# Patient Record
Sex: Male | Born: 1940 | Race: White | Hispanic: No | Marital: Married | Smoking: Never smoker
Health system: Southern US, Community
[De-identification: ages and names within clinical notes are randomized; demographics above are authoritative.]

## PROBLEM LIST (undated history)

## (undated) DIAGNOSIS — I219 Acute myocardial infarction, unspecified: Secondary | ICD-10-CM

## (undated) DIAGNOSIS — I4891 Unspecified atrial fibrillation: Secondary | ICD-10-CM

## (undated) DIAGNOSIS — B1920 Unspecified viral hepatitis C without hepatic coma: Secondary | ICD-10-CM

## (undated) DIAGNOSIS — I509 Heart failure, unspecified: Secondary | ICD-10-CM

## (undated) DIAGNOSIS — E119 Type 2 diabetes mellitus without complications: Secondary | ICD-10-CM

## (undated) DIAGNOSIS — I2510 Atherosclerotic heart disease of native coronary artery without angina pectoris: Secondary | ICD-10-CM

## (undated) DIAGNOSIS — F4310 Post-traumatic stress disorder, unspecified: Secondary | ICD-10-CM

## (undated) DIAGNOSIS — N19 Unspecified kidney failure: Secondary | ICD-10-CM

## (undated) DIAGNOSIS — Z8719 Personal history of other diseases of the digestive system: Secondary | ICD-10-CM

## (undated) DIAGNOSIS — Z95810 Presence of automatic (implantable) cardiac defibrillator: Secondary | ICD-10-CM

## (undated) DIAGNOSIS — F419 Anxiety disorder, unspecified: Secondary | ICD-10-CM

## (undated) DIAGNOSIS — B20 Human immunodeficiency virus [HIV] disease: Secondary | ICD-10-CM

## (undated) DIAGNOSIS — I10 Essential (primary) hypertension: Secondary | ICD-10-CM

## (undated) HISTORY — PX: APPENDECTOMY: SHX54

## (undated) HISTORY — DX: Unspecified atrial fibrillation: I48.91

## (undated) HISTORY — DX: Atherosclerotic heart disease of native coronary artery without angina pectoris: I25.10

## (undated) HISTORY — PX: OTHER SURGICAL HISTORY: SHX169

## (undated) HISTORY — PX: CHOLECYSTECTOMY: SHX55

## (undated) HISTORY — PX: EP IMPLANTABLE DEVICE: SHX172B

---

## 1962-10-20 HISTORY — PX: FRACTURE SURGERY: SHX138

## 1968-10-20 DIAGNOSIS — Z8719 Personal history of other diseases of the digestive system: Secondary | ICD-10-CM

## 1968-10-20 HISTORY — DX: Personal history of other diseases of the digestive system: Z87.19

## 2001-10-20 DIAGNOSIS — I219 Acute myocardial infarction, unspecified: Secondary | ICD-10-CM

## 2001-10-20 HISTORY — DX: Acute myocardial infarction, unspecified: I21.9

## 2002-04-22 ENCOUNTER — Encounter: Payer: Self-pay | Admitting: *Deleted

## 2002-04-22 ENCOUNTER — Inpatient Hospital Stay (HOSPITAL_COMMUNITY): Admission: EM | Admit: 2002-04-22 | Discharge: 2002-04-25 | Payer: Self-pay | Admitting: *Deleted

## 2002-11-27 ENCOUNTER — Emergency Department (HOSPITAL_COMMUNITY): Admission: EM | Admit: 2002-11-27 | Discharge: 2002-11-27 | Payer: Self-pay | Admitting: Emergency Medicine

## 2002-11-29 ENCOUNTER — Inpatient Hospital Stay (HOSPITAL_COMMUNITY): Admission: AD | Admit: 2002-11-29 | Discharge: 2002-12-02 | Payer: Self-pay | Admitting: Internal Medicine

## 2002-11-29 ENCOUNTER — Encounter: Payer: Self-pay | Admitting: Internal Medicine

## 2004-03-25 ENCOUNTER — Other Ambulatory Visit: Payer: Self-pay

## 2006-04-29 ENCOUNTER — Other Ambulatory Visit: Payer: Self-pay

## 2006-04-30 ENCOUNTER — Inpatient Hospital Stay: Payer: Self-pay | Admitting: Internal Medicine

## 2006-09-09 ENCOUNTER — Encounter: Payer: Self-pay | Admitting: Specialist

## 2006-09-19 ENCOUNTER — Encounter: Payer: Self-pay | Admitting: Specialist

## 2006-10-20 ENCOUNTER — Encounter: Payer: Self-pay | Admitting: Specialist

## 2006-10-22 ENCOUNTER — Ambulatory Visit: Payer: Self-pay | Admitting: Specialist

## 2006-11-20 ENCOUNTER — Encounter: Payer: Self-pay | Admitting: Specialist

## 2006-12-19 ENCOUNTER — Encounter: Payer: Self-pay | Admitting: Specialist

## 2007-01-19 ENCOUNTER — Encounter: Payer: Self-pay | Admitting: Specialist

## 2007-02-18 ENCOUNTER — Encounter: Payer: Self-pay | Admitting: Specialist

## 2007-08-25 ENCOUNTER — Encounter: Payer: Self-pay | Admitting: Internal Medicine

## 2007-09-20 ENCOUNTER — Encounter: Payer: Self-pay | Admitting: Internal Medicine

## 2007-10-21 ENCOUNTER — Encounter: Payer: Self-pay | Admitting: Internal Medicine

## 2007-11-21 ENCOUNTER — Encounter: Payer: Self-pay | Admitting: Internal Medicine

## 2007-12-19 ENCOUNTER — Encounter: Payer: Self-pay | Admitting: Internal Medicine

## 2008-01-19 ENCOUNTER — Encounter: Payer: Self-pay | Admitting: Internal Medicine

## 2008-02-18 ENCOUNTER — Encounter: Payer: Self-pay | Admitting: Internal Medicine

## 2008-03-20 ENCOUNTER — Encounter: Payer: Self-pay | Admitting: Internal Medicine

## 2008-03-28 ENCOUNTER — Ambulatory Visit: Payer: Self-pay | Admitting: Internal Medicine

## 2008-04-19 ENCOUNTER — Encounter: Payer: Self-pay | Admitting: Internal Medicine

## 2008-05-20 ENCOUNTER — Encounter: Payer: Self-pay | Admitting: Internal Medicine

## 2008-06-20 ENCOUNTER — Encounter: Payer: Self-pay | Admitting: Internal Medicine

## 2008-07-20 ENCOUNTER — Encounter: Payer: Self-pay | Admitting: Internal Medicine

## 2008-08-20 ENCOUNTER — Encounter: Payer: Self-pay | Admitting: Internal Medicine

## 2008-09-19 ENCOUNTER — Encounter: Payer: Self-pay | Admitting: Internal Medicine

## 2008-10-20 ENCOUNTER — Encounter: Payer: Self-pay | Admitting: Internal Medicine

## 2009-12-25 ENCOUNTER — Emergency Department: Payer: Self-pay | Admitting: Emergency Medicine

## 2010-01-03 ENCOUNTER — Emergency Department: Payer: Self-pay | Admitting: Emergency Medicine

## 2010-01-18 ENCOUNTER — Ambulatory Visit: Payer: Self-pay | Admitting: Cardiology

## 2010-10-24 ENCOUNTER — Ambulatory Visit: Payer: Self-pay

## 2010-12-19 ENCOUNTER — Encounter: Payer: Self-pay | Admitting: Cardiothoracic Surgery

## 2011-01-19 ENCOUNTER — Encounter: Payer: Self-pay | Admitting: Cardiothoracic Surgery

## 2011-02-18 ENCOUNTER — Encounter: Payer: Self-pay | Admitting: Cardiothoracic Surgery

## 2011-03-21 ENCOUNTER — Encounter: Payer: Self-pay | Admitting: Cardiothoracic Surgery

## 2011-04-20 ENCOUNTER — Encounter: Payer: Self-pay | Admitting: Cardiothoracic Surgery

## 2011-05-21 ENCOUNTER — Encounter: Payer: Self-pay | Admitting: Cardiothoracic Surgery

## 2011-06-20 ENCOUNTER — Encounter: Payer: Self-pay | Admitting: Nurse Practitioner

## 2011-06-21 ENCOUNTER — Encounter: Payer: Self-pay | Admitting: Cardiothoracic Surgery

## 2011-06-21 ENCOUNTER — Encounter: Payer: Self-pay | Admitting: Nurse Practitioner

## 2011-07-02 ENCOUNTER — Ambulatory Visit: Payer: Self-pay | Admitting: Nurse Practitioner

## 2011-07-07 ENCOUNTER — Inpatient Hospital Stay: Payer: Self-pay | Admitting: *Deleted

## 2011-07-21 ENCOUNTER — Encounter: Payer: Self-pay | Admitting: Cardiothoracic Surgery

## 2011-07-21 ENCOUNTER — Encounter: Payer: Self-pay | Admitting: Nurse Practitioner

## 2011-08-21 ENCOUNTER — Encounter: Payer: Self-pay | Admitting: Cardiothoracic Surgery

## 2011-09-20 ENCOUNTER — Encounter: Payer: Self-pay | Admitting: Cardiothoracic Surgery

## 2011-10-21 ENCOUNTER — Encounter: Payer: Self-pay | Admitting: Cardiothoracic Surgery

## 2011-10-21 DIAGNOSIS — I803 Phlebitis and thrombophlebitis of lower extremities, unspecified: Secondary | ICD-10-CM | POA: Diagnosis not present

## 2011-10-21 DIAGNOSIS — A4902 Methicillin resistant Staphylococcus aureus infection, unspecified site: Secondary | ICD-10-CM | POA: Diagnosis not present

## 2011-10-21 DIAGNOSIS — L97309 Non-pressure chronic ulcer of unspecified ankle with unspecified severity: Secondary | ICD-10-CM | POA: Diagnosis not present

## 2011-10-21 DIAGNOSIS — E119 Type 2 diabetes mellitus without complications: Secondary | ICD-10-CM | POA: Diagnosis not present

## 2011-10-21 DIAGNOSIS — I87319 Chronic venous hypertension (idiopathic) with ulcer of unspecified lower extremity: Secondary | ICD-10-CM | POA: Diagnosis not present

## 2011-10-23 DIAGNOSIS — I519 Heart disease, unspecified: Secondary | ICD-10-CM | POA: Diagnosis not present

## 2011-10-23 DIAGNOSIS — R002 Palpitations: Secondary | ICD-10-CM | POA: Diagnosis not present

## 2011-10-23 DIAGNOSIS — I2510 Atherosclerotic heart disease of native coronary artery without angina pectoris: Secondary | ICD-10-CM | POA: Diagnosis not present

## 2011-10-31 DIAGNOSIS — E11359 Type 2 diabetes mellitus with proliferative diabetic retinopathy without macular edema: Secondary | ICD-10-CM | POA: Diagnosis not present

## 2011-10-31 DIAGNOSIS — E1139 Type 2 diabetes mellitus with other diabetic ophthalmic complication: Secondary | ICD-10-CM | POA: Diagnosis not present

## 2011-10-31 DIAGNOSIS — E119 Type 2 diabetes mellitus without complications: Secondary | ICD-10-CM | POA: Diagnosis not present

## 2011-10-31 DIAGNOSIS — Z79899 Other long term (current) drug therapy: Secondary | ICD-10-CM | POA: Diagnosis not present

## 2011-10-31 DIAGNOSIS — I519 Heart disease, unspecified: Secondary | ICD-10-CM | POA: Diagnosis not present

## 2011-10-31 DIAGNOSIS — I4949 Other premature depolarization: Secondary | ICD-10-CM | POA: Diagnosis not present

## 2011-10-31 DIAGNOSIS — I2510 Atherosclerotic heart disease of native coronary artery without angina pectoris: Secondary | ICD-10-CM | POA: Diagnosis not present

## 2011-11-11 LAB — WOUND CULTURE

## 2011-11-21 ENCOUNTER — Encounter: Payer: Self-pay | Admitting: Cardiothoracic Surgery

## 2011-11-21 DIAGNOSIS — I803 Phlebitis and thrombophlebitis of lower extremities, unspecified: Secondary | ICD-10-CM | POA: Diagnosis not present

## 2011-11-21 DIAGNOSIS — E119 Type 2 diabetes mellitus without complications: Secondary | ICD-10-CM | POA: Diagnosis not present

## 2011-11-21 DIAGNOSIS — A4902 Methicillin resistant Staphylococcus aureus infection, unspecified site: Secondary | ICD-10-CM | POA: Diagnosis not present

## 2011-11-21 DIAGNOSIS — I87319 Chronic venous hypertension (idiopathic) with ulcer of unspecified lower extremity: Secondary | ICD-10-CM | POA: Diagnosis not present

## 2011-11-21 DIAGNOSIS — L97309 Non-pressure chronic ulcer of unspecified ankle with unspecified severity: Secondary | ICD-10-CM | POA: Diagnosis not present

## 2011-11-28 DIAGNOSIS — L97309 Non-pressure chronic ulcer of unspecified ankle with unspecified severity: Secondary | ICD-10-CM | POA: Diagnosis not present

## 2011-11-28 DIAGNOSIS — H4310 Vitreous hemorrhage, unspecified eye: Secondary | ICD-10-CM | POA: Diagnosis not present

## 2011-11-28 DIAGNOSIS — L97909 Non-pressure chronic ulcer of unspecified part of unspecified lower leg with unspecified severity: Secondary | ICD-10-CM | POA: Diagnosis not present

## 2011-11-28 DIAGNOSIS — A4902 Methicillin resistant Staphylococcus aureus infection, unspecified site: Secondary | ICD-10-CM | POA: Diagnosis not present

## 2011-11-28 DIAGNOSIS — E119 Type 2 diabetes mellitus without complications: Secondary | ICD-10-CM | POA: Diagnosis not present

## 2011-11-28 DIAGNOSIS — I87319 Chronic venous hypertension (idiopathic) with ulcer of unspecified lower extremity: Secondary | ICD-10-CM | POA: Diagnosis not present

## 2011-12-09 DIAGNOSIS — T8133XA Disruption of traumatic injury wound repair, initial encounter: Secondary | ICD-10-CM | POA: Diagnosis not present

## 2011-12-19 ENCOUNTER — Encounter: Payer: Self-pay | Admitting: Cardiothoracic Surgery

## 2011-12-19 DIAGNOSIS — E119 Type 2 diabetes mellitus without complications: Secondary | ICD-10-CM | POA: Diagnosis not present

## 2011-12-19 DIAGNOSIS — N189 Chronic kidney disease, unspecified: Secondary | ICD-10-CM | POA: Diagnosis not present

## 2011-12-19 DIAGNOSIS — A4902 Methicillin resistant Staphylococcus aureus infection, unspecified site: Secondary | ICD-10-CM | POA: Diagnosis not present

## 2011-12-19 DIAGNOSIS — L97909 Non-pressure chronic ulcer of unspecified part of unspecified lower leg with unspecified severity: Secondary | ICD-10-CM | POA: Diagnosis not present

## 2011-12-19 DIAGNOSIS — I87319 Chronic venous hypertension (idiopathic) with ulcer of unspecified lower extremity: Secondary | ICD-10-CM | POA: Diagnosis not present

## 2011-12-26 DIAGNOSIS — L97909 Non-pressure chronic ulcer of unspecified part of unspecified lower leg with unspecified severity: Secondary | ICD-10-CM | POA: Diagnosis not present

## 2011-12-26 DIAGNOSIS — I87319 Chronic venous hypertension (idiopathic) with ulcer of unspecified lower extremity: Secondary | ICD-10-CM | POA: Diagnosis not present

## 2011-12-29 DIAGNOSIS — E119 Type 2 diabetes mellitus without complications: Secondary | ICD-10-CM | POA: Diagnosis not present

## 2011-12-29 DIAGNOSIS — I517 Cardiomegaly: Secondary | ICD-10-CM | POA: Diagnosis not present

## 2011-12-29 DIAGNOSIS — I10 Essential (primary) hypertension: Secondary | ICD-10-CM | POA: Diagnosis not present

## 2012-01-02 DIAGNOSIS — E119 Type 2 diabetes mellitus without complications: Secondary | ICD-10-CM | POA: Diagnosis not present

## 2012-01-02 DIAGNOSIS — I87319 Chronic venous hypertension (idiopathic) with ulcer of unspecified lower extremity: Secondary | ICD-10-CM | POA: Diagnosis not present

## 2012-01-02 DIAGNOSIS — L97909 Non-pressure chronic ulcer of unspecified part of unspecified lower leg with unspecified severity: Secondary | ICD-10-CM | POA: Diagnosis not present

## 2012-01-02 DIAGNOSIS — N189 Chronic kidney disease, unspecified: Secondary | ICD-10-CM | POA: Diagnosis not present

## 2012-01-02 DIAGNOSIS — A4902 Methicillin resistant Staphylococcus aureus infection, unspecified site: Secondary | ICD-10-CM | POA: Diagnosis not present

## 2012-01-09 DIAGNOSIS — E11359 Type 2 diabetes mellitus with proliferative diabetic retinopathy without macular edema: Secondary | ICD-10-CM | POA: Diagnosis not present

## 2012-01-09 DIAGNOSIS — A4902 Methicillin resistant Staphylococcus aureus infection, unspecified site: Secondary | ICD-10-CM | POA: Diagnosis not present

## 2012-01-09 DIAGNOSIS — L97909 Non-pressure chronic ulcer of unspecified part of unspecified lower leg with unspecified severity: Secondary | ICD-10-CM | POA: Diagnosis not present

## 2012-01-09 DIAGNOSIS — I87319 Chronic venous hypertension (idiopathic) with ulcer of unspecified lower extremity: Secondary | ICD-10-CM | POA: Diagnosis not present

## 2012-01-09 DIAGNOSIS — E119 Type 2 diabetes mellitus without complications: Secondary | ICD-10-CM | POA: Diagnosis not present

## 2012-01-09 DIAGNOSIS — N189 Chronic kidney disease, unspecified: Secondary | ICD-10-CM | POA: Diagnosis not present

## 2012-01-09 DIAGNOSIS — E1139 Type 2 diabetes mellitus with other diabetic ophthalmic complication: Secondary | ICD-10-CM | POA: Diagnosis not present

## 2012-01-19 ENCOUNTER — Encounter: Payer: Self-pay | Admitting: Cardiothoracic Surgery

## 2012-01-19 DIAGNOSIS — E119 Type 2 diabetes mellitus without complications: Secondary | ICD-10-CM | POA: Diagnosis not present

## 2012-01-19 DIAGNOSIS — N189 Chronic kidney disease, unspecified: Secondary | ICD-10-CM | POA: Diagnosis not present

## 2012-01-19 DIAGNOSIS — I87319 Chronic venous hypertension (idiopathic) with ulcer of unspecified lower extremity: Secondary | ICD-10-CM | POA: Diagnosis not present

## 2012-01-19 DIAGNOSIS — I2510 Atherosclerotic heart disease of native coronary artery without angina pectoris: Secondary | ICD-10-CM | POA: Diagnosis not present

## 2012-01-19 DIAGNOSIS — I6529 Occlusion and stenosis of unspecified carotid artery: Secondary | ICD-10-CM | POA: Diagnosis not present

## 2012-01-19 DIAGNOSIS — I4949 Other premature depolarization: Secondary | ICD-10-CM | POA: Diagnosis not present

## 2012-01-19 DIAGNOSIS — A4902 Methicillin resistant Staphylococcus aureus infection, unspecified site: Secondary | ICD-10-CM | POA: Diagnosis not present

## 2012-01-23 DIAGNOSIS — E119 Type 2 diabetes mellitus without complications: Secondary | ICD-10-CM | POA: Diagnosis not present

## 2012-01-23 DIAGNOSIS — I87319 Chronic venous hypertension (idiopathic) with ulcer of unspecified lower extremity: Secondary | ICD-10-CM | POA: Diagnosis not present

## 2012-01-23 DIAGNOSIS — N189 Chronic kidney disease, unspecified: Secondary | ICD-10-CM | POA: Diagnosis not present

## 2012-01-23 DIAGNOSIS — A4902 Methicillin resistant Staphylococcus aureus infection, unspecified site: Secondary | ICD-10-CM | POA: Diagnosis not present

## 2012-01-23 DIAGNOSIS — L97909 Non-pressure chronic ulcer of unspecified part of unspecified lower leg with unspecified severity: Secondary | ICD-10-CM | POA: Diagnosis not present

## 2012-01-27 DIAGNOSIS — I2510 Atherosclerotic heart disease of native coronary artery without angina pectoris: Secondary | ICD-10-CM | POA: Diagnosis not present

## 2012-01-27 DIAGNOSIS — R002 Palpitations: Secondary | ICD-10-CM | POA: Diagnosis not present

## 2012-01-27 DIAGNOSIS — I519 Heart disease, unspecified: Secondary | ICD-10-CM | POA: Diagnosis not present

## 2012-01-30 DIAGNOSIS — B20 Human immunodeficiency virus [HIV] disease: Secondary | ICD-10-CM | POA: Diagnosis not present

## 2012-01-30 DIAGNOSIS — E118 Type 2 diabetes mellitus with unspecified complications: Secondary | ICD-10-CM | POA: Diagnosis not present

## 2012-01-30 DIAGNOSIS — Z79899 Other long term (current) drug therapy: Secondary | ICD-10-CM | POA: Diagnosis not present

## 2012-02-01 ENCOUNTER — Emergency Department: Payer: Self-pay | Admitting: Emergency Medicine

## 2012-02-01 DIAGNOSIS — Z7982 Long term (current) use of aspirin: Secondary | ICD-10-CM | POA: Diagnosis not present

## 2012-02-01 DIAGNOSIS — E876 Hypokalemia: Secondary | ICD-10-CM | POA: Diagnosis not present

## 2012-02-01 DIAGNOSIS — R55 Syncope and collapse: Secondary | ICD-10-CM | POA: Diagnosis not present

## 2012-02-01 DIAGNOSIS — E119 Type 2 diabetes mellitus without complications: Secondary | ICD-10-CM | POA: Diagnosis not present

## 2012-02-01 DIAGNOSIS — I252 Old myocardial infarction: Secondary | ICD-10-CM | POA: Diagnosis not present

## 2012-02-01 DIAGNOSIS — Z8673 Personal history of transient ischemic attack (TIA), and cerebral infarction without residual deficits: Secondary | ICD-10-CM | POA: Diagnosis not present

## 2012-02-01 DIAGNOSIS — E875 Hyperkalemia: Secondary | ICD-10-CM | POA: Diagnosis not present

## 2012-02-01 DIAGNOSIS — I2510 Atherosclerotic heart disease of native coronary artery without angina pectoris: Secondary | ICD-10-CM | POA: Diagnosis not present

## 2012-02-01 DIAGNOSIS — E860 Dehydration: Secondary | ICD-10-CM | POA: Diagnosis not present

## 2012-02-01 DIAGNOSIS — Z79899 Other long term (current) drug therapy: Secondary | ICD-10-CM | POA: Diagnosis not present

## 2012-02-01 DIAGNOSIS — R112 Nausea with vomiting, unspecified: Secondary | ICD-10-CM | POA: Diagnosis not present

## 2012-02-01 LAB — COMPREHENSIVE METABOLIC PANEL
Albumin: 3.5 g/dL (ref 3.4–5.0)
Alkaline Phosphatase: 130 U/L (ref 50–136)
Anion Gap: 9 (ref 7–16)
BUN: 23 mg/dL — ABNORMAL HIGH (ref 7–18)
Bilirubin,Total: 3.9 mg/dL — ABNORMAL HIGH (ref 0.2–1.0)
Chloride: 103 mmol/L (ref 98–107)
Creatinine: 1.33 mg/dL — ABNORMAL HIGH (ref 0.60–1.30)
EGFR (African American): >60
Potassium: 5.6 mmol/L — ABNORMAL HIGH (ref 3.5–5.1)
SGOT(AST): 38 U/L — ABNORMAL HIGH (ref 15–37)
Sodium: 136 mmol/L (ref 136–145)
Total Protein: 7 g/dL (ref 6.4–8.2)

## 2012-02-01 LAB — URINALYSIS, COMPLETE
Bacteria: NONE SEEN
Glucose,UR: >=500 mg/dL (ref 0–75)
Nitrite: NEGATIVE
RBC,UR: 2 /HPF (ref 0–5)
Specific Gravity: 1.014 (ref 1.003–1.030)
Squamous Epithelial: NONE SEEN

## 2012-02-01 LAB — CBC
HCT: 37.5 % — ABNORMAL LOW (ref 40.0–52.0)
HGB: 12.8 g/dL — ABNORMAL LOW (ref 13.0–18.0)
MCHC: 34.2 g/dL (ref 32.0–36.0)
MCV: 115 fL — ABNORMAL HIGH (ref 80–100)
Platelet: 97 10*3/uL — ABNORMAL LOW (ref 150–440)
RDW: 13.6 % (ref 11.5–14.5)
WBC: 7.3 10*3/uL (ref 3.8–10.6)

## 2012-02-01 LAB — TROPONIN I: Troponin-I: <0.02 ng/mL

## 2012-02-01 LAB — CK TOTAL AND CKMB (NOT AT ARMC): CK, Total: 75 U/L (ref 35–232)

## 2012-02-02 DIAGNOSIS — I2510 Atherosclerotic heart disease of native coronary artery without angina pectoris: Secondary | ICD-10-CM | POA: Diagnosis not present

## 2012-02-02 DIAGNOSIS — R55 Syncope and collapse: Secondary | ICD-10-CM | POA: Diagnosis not present

## 2012-02-02 DIAGNOSIS — I4949 Other premature depolarization: Secondary | ICD-10-CM | POA: Diagnosis not present

## 2012-02-02 DIAGNOSIS — I519 Heart disease, unspecified: Secondary | ICD-10-CM | POA: Diagnosis not present

## 2012-02-06 ENCOUNTER — Ambulatory Visit: Payer: Self-pay | Admitting: Cardiology

## 2012-02-06 DIAGNOSIS — I87319 Chronic venous hypertension (idiopathic) with ulcer of unspecified lower extremity: Secondary | ICD-10-CM | POA: Diagnosis not present

## 2012-02-06 DIAGNOSIS — R0602 Shortness of breath: Secondary | ICD-10-CM | POA: Diagnosis not present

## 2012-02-06 DIAGNOSIS — N189 Chronic kidney disease, unspecified: Secondary | ICD-10-CM | POA: Diagnosis not present

## 2012-02-06 DIAGNOSIS — L97909 Non-pressure chronic ulcer of unspecified part of unspecified lower leg with unspecified severity: Secondary | ICD-10-CM | POA: Diagnosis not present

## 2012-02-06 DIAGNOSIS — I658 Occlusion and stenosis of other precerebral arteries: Secondary | ICD-10-CM | POA: Diagnosis not present

## 2012-02-06 DIAGNOSIS — A4902 Methicillin resistant Staphylococcus aureus infection, unspecified site: Secondary | ICD-10-CM | POA: Diagnosis not present

## 2012-02-06 DIAGNOSIS — E119 Type 2 diabetes mellitus without complications: Secondary | ICD-10-CM | POA: Diagnosis not present

## 2012-02-06 DIAGNOSIS — I2510 Atherosclerotic heart disease of native coronary artery without angina pectoris: Secondary | ICD-10-CM | POA: Diagnosis not present

## 2012-02-06 DIAGNOSIS — I6529 Occlusion and stenosis of unspecified carotid artery: Secondary | ICD-10-CM | POA: Diagnosis not present

## 2012-02-10 DIAGNOSIS — E785 Hyperlipidemia, unspecified: Secondary | ICD-10-CM | POA: Diagnosis not present

## 2012-02-10 DIAGNOSIS — B20 Human immunodeficiency virus [HIV] disease: Secondary | ICD-10-CM | POA: Diagnosis not present

## 2012-02-10 DIAGNOSIS — E118 Type 2 diabetes mellitus with unspecified complications: Secondary | ICD-10-CM | POA: Diagnosis not present

## 2012-02-18 ENCOUNTER — Encounter: Payer: Self-pay | Admitting: Cardiothoracic Surgery

## 2012-02-18 DIAGNOSIS — A4902 Methicillin resistant Staphylococcus aureus infection, unspecified site: Secondary | ICD-10-CM | POA: Diagnosis not present

## 2012-02-18 DIAGNOSIS — E119 Type 2 diabetes mellitus without complications: Secondary | ICD-10-CM | POA: Diagnosis not present

## 2012-02-18 DIAGNOSIS — N189 Chronic kidney disease, unspecified: Secondary | ICD-10-CM | POA: Diagnosis not present

## 2012-02-18 DIAGNOSIS — I87319 Chronic venous hypertension (idiopathic) with ulcer of unspecified lower extremity: Secondary | ICD-10-CM | POA: Diagnosis not present

## 2012-02-20 DIAGNOSIS — A4902 Methicillin resistant Staphylococcus aureus infection, unspecified site: Secondary | ICD-10-CM | POA: Diagnosis not present

## 2012-02-20 DIAGNOSIS — L97909 Non-pressure chronic ulcer of unspecified part of unspecified lower leg with unspecified severity: Secondary | ICD-10-CM | POA: Diagnosis not present

## 2012-02-20 DIAGNOSIS — E119 Type 2 diabetes mellitus without complications: Secondary | ICD-10-CM | POA: Diagnosis not present

## 2012-02-20 DIAGNOSIS — N189 Chronic kidney disease, unspecified: Secondary | ICD-10-CM | POA: Diagnosis not present

## 2012-02-20 DIAGNOSIS — I87319 Chronic venous hypertension (idiopathic) with ulcer of unspecified lower extremity: Secondary | ICD-10-CM | POA: Diagnosis not present

## 2012-02-27 DIAGNOSIS — I87319 Chronic venous hypertension (idiopathic) with ulcer of unspecified lower extremity: Secondary | ICD-10-CM | POA: Diagnosis not present

## 2012-02-27 DIAGNOSIS — E119 Type 2 diabetes mellitus without complications: Secondary | ICD-10-CM | POA: Diagnosis not present

## 2012-02-27 DIAGNOSIS — A4902 Methicillin resistant Staphylococcus aureus infection, unspecified site: Secondary | ICD-10-CM | POA: Diagnosis not present

## 2012-02-27 DIAGNOSIS — N189 Chronic kidney disease, unspecified: Secondary | ICD-10-CM | POA: Diagnosis not present

## 2012-02-27 DIAGNOSIS — L97909 Non-pressure chronic ulcer of unspecified part of unspecified lower leg with unspecified severity: Secondary | ICD-10-CM | POA: Diagnosis not present

## 2012-03-05 DIAGNOSIS — I87319 Chronic venous hypertension (idiopathic) with ulcer of unspecified lower extremity: Secondary | ICD-10-CM | POA: Diagnosis not present

## 2012-03-05 DIAGNOSIS — E119 Type 2 diabetes mellitus without complications: Secondary | ICD-10-CM | POA: Diagnosis not present

## 2012-03-05 DIAGNOSIS — N189 Chronic kidney disease, unspecified: Secondary | ICD-10-CM | POA: Diagnosis not present

## 2012-03-05 DIAGNOSIS — L97909 Non-pressure chronic ulcer of unspecified part of unspecified lower leg with unspecified severity: Secondary | ICD-10-CM | POA: Diagnosis not present

## 2012-03-05 DIAGNOSIS — E1139 Type 2 diabetes mellitus with other diabetic ophthalmic complication: Secondary | ICD-10-CM | POA: Diagnosis not present

## 2012-03-05 DIAGNOSIS — E11359 Type 2 diabetes mellitus with proliferative diabetic retinopathy without macular edema: Secondary | ICD-10-CM | POA: Diagnosis not present

## 2012-03-05 DIAGNOSIS — A4902 Methicillin resistant Staphylococcus aureus infection, unspecified site: Secondary | ICD-10-CM | POA: Diagnosis not present

## 2012-03-10 DIAGNOSIS — H2510 Age-related nuclear cataract, unspecified eye: Secondary | ICD-10-CM | POA: Diagnosis not present

## 2012-03-12 DIAGNOSIS — I87319 Chronic venous hypertension (idiopathic) with ulcer of unspecified lower extremity: Secondary | ICD-10-CM | POA: Diagnosis not present

## 2012-03-12 DIAGNOSIS — A4902 Methicillin resistant Staphylococcus aureus infection, unspecified site: Secondary | ICD-10-CM | POA: Diagnosis not present

## 2012-03-12 DIAGNOSIS — E119 Type 2 diabetes mellitus without complications: Secondary | ICD-10-CM | POA: Diagnosis not present

## 2012-03-12 DIAGNOSIS — N189 Chronic kidney disease, unspecified: Secondary | ICD-10-CM | POA: Diagnosis not present

## 2012-03-17 LAB — WOUND CULTURE

## 2012-03-20 ENCOUNTER — Encounter: Payer: Self-pay | Admitting: Cardiothoracic Surgery

## 2012-03-20 DIAGNOSIS — A4902 Methicillin resistant Staphylococcus aureus infection, unspecified site: Secondary | ICD-10-CM | POA: Diagnosis not present

## 2012-03-20 DIAGNOSIS — E119 Type 2 diabetes mellitus without complications: Secondary | ICD-10-CM | POA: Diagnosis not present

## 2012-03-20 DIAGNOSIS — N189 Chronic kidney disease, unspecified: Secondary | ICD-10-CM | POA: Diagnosis not present

## 2012-03-20 DIAGNOSIS — I87319 Chronic venous hypertension (idiopathic) with ulcer of unspecified lower extremity: Secondary | ICD-10-CM | POA: Diagnosis not present

## 2012-04-02 DIAGNOSIS — E119 Type 2 diabetes mellitus without complications: Secondary | ICD-10-CM | POA: Diagnosis not present

## 2012-04-02 DIAGNOSIS — A4902 Methicillin resistant Staphylococcus aureus infection, unspecified site: Secondary | ICD-10-CM | POA: Diagnosis not present

## 2012-04-02 DIAGNOSIS — I87319 Chronic venous hypertension (idiopathic) with ulcer of unspecified lower extremity: Secondary | ICD-10-CM | POA: Diagnosis not present

## 2012-04-02 DIAGNOSIS — L97909 Non-pressure chronic ulcer of unspecified part of unspecified lower leg with unspecified severity: Secondary | ICD-10-CM | POA: Diagnosis not present

## 2012-04-02 DIAGNOSIS — N189 Chronic kidney disease, unspecified: Secondary | ICD-10-CM | POA: Diagnosis not present

## 2012-04-09 DIAGNOSIS — E119 Type 2 diabetes mellitus without complications: Secondary | ICD-10-CM | POA: Diagnosis not present

## 2012-04-09 DIAGNOSIS — I87319 Chronic venous hypertension (idiopathic) with ulcer of unspecified lower extremity: Secondary | ICD-10-CM | POA: Diagnosis not present

## 2012-04-09 DIAGNOSIS — N189 Chronic kidney disease, unspecified: Secondary | ICD-10-CM | POA: Diagnosis not present

## 2012-04-09 DIAGNOSIS — L97909 Non-pressure chronic ulcer of unspecified part of unspecified lower leg with unspecified severity: Secondary | ICD-10-CM | POA: Diagnosis not present

## 2012-04-09 DIAGNOSIS — A4902 Methicillin resistant Staphylococcus aureus infection, unspecified site: Secondary | ICD-10-CM | POA: Diagnosis not present

## 2012-04-19 ENCOUNTER — Encounter: Payer: Self-pay | Admitting: Cardiothoracic Surgery

## 2012-04-19 DIAGNOSIS — A4902 Methicillin resistant Staphylococcus aureus infection, unspecified site: Secondary | ICD-10-CM | POA: Diagnosis not present

## 2012-04-19 DIAGNOSIS — E119 Type 2 diabetes mellitus without complications: Secondary | ICD-10-CM | POA: Diagnosis not present

## 2012-04-19 DIAGNOSIS — I87319 Chronic venous hypertension (idiopathic) with ulcer of unspecified lower extremity: Secondary | ICD-10-CM | POA: Diagnosis not present

## 2012-04-19 DIAGNOSIS — N189 Chronic kidney disease, unspecified: Secondary | ICD-10-CM | POA: Diagnosis not present

## 2012-04-26 DIAGNOSIS — I10 Essential (primary) hypertension: Secondary | ICD-10-CM | POA: Diagnosis not present

## 2012-04-26 DIAGNOSIS — E119 Type 2 diabetes mellitus without complications: Secondary | ICD-10-CM | POA: Diagnosis not present

## 2012-04-30 ENCOUNTER — Encounter: Payer: Self-pay | Admitting: Nurse Practitioner

## 2012-04-30 DIAGNOSIS — I87319 Chronic venous hypertension (idiopathic) with ulcer of unspecified lower extremity: Secondary | ICD-10-CM | POA: Diagnosis not present

## 2012-04-30 DIAGNOSIS — L97909 Non-pressure chronic ulcer of unspecified part of unspecified lower leg with unspecified severity: Secondary | ICD-10-CM | POA: Diagnosis not present

## 2012-04-30 DIAGNOSIS — E119 Type 2 diabetes mellitus without complications: Secondary | ICD-10-CM | POA: Diagnosis not present

## 2012-04-30 DIAGNOSIS — A4902 Methicillin resistant Staphylococcus aureus infection, unspecified site: Secondary | ICD-10-CM | POA: Diagnosis not present

## 2012-04-30 DIAGNOSIS — N189 Chronic kidney disease, unspecified: Secondary | ICD-10-CM | POA: Diagnosis not present

## 2012-05-04 LAB — WOUND CULTURE

## 2012-05-07 DIAGNOSIS — I2510 Atherosclerotic heart disease of native coronary artery without angina pectoris: Secondary | ICD-10-CM | POA: Diagnosis not present

## 2012-05-07 DIAGNOSIS — I4949 Other premature depolarization: Secondary | ICD-10-CM | POA: Diagnosis not present

## 2012-05-07 DIAGNOSIS — I519 Heart disease, unspecified: Secondary | ICD-10-CM | POA: Diagnosis not present

## 2012-05-12 DIAGNOSIS — S91009A Unspecified open wound, unspecified ankle, initial encounter: Secondary | ICD-10-CM | POA: Diagnosis not present

## 2012-05-12 DIAGNOSIS — S81009A Unspecified open wound, unspecified knee, initial encounter: Secondary | ICD-10-CM | POA: Diagnosis not present

## 2012-05-14 DIAGNOSIS — E119 Type 2 diabetes mellitus without complications: Secondary | ICD-10-CM | POA: Diagnosis not present

## 2012-05-14 DIAGNOSIS — N189 Chronic kidney disease, unspecified: Secondary | ICD-10-CM | POA: Diagnosis not present

## 2012-05-14 DIAGNOSIS — A4902 Methicillin resistant Staphylococcus aureus infection, unspecified site: Secondary | ICD-10-CM | POA: Diagnosis not present

## 2012-05-14 DIAGNOSIS — I87319 Chronic venous hypertension (idiopathic) with ulcer of unspecified lower extremity: Secondary | ICD-10-CM | POA: Diagnosis not present

## 2012-05-14 DIAGNOSIS — L97909 Non-pressure chronic ulcer of unspecified part of unspecified lower leg with unspecified severity: Secondary | ICD-10-CM | POA: Diagnosis not present

## 2012-05-20 ENCOUNTER — Encounter: Payer: Self-pay | Admitting: Cardiothoracic Surgery

## 2012-05-20 ENCOUNTER — Encounter: Payer: Self-pay | Admitting: Nurse Practitioner

## 2012-05-20 DIAGNOSIS — A4902 Methicillin resistant Staphylococcus aureus infection, unspecified site: Secondary | ICD-10-CM | POA: Diagnosis not present

## 2012-05-20 DIAGNOSIS — N189 Chronic kidney disease, unspecified: Secondary | ICD-10-CM | POA: Diagnosis not present

## 2012-05-20 DIAGNOSIS — E119 Type 2 diabetes mellitus without complications: Secondary | ICD-10-CM | POA: Diagnosis not present

## 2012-05-20 DIAGNOSIS — I87319 Chronic venous hypertension (idiopathic) with ulcer of unspecified lower extremity: Secondary | ICD-10-CM | POA: Diagnosis not present

## 2012-05-26 DIAGNOSIS — S81809A Unspecified open wound, unspecified lower leg, initial encounter: Secondary | ICD-10-CM | POA: Diagnosis not present

## 2012-05-26 DIAGNOSIS — L97309 Non-pressure chronic ulcer of unspecified ankle with unspecified severity: Secondary | ICD-10-CM | POA: Diagnosis not present

## 2012-05-26 DIAGNOSIS — S81009A Unspecified open wound, unspecified knee, initial encounter: Secondary | ICD-10-CM | POA: Diagnosis not present

## 2012-05-26 DIAGNOSIS — S91009A Unspecified open wound, unspecified ankle, initial encounter: Secondary | ICD-10-CM | POA: Diagnosis not present

## 2012-05-26 DIAGNOSIS — R229 Localized swelling, mass and lump, unspecified: Secondary | ICD-10-CM | POA: Diagnosis not present

## 2012-05-27 DIAGNOSIS — R0989 Other specified symptoms and signs involving the circulatory and respiratory systems: Secondary | ICD-10-CM | POA: Diagnosis not present

## 2012-05-27 DIAGNOSIS — R002 Palpitations: Secondary | ICD-10-CM | POA: Diagnosis not present

## 2012-05-28 DIAGNOSIS — E119 Type 2 diabetes mellitus without complications: Secondary | ICD-10-CM | POA: Diagnosis not present

## 2012-05-28 DIAGNOSIS — I87319 Chronic venous hypertension (idiopathic) with ulcer of unspecified lower extremity: Secondary | ICD-10-CM | POA: Diagnosis not present

## 2012-05-28 DIAGNOSIS — N189 Chronic kidney disease, unspecified: Secondary | ICD-10-CM | POA: Diagnosis not present

## 2012-05-28 DIAGNOSIS — I2510 Atherosclerotic heart disease of native coronary artery without angina pectoris: Secondary | ICD-10-CM | POA: Diagnosis not present

## 2012-05-28 DIAGNOSIS — I519 Heart disease, unspecified: Secondary | ICD-10-CM | POA: Diagnosis not present

## 2012-05-28 DIAGNOSIS — L97909 Non-pressure chronic ulcer of unspecified part of unspecified lower leg with unspecified severity: Secondary | ICD-10-CM | POA: Diagnosis not present

## 2012-05-28 DIAGNOSIS — I4949 Other premature depolarization: Secondary | ICD-10-CM | POA: Diagnosis not present

## 2012-05-28 DIAGNOSIS — A4902 Methicillin resistant Staphylococcus aureus infection, unspecified site: Secondary | ICD-10-CM | POA: Diagnosis not present

## 2012-06-15 DIAGNOSIS — E1139 Type 2 diabetes mellitus with other diabetic ophthalmic complication: Secondary | ICD-10-CM | POA: Diagnosis not present

## 2012-06-15 DIAGNOSIS — E11359 Type 2 diabetes mellitus with proliferative diabetic retinopathy without macular edema: Secondary | ICD-10-CM | POA: Diagnosis not present

## 2012-06-18 DIAGNOSIS — N189 Chronic kidney disease, unspecified: Secondary | ICD-10-CM | POA: Diagnosis not present

## 2012-06-18 DIAGNOSIS — L97909 Non-pressure chronic ulcer of unspecified part of unspecified lower leg with unspecified severity: Secondary | ICD-10-CM | POA: Diagnosis not present

## 2012-06-18 DIAGNOSIS — E119 Type 2 diabetes mellitus without complications: Secondary | ICD-10-CM | POA: Diagnosis not present

## 2012-06-18 DIAGNOSIS — I87319 Chronic venous hypertension (idiopathic) with ulcer of unspecified lower extremity: Secondary | ICD-10-CM | POA: Diagnosis not present

## 2012-06-18 DIAGNOSIS — A4902 Methicillin resistant Staphylococcus aureus infection, unspecified site: Secondary | ICD-10-CM | POA: Diagnosis not present

## 2012-06-20 ENCOUNTER — Encounter: Payer: Self-pay | Admitting: Cardiothoracic Surgery

## 2012-06-20 DIAGNOSIS — A4902 Methicillin resistant Staphylococcus aureus infection, unspecified site: Secondary | ICD-10-CM | POA: Diagnosis not present

## 2012-06-20 DIAGNOSIS — I87319 Chronic venous hypertension (idiopathic) with ulcer of unspecified lower extremity: Secondary | ICD-10-CM | POA: Diagnosis not present

## 2012-06-20 DIAGNOSIS — E119 Type 2 diabetes mellitus without complications: Secondary | ICD-10-CM | POA: Diagnosis not present

## 2012-06-20 DIAGNOSIS — N189 Chronic kidney disease, unspecified: Secondary | ICD-10-CM | POA: Diagnosis not present

## 2012-06-23 DIAGNOSIS — R9431 Abnormal electrocardiogram [ECG] [EKG]: Secondary | ICD-10-CM | POA: Diagnosis not present

## 2012-06-23 DIAGNOSIS — R7989 Other specified abnormal findings of blood chemistry: Secondary | ICD-10-CM | POA: Diagnosis not present

## 2012-06-23 DIAGNOSIS — Z01818 Encounter for other preprocedural examination: Secondary | ICD-10-CM | POA: Diagnosis not present

## 2012-06-23 DIAGNOSIS — I498 Other specified cardiac arrhythmias: Secondary | ICD-10-CM | POA: Diagnosis not present

## 2012-06-23 DIAGNOSIS — Z01812 Encounter for preprocedural laboratory examination: Secondary | ICD-10-CM | POA: Diagnosis not present

## 2012-06-23 DIAGNOSIS — M629 Disorder of muscle, unspecified: Secondary | ICD-10-CM | POA: Diagnosis not present

## 2012-07-20 ENCOUNTER — Encounter: Payer: Self-pay | Admitting: Cardiothoracic Surgery

## 2012-07-20 ENCOUNTER — Encounter: Payer: Self-pay | Admitting: Nurse Practitioner

## 2012-07-20 DIAGNOSIS — N189 Chronic kidney disease, unspecified: Secondary | ICD-10-CM | POA: Diagnosis not present

## 2012-07-20 DIAGNOSIS — A4902 Methicillin resistant Staphylococcus aureus infection, unspecified site: Secondary | ICD-10-CM | POA: Diagnosis not present

## 2012-07-20 DIAGNOSIS — E119 Type 2 diabetes mellitus without complications: Secondary | ICD-10-CM | POA: Diagnosis not present

## 2012-07-20 DIAGNOSIS — I87319 Chronic venous hypertension (idiopathic) with ulcer of unspecified lower extremity: Secondary | ICD-10-CM | POA: Diagnosis not present

## 2012-07-27 DIAGNOSIS — I517 Cardiomegaly: Secondary | ICD-10-CM | POA: Diagnosis not present

## 2012-07-27 DIAGNOSIS — I10 Essential (primary) hypertension: Secondary | ICD-10-CM | POA: Diagnosis not present

## 2012-07-27 DIAGNOSIS — E119 Type 2 diabetes mellitus without complications: Secondary | ICD-10-CM | POA: Diagnosis not present

## 2012-07-27 DIAGNOSIS — J00 Acute nasopharyngitis [common cold]: Secondary | ICD-10-CM | POA: Diagnosis not present

## 2012-07-30 ENCOUNTER — Observation Stay: Payer: Self-pay | Admitting: Internal Medicine

## 2012-07-30 DIAGNOSIS — R0789 Other chest pain: Secondary | ICD-10-CM | POA: Diagnosis not present

## 2012-07-30 DIAGNOSIS — N183 Chronic kidney disease, stage 3 unspecified: Secondary | ICD-10-CM | POA: Diagnosis not present

## 2012-07-30 DIAGNOSIS — Z9089 Acquired absence of other organs: Secondary | ICD-10-CM | POA: Diagnosis not present

## 2012-07-30 DIAGNOSIS — I2510 Atherosclerotic heart disease of native coronary artery without angina pectoris: Secondary | ICD-10-CM | POA: Diagnosis not present

## 2012-07-30 DIAGNOSIS — R52 Pain, unspecified: Secondary | ICD-10-CM | POA: Diagnosis not present

## 2012-07-30 DIAGNOSIS — L97509 Non-pressure chronic ulcer of other part of unspecified foot with unspecified severity: Secondary | ICD-10-CM | POA: Diagnosis not present

## 2012-07-30 DIAGNOSIS — Z7982 Long term (current) use of aspirin: Secondary | ICD-10-CM | POA: Diagnosis not present

## 2012-07-30 DIAGNOSIS — I509 Heart failure, unspecified: Secondary | ICD-10-CM | POA: Diagnosis not present

## 2012-07-30 DIAGNOSIS — R079 Chest pain, unspecified: Secondary | ICD-10-CM | POA: Diagnosis not present

## 2012-07-30 DIAGNOSIS — E875 Hyperkalemia: Secondary | ICD-10-CM | POA: Diagnosis not present

## 2012-07-30 DIAGNOSIS — T889XXS Complication of surgical and medical care, unspecified, sequela: Secondary | ICD-10-CM | POA: Diagnosis not present

## 2012-07-30 DIAGNOSIS — I129 Hypertensive chronic kidney disease with stage 1 through stage 4 chronic kidney disease, or unspecified chronic kidney disease: Secondary | ICD-10-CM | POA: Diagnosis not present

## 2012-07-30 DIAGNOSIS — B20 Human immunodeficiency virus [HIV] disease: Secondary | ICD-10-CM | POA: Diagnosis not present

## 2012-07-30 DIAGNOSIS — Z7902 Long term (current) use of antithrombotics/antiplatelets: Secondary | ICD-10-CM | POA: Diagnosis not present

## 2012-07-30 DIAGNOSIS — J209 Acute bronchitis, unspecified: Secondary | ICD-10-CM | POA: Diagnosis not present

## 2012-07-30 DIAGNOSIS — Z87891 Personal history of nicotine dependence: Secondary | ICD-10-CM | POA: Diagnosis not present

## 2012-07-30 DIAGNOSIS — E119 Type 2 diabetes mellitus without complications: Secondary | ICD-10-CM | POA: Diagnosis not present

## 2012-07-30 DIAGNOSIS — R112 Nausea with vomiting, unspecified: Secondary | ICD-10-CM | POA: Diagnosis not present

## 2012-07-30 DIAGNOSIS — I5022 Chronic systolic (congestive) heart failure: Secondary | ICD-10-CM | POA: Diagnosis not present

## 2012-07-30 LAB — COMPREHENSIVE METABOLIC PANEL
Albumin: 3.1 g/dL — ABNORMAL LOW (ref 3.4–5.0)
Anion Gap: 7 (ref 7–16)
BUN: 17 mg/dL (ref 7–18)
Bilirubin,Total: 3.2 mg/dL — ABNORMAL HIGH (ref 0.2–1.0)
Chloride: 105 mmol/L (ref 98–107)
Co2: 25 mmol/L (ref 21–32)
EGFR (African American): 60 — ABNORMAL LOW
EGFR (Non-African Amer.): 52 — ABNORMAL LOW
Glucose: 216 mg/dL — ABNORMAL HIGH (ref 65–99)
Potassium: 5.3 mmol/L — ABNORMAL HIGH (ref 3.5–5.1)
SGOT(AST): 20 U/L (ref 15–37)
SGPT (ALT): 31 U/L (ref 12–78)
Sodium: 137 mmol/L (ref 136–145)
Total Protein: 6.8 g/dL (ref 6.4–8.2)

## 2012-07-30 LAB — CBC
MCH: 40.2 pg — ABNORMAL HIGH (ref 26.0–34.0)
MCHC: 35.8 g/dL (ref 32.0–36.0)
Platelet: 85 10*3/uL — ABNORMAL LOW (ref 150–440)
RBC: 3.11 10*6/uL — ABNORMAL LOW (ref 4.40–5.90)

## 2012-07-30 LAB — TROPONIN I
Troponin-I: <0.02 ng/mL
Troponin-I: <0.02 ng/mL
Troponin-I: <0.02 ng/mL

## 2012-07-30 LAB — CK TOTAL AND CKMB (NOT AT ARMC): CK-MB: 1.1 ng/mL (ref 0.5–3.6)

## 2012-07-30 LAB — POTASSIUM: Potassium: 5.3 mmol/L — ABNORMAL HIGH (ref 3.5–5.1)

## 2012-07-31 DIAGNOSIS — J209 Acute bronchitis, unspecified: Secondary | ICD-10-CM | POA: Diagnosis not present

## 2012-07-31 DIAGNOSIS — R079 Chest pain, unspecified: Secondary | ICD-10-CM | POA: Diagnosis not present

## 2012-07-31 DIAGNOSIS — E875 Hyperkalemia: Secondary | ICD-10-CM | POA: Diagnosis not present

## 2012-07-31 DIAGNOSIS — I5022 Chronic systolic (congestive) heart failure: Secondary | ICD-10-CM | POA: Diagnosis not present

## 2012-08-01 DIAGNOSIS — I509 Heart failure, unspecified: Secondary | ICD-10-CM | POA: Diagnosis not present

## 2012-08-02 DIAGNOSIS — A4902 Methicillin resistant Staphylococcus aureus infection, unspecified site: Secondary | ICD-10-CM | POA: Diagnosis not present

## 2012-08-02 DIAGNOSIS — I87319 Chronic venous hypertension (idiopathic) with ulcer of unspecified lower extremity: Secondary | ICD-10-CM | POA: Diagnosis not present

## 2012-08-02 DIAGNOSIS — E119 Type 2 diabetes mellitus without complications: Secondary | ICD-10-CM | POA: Diagnosis not present

## 2012-08-02 DIAGNOSIS — N189 Chronic kidney disease, unspecified: Secondary | ICD-10-CM | POA: Diagnosis not present

## 2012-08-04 DIAGNOSIS — E118 Type 2 diabetes mellitus with unspecified complications: Secondary | ICD-10-CM | POA: Diagnosis not present

## 2012-08-04 DIAGNOSIS — Z79899 Other long term (current) drug therapy: Secondary | ICD-10-CM | POA: Diagnosis not present

## 2012-08-04 DIAGNOSIS — B182 Chronic viral hepatitis C: Secondary | ICD-10-CM | POA: Diagnosis not present

## 2012-08-04 DIAGNOSIS — B20 Human immunodeficiency virus [HIV] disease: Secondary | ICD-10-CM | POA: Diagnosis not present

## 2012-08-06 ENCOUNTER — Ambulatory Visit: Payer: Self-pay

## 2012-08-06 DIAGNOSIS — E119 Type 2 diabetes mellitus without complications: Secondary | ICD-10-CM | POA: Diagnosis not present

## 2012-08-06 DIAGNOSIS — B20 Human immunodeficiency virus [HIV] disease: Secondary | ICD-10-CM | POA: Diagnosis not present

## 2012-08-06 LAB — CBC WITH DIFFERENTIAL/PLATELET
Comment - H1-Com3: NORMAL
HCT: 34.8 % — ABNORMAL LOW (ref 40.0–52.0)
HGB: 12.5 g/dL — ABNORMAL LOW (ref 13.0–18.0)
MCH: 40.2 pg — ABNORMAL HIGH (ref 26.0–34.0)
MCHC: 36 g/dL (ref 32.0–36.0)
MCV: 112 fL — ABNORMAL HIGH (ref 80–100)
Monocytes: 5 %
Platelet: 149 10*3/uL — ABNORMAL LOW (ref 150–440)
RBC: 3.12 10*6/uL — ABNORMAL LOW (ref 4.40–5.90)
RDW: 13.1 % (ref 11.5–14.5)
Variant Lymphocyte - H1-Rlymph: 1 %

## 2012-08-06 LAB — COMPREHENSIVE METABOLIC PANEL
Anion Gap: 8 (ref 7–16)
BUN: 41 mg/dL — ABNORMAL HIGH (ref 7–18)
Bilirubin,Total: 1.5 mg/dL — ABNORMAL HIGH (ref 0.2–1.0)
Calcium, Total: 8.3 mg/dL — ABNORMAL LOW (ref 8.5–10.1)
Chloride: 103 mmol/L (ref 98–107)
Co2: 26 mmol/L (ref 21–32)
Creatinine: 1.58 mg/dL — ABNORMAL HIGH (ref 0.60–1.30)
EGFR (Non-African Amer.): 44 — ABNORMAL LOW
Osmolality: 295 (ref 275–301)
Potassium: 5.1 mmol/L (ref 3.5–5.1)
SGPT (ALT): 24 U/L (ref 12–78)
Sodium: 137 mmol/L (ref 136–145)

## 2012-08-10 DIAGNOSIS — E118 Type 2 diabetes mellitus with unspecified complications: Secondary | ICD-10-CM | POA: Diagnosis not present

## 2012-08-10 DIAGNOSIS — E785 Hyperlipidemia, unspecified: Secondary | ICD-10-CM | POA: Diagnosis not present

## 2012-08-10 DIAGNOSIS — B20 Human immunodeficiency virus [HIV] disease: Secondary | ICD-10-CM | POA: Diagnosis not present

## 2012-08-14 ENCOUNTER — Emergency Department: Payer: Self-pay | Admitting: Emergency Medicine

## 2012-08-14 DIAGNOSIS — Z79899 Other long term (current) drug therapy: Secondary | ICD-10-CM | POA: Diagnosis not present

## 2012-08-14 DIAGNOSIS — I2510 Atherosclerotic heart disease of native coronary artery without angina pectoris: Secondary | ICD-10-CM | POA: Diagnosis not present

## 2012-08-14 DIAGNOSIS — R002 Palpitations: Secondary | ICD-10-CM | POA: Diagnosis not present

## 2012-08-14 DIAGNOSIS — I4891 Unspecified atrial fibrillation: Secondary | ICD-10-CM | POA: Diagnosis not present

## 2012-08-14 DIAGNOSIS — I509 Heart failure, unspecified: Secondary | ICD-10-CM | POA: Diagnosis not present

## 2012-08-14 DIAGNOSIS — Z87891 Personal history of nicotine dependence: Secondary | ICD-10-CM | POA: Diagnosis not present

## 2012-08-14 DIAGNOSIS — E119 Type 2 diabetes mellitus without complications: Secondary | ICD-10-CM | POA: Diagnosis not present

## 2012-08-14 DIAGNOSIS — Z21 Asymptomatic human immunodeficiency virus [HIV] infection status: Secondary | ICD-10-CM | POA: Diagnosis not present

## 2012-08-14 LAB — COMPREHENSIVE METABOLIC PANEL
Albumin: 3.1 g/dL — ABNORMAL LOW (ref 3.4–5.0)
Alkaline Phosphatase: 115 U/L (ref 50–136)
Bilirubin,Total: 2.1 mg/dL — ABNORMAL HIGH (ref 0.2–1.0)
Calcium, Total: 8.4 mg/dL — ABNORMAL LOW (ref 8.5–10.1)
Chloride: 104 mmol/L (ref 98–107)
Co2: 24 mmol/L (ref 21–32)
EGFR (African American): 47 — ABNORMAL LOW
Glucose: 347 mg/dL — ABNORMAL HIGH (ref 65–99)
Osmolality: 293 (ref 275–301)
SGOT(AST): 24 U/L (ref 15–37)
SGPT (ALT): 29 U/L (ref 12–78)
Sodium: 136 mmol/L (ref 136–145)

## 2012-08-14 LAB — CK TOTAL AND CKMB (NOT AT ARMC): CK, Total: 55 U/L (ref 35–232)

## 2012-08-14 LAB — CBC
HCT: 36.7 % — ABNORMAL LOW (ref 40.0–52.0)
HGB: 12.8 g/dL — ABNORMAL LOW (ref 13.0–18.0)
MCH: 39.4 pg — ABNORMAL HIGH (ref 26.0–34.0)
Platelet: 91 10*3/uL — ABNORMAL LOW (ref 150–440)
RBC: 3.26 10*6/uL — ABNORMAL LOW (ref 4.40–5.90)
WBC: 7.4 10*3/uL (ref 3.8–10.6)

## 2012-08-14 LAB — MAGNESIUM: Magnesium: 2.2 mg/dL

## 2012-08-14 LAB — TROPONIN I: Troponin-I: 0.02 ng/mL

## 2012-08-16 DIAGNOSIS — I519 Heart disease, unspecified: Secondary | ICD-10-CM | POA: Diagnosis not present

## 2012-08-16 DIAGNOSIS — I4949 Other premature depolarization: Secondary | ICD-10-CM | POA: Diagnosis not present

## 2012-08-16 DIAGNOSIS — I2510 Atherosclerotic heart disease of native coronary artery without angina pectoris: Secondary | ICD-10-CM | POA: Diagnosis not present

## 2012-08-16 DIAGNOSIS — I4891 Unspecified atrial fibrillation: Secondary | ICD-10-CM | POA: Diagnosis not present

## 2012-08-18 DIAGNOSIS — I4891 Unspecified atrial fibrillation: Secondary | ICD-10-CM | POA: Diagnosis not present

## 2012-08-20 ENCOUNTER — Encounter: Payer: Self-pay | Admitting: Cardiothoracic Surgery

## 2012-08-20 ENCOUNTER — Encounter: Payer: Self-pay | Admitting: Nurse Practitioner

## 2012-08-20 DIAGNOSIS — A4902 Methicillin resistant Staphylococcus aureus infection, unspecified site: Secondary | ICD-10-CM | POA: Diagnosis not present

## 2012-08-20 DIAGNOSIS — N189 Chronic kidney disease, unspecified: Secondary | ICD-10-CM | POA: Diagnosis not present

## 2012-08-20 DIAGNOSIS — I83009 Varicose veins of unspecified lower extremity with ulcer of unspecified site: Secondary | ICD-10-CM | POA: Diagnosis not present

## 2012-08-20 DIAGNOSIS — L97209 Non-pressure chronic ulcer of unspecified calf with unspecified severity: Secondary | ICD-10-CM | POA: Diagnosis not present

## 2012-08-20 DIAGNOSIS — E119 Type 2 diabetes mellitus without complications: Secondary | ICD-10-CM | POA: Diagnosis not present

## 2012-08-20 DIAGNOSIS — I87319 Chronic venous hypertension (idiopathic) with ulcer of unspecified lower extremity: Secondary | ICD-10-CM | POA: Diagnosis not present

## 2012-09-02 DIAGNOSIS — I4891 Unspecified atrial fibrillation: Secondary | ICD-10-CM | POA: Diagnosis not present

## 2012-09-02 DIAGNOSIS — I2510 Atherosclerotic heart disease of native coronary artery without angina pectoris: Secondary | ICD-10-CM | POA: Diagnosis not present

## 2012-09-02 DIAGNOSIS — I519 Heart disease, unspecified: Secondary | ICD-10-CM | POA: Diagnosis not present

## 2012-09-02 DIAGNOSIS — I4949 Other premature depolarization: Secondary | ICD-10-CM | POA: Diagnosis not present

## 2012-09-03 DIAGNOSIS — L97909 Non-pressure chronic ulcer of unspecified part of unspecified lower leg with unspecified severity: Secondary | ICD-10-CM | POA: Diagnosis not present

## 2012-09-03 DIAGNOSIS — N189 Chronic kidney disease, unspecified: Secondary | ICD-10-CM | POA: Diagnosis not present

## 2012-09-03 DIAGNOSIS — E119 Type 2 diabetes mellitus without complications: Secondary | ICD-10-CM | POA: Diagnosis not present

## 2012-09-03 DIAGNOSIS — A4902 Methicillin resistant Staphylococcus aureus infection, unspecified site: Secondary | ICD-10-CM | POA: Diagnosis not present

## 2012-09-03 DIAGNOSIS — I87319 Chronic venous hypertension (idiopathic) with ulcer of unspecified lower extremity: Secondary | ICD-10-CM | POA: Diagnosis not present

## 2012-09-13 DIAGNOSIS — I83009 Varicose veins of unspecified lower extremity with ulcer of unspecified site: Secondary | ICD-10-CM | POA: Diagnosis not present

## 2012-09-13 DIAGNOSIS — N189 Chronic kidney disease, unspecified: Secondary | ICD-10-CM | POA: Diagnosis not present

## 2012-09-13 DIAGNOSIS — A4902 Methicillin resistant Staphylococcus aureus infection, unspecified site: Secondary | ICD-10-CM | POA: Diagnosis not present

## 2012-09-13 DIAGNOSIS — L97209 Non-pressure chronic ulcer of unspecified calf with unspecified severity: Secondary | ICD-10-CM | POA: Diagnosis not present

## 2012-09-13 DIAGNOSIS — E119 Type 2 diabetes mellitus without complications: Secondary | ICD-10-CM | POA: Diagnosis not present

## 2012-09-13 DIAGNOSIS — L97909 Non-pressure chronic ulcer of unspecified part of unspecified lower leg with unspecified severity: Secondary | ICD-10-CM | POA: Diagnosis not present

## 2012-09-13 DIAGNOSIS — I87319 Chronic venous hypertension (idiopathic) with ulcer of unspecified lower extremity: Secondary | ICD-10-CM | POA: Diagnosis not present

## 2012-09-19 ENCOUNTER — Encounter: Payer: Self-pay | Admitting: Cardiothoracic Surgery

## 2012-09-19 ENCOUNTER — Encounter: Payer: Self-pay | Admitting: Nurse Practitioner

## 2012-09-19 DIAGNOSIS — A4902 Methicillin resistant Staphylococcus aureus infection, unspecified site: Secondary | ICD-10-CM | POA: Diagnosis not present

## 2012-09-19 DIAGNOSIS — I87319 Chronic venous hypertension (idiopathic) with ulcer of unspecified lower extremity: Secondary | ICD-10-CM | POA: Diagnosis not present

## 2012-09-19 DIAGNOSIS — E119 Type 2 diabetes mellitus without complications: Secondary | ICD-10-CM | POA: Diagnosis not present

## 2012-09-19 DIAGNOSIS — N189 Chronic kidney disease, unspecified: Secondary | ICD-10-CM | POA: Diagnosis not present

## 2012-09-21 DIAGNOSIS — E119 Type 2 diabetes mellitus without complications: Secondary | ICD-10-CM | POA: Diagnosis not present

## 2012-09-21 DIAGNOSIS — N189 Chronic kidney disease, unspecified: Secondary | ICD-10-CM | POA: Diagnosis not present

## 2012-09-21 DIAGNOSIS — L02828 Furuncle of other sites: Secondary | ICD-10-CM | POA: Diagnosis not present

## 2012-09-21 DIAGNOSIS — I87319 Chronic venous hypertension (idiopathic) with ulcer of unspecified lower extremity: Secondary | ICD-10-CM | POA: Diagnosis not present

## 2012-09-21 DIAGNOSIS — A4902 Methicillin resistant Staphylococcus aureus infection, unspecified site: Secondary | ICD-10-CM | POA: Diagnosis not present

## 2012-09-29 DIAGNOSIS — S91009A Unspecified open wound, unspecified ankle, initial encounter: Secondary | ICD-10-CM | POA: Diagnosis not present

## 2012-10-01 DIAGNOSIS — I4891 Unspecified atrial fibrillation: Secondary | ICD-10-CM | POA: Diagnosis not present

## 2012-10-01 DIAGNOSIS — I4949 Other premature depolarization: Secondary | ICD-10-CM | POA: Diagnosis not present

## 2012-10-01 DIAGNOSIS — I252 Old myocardial infarction: Secondary | ICD-10-CM | POA: Diagnosis not present

## 2012-10-01 DIAGNOSIS — I5022 Chronic systolic (congestive) heart failure: Secondary | ICD-10-CM | POA: Diagnosis not present

## 2012-10-15 DIAGNOSIS — D691 Qualitative platelet defects: Secondary | ICD-10-CM | POA: Diagnosis not present

## 2012-10-20 ENCOUNTER — Encounter: Payer: Self-pay | Admitting: Nurse Practitioner

## 2012-10-20 ENCOUNTER — Encounter: Payer: Self-pay | Admitting: Cardiothoracic Surgery

## 2012-10-20 DIAGNOSIS — E119 Type 2 diabetes mellitus without complications: Secondary | ICD-10-CM | POA: Diagnosis not present

## 2012-10-20 DIAGNOSIS — L02828 Furuncle of other sites: Secondary | ICD-10-CM | POA: Diagnosis not present

## 2012-10-20 DIAGNOSIS — T8189XA Other complications of procedures, not elsewhere classified, initial encounter: Secondary | ICD-10-CM | POA: Diagnosis not present

## 2012-10-20 DIAGNOSIS — N189 Chronic kidney disease, unspecified: Secondary | ICD-10-CM | POA: Diagnosis not present

## 2012-10-20 DIAGNOSIS — I87319 Chronic venous hypertension (idiopathic) with ulcer of unspecified lower extremity: Secondary | ICD-10-CM | POA: Diagnosis not present

## 2012-10-20 DIAGNOSIS — L97209 Non-pressure chronic ulcer of unspecified calf with unspecified severity: Secondary | ICD-10-CM | POA: Diagnosis not present

## 2012-10-20 DIAGNOSIS — A4902 Methicillin resistant Staphylococcus aureus infection, unspecified site: Secondary | ICD-10-CM | POA: Diagnosis not present

## 2012-10-26 DIAGNOSIS — Z8673 Personal history of transient ischemic attack (TIA), and cerebral infarction without residual deficits: Secondary | ICD-10-CM | POA: Diagnosis not present

## 2012-10-26 DIAGNOSIS — Z7902 Long term (current) use of antithrombotics/antiplatelets: Secondary | ICD-10-CM | POA: Diagnosis not present

## 2012-10-26 DIAGNOSIS — Z886 Allergy status to analgesic agent status: Secondary | ICD-10-CM | POA: Diagnosis not present

## 2012-10-26 DIAGNOSIS — M948X9 Other specified disorders of cartilage, unspecified sites: Secondary | ICD-10-CM | POA: Diagnosis not present

## 2012-10-26 DIAGNOSIS — I2510 Atherosclerotic heart disease of native coronary artery without angina pectoris: Secondary | ICD-10-CM | POA: Diagnosis not present

## 2012-10-26 DIAGNOSIS — I10 Essential (primary) hypertension: Secondary | ICD-10-CM | POA: Diagnosis not present

## 2012-10-26 DIAGNOSIS — Z7982 Long term (current) use of aspirin: Secondary | ICD-10-CM | POA: Diagnosis not present

## 2012-10-26 DIAGNOSIS — I252 Old myocardial infarction: Secondary | ICD-10-CM | POA: Diagnosis not present

## 2012-10-26 DIAGNOSIS — G8918 Other acute postprocedural pain: Secondary | ICD-10-CM | POA: Diagnosis not present

## 2012-10-26 DIAGNOSIS — Z9861 Coronary angioplasty status: Secondary | ICD-10-CM | POA: Diagnosis not present

## 2012-10-26 DIAGNOSIS — I2589 Other forms of chronic ischemic heart disease: Secondary | ICD-10-CM | POA: Diagnosis not present

## 2012-10-26 DIAGNOSIS — E119 Type 2 diabetes mellitus without complications: Secondary | ICD-10-CM | POA: Diagnosis not present

## 2012-10-27 DIAGNOSIS — T8189XA Other complications of procedures, not elsewhere classified, initial encounter: Secondary | ICD-10-CM | POA: Diagnosis not present

## 2012-10-27 DIAGNOSIS — N189 Chronic kidney disease, unspecified: Secondary | ICD-10-CM | POA: Diagnosis not present

## 2012-10-27 DIAGNOSIS — I87319 Chronic venous hypertension (idiopathic) with ulcer of unspecified lower extremity: Secondary | ICD-10-CM | POA: Diagnosis not present

## 2012-10-27 DIAGNOSIS — L97909 Non-pressure chronic ulcer of unspecified part of unspecified lower leg with unspecified severity: Secondary | ICD-10-CM | POA: Diagnosis not present

## 2012-10-27 DIAGNOSIS — E119 Type 2 diabetes mellitus without complications: Secondary | ICD-10-CM | POA: Diagnosis not present

## 2012-10-27 DIAGNOSIS — L97209 Non-pressure chronic ulcer of unspecified calf with unspecified severity: Secondary | ICD-10-CM | POA: Diagnosis not present

## 2012-10-27 DIAGNOSIS — L02838 Carbuncle of other sites: Secondary | ICD-10-CM | POA: Diagnosis not present

## 2012-10-27 DIAGNOSIS — L02828 Furuncle of other sites: Secondary | ICD-10-CM | POA: Diagnosis not present

## 2012-10-27 DIAGNOSIS — A4902 Methicillin resistant Staphylococcus aureus infection, unspecified site: Secondary | ICD-10-CM | POA: Diagnosis not present

## 2012-10-29 DIAGNOSIS — L02838 Carbuncle of other sites: Secondary | ICD-10-CM | POA: Diagnosis not present

## 2012-10-29 DIAGNOSIS — E119 Type 2 diabetes mellitus without complications: Secondary | ICD-10-CM | POA: Diagnosis not present

## 2012-10-29 DIAGNOSIS — L97909 Non-pressure chronic ulcer of unspecified part of unspecified lower leg with unspecified severity: Secondary | ICD-10-CM | POA: Diagnosis not present

## 2012-10-29 DIAGNOSIS — L97209 Non-pressure chronic ulcer of unspecified calf with unspecified severity: Secondary | ICD-10-CM | POA: Diagnosis not present

## 2012-10-29 DIAGNOSIS — A4902 Methicillin resistant Staphylococcus aureus infection, unspecified site: Secondary | ICD-10-CM | POA: Diagnosis not present

## 2012-10-29 DIAGNOSIS — N189 Chronic kidney disease, unspecified: Secondary | ICD-10-CM | POA: Diagnosis not present

## 2012-10-29 DIAGNOSIS — T8189XA Other complications of procedures, not elsewhere classified, initial encounter: Secondary | ICD-10-CM | POA: Diagnosis not present

## 2012-11-02 DIAGNOSIS — L97909 Non-pressure chronic ulcer of unspecified part of unspecified lower leg with unspecified severity: Secondary | ICD-10-CM | POA: Diagnosis not present

## 2012-11-02 DIAGNOSIS — A4902 Methicillin resistant Staphylococcus aureus infection, unspecified site: Secondary | ICD-10-CM | POA: Diagnosis not present

## 2012-11-02 DIAGNOSIS — N189 Chronic kidney disease, unspecified: Secondary | ICD-10-CM | POA: Diagnosis not present

## 2012-11-02 DIAGNOSIS — E119 Type 2 diabetes mellitus without complications: Secondary | ICD-10-CM | POA: Diagnosis not present

## 2012-11-02 DIAGNOSIS — I87319 Chronic venous hypertension (idiopathic) with ulcer of unspecified lower extremity: Secondary | ICD-10-CM | POA: Diagnosis not present

## 2012-11-05 DIAGNOSIS — T8189XA Other complications of procedures, not elsewhere classified, initial encounter: Secondary | ICD-10-CM | POA: Diagnosis not present

## 2012-11-05 DIAGNOSIS — E119 Type 2 diabetes mellitus without complications: Secondary | ICD-10-CM | POA: Diagnosis not present

## 2012-11-05 DIAGNOSIS — L97909 Non-pressure chronic ulcer of unspecified part of unspecified lower leg with unspecified severity: Secondary | ICD-10-CM | POA: Diagnosis not present

## 2012-11-05 DIAGNOSIS — A4902 Methicillin resistant Staphylococcus aureus infection, unspecified site: Secondary | ICD-10-CM | POA: Diagnosis not present

## 2012-11-05 DIAGNOSIS — L97209 Non-pressure chronic ulcer of unspecified calf with unspecified severity: Secondary | ICD-10-CM | POA: Diagnosis not present

## 2012-11-05 DIAGNOSIS — L02838 Carbuncle of other sites: Secondary | ICD-10-CM | POA: Diagnosis not present

## 2012-11-05 DIAGNOSIS — N189 Chronic kidney disease, unspecified: Secondary | ICD-10-CM | POA: Diagnosis not present

## 2012-11-08 DIAGNOSIS — A4902 Methicillin resistant Staphylococcus aureus infection, unspecified site: Secondary | ICD-10-CM | POA: Diagnosis not present

## 2012-11-08 DIAGNOSIS — T8189XA Other complications of procedures, not elsewhere classified, initial encounter: Secondary | ICD-10-CM | POA: Diagnosis not present

## 2012-11-08 DIAGNOSIS — N189 Chronic kidney disease, unspecified: Secondary | ICD-10-CM | POA: Diagnosis not present

## 2012-11-08 DIAGNOSIS — L02838 Carbuncle of other sites: Secondary | ICD-10-CM | POA: Diagnosis not present

## 2012-11-08 DIAGNOSIS — L97209 Non-pressure chronic ulcer of unspecified calf with unspecified severity: Secondary | ICD-10-CM | POA: Diagnosis not present

## 2012-11-08 DIAGNOSIS — E119 Type 2 diabetes mellitus without complications: Secondary | ICD-10-CM | POA: Diagnosis not present

## 2012-11-08 DIAGNOSIS — I87319 Chronic venous hypertension (idiopathic) with ulcer of unspecified lower extremity: Secondary | ICD-10-CM | POA: Diagnosis not present

## 2012-11-08 DIAGNOSIS — L02828 Furuncle of other sites: Secondary | ICD-10-CM | POA: Diagnosis not present

## 2012-11-10 DIAGNOSIS — E119 Type 2 diabetes mellitus without complications: Secondary | ICD-10-CM | POA: Diagnosis not present

## 2012-11-10 DIAGNOSIS — L02838 Carbuncle of other sites: Secondary | ICD-10-CM | POA: Diagnosis not present

## 2012-11-10 DIAGNOSIS — T8189XA Other complications of procedures, not elsewhere classified, initial encounter: Secondary | ICD-10-CM | POA: Diagnosis not present

## 2012-11-10 DIAGNOSIS — L02828 Furuncle of other sites: Secondary | ICD-10-CM | POA: Diagnosis not present

## 2012-11-10 DIAGNOSIS — I87319 Chronic venous hypertension (idiopathic) with ulcer of unspecified lower extremity: Secondary | ICD-10-CM | POA: Diagnosis not present

## 2012-11-10 DIAGNOSIS — L97909 Non-pressure chronic ulcer of unspecified part of unspecified lower leg with unspecified severity: Secondary | ICD-10-CM | POA: Diagnosis not present

## 2012-11-10 DIAGNOSIS — L97209 Non-pressure chronic ulcer of unspecified calf with unspecified severity: Secondary | ICD-10-CM | POA: Diagnosis not present

## 2012-11-10 DIAGNOSIS — N189 Chronic kidney disease, unspecified: Secondary | ICD-10-CM | POA: Diagnosis not present

## 2012-11-10 DIAGNOSIS — A4902 Methicillin resistant Staphylococcus aureus infection, unspecified site: Secondary | ICD-10-CM | POA: Diagnosis not present

## 2012-11-12 DIAGNOSIS — A4902 Methicillin resistant Staphylococcus aureus infection, unspecified site: Secondary | ICD-10-CM | POA: Diagnosis not present

## 2012-11-12 DIAGNOSIS — N189 Chronic kidney disease, unspecified: Secondary | ICD-10-CM | POA: Diagnosis not present

## 2012-11-12 DIAGNOSIS — L97209 Non-pressure chronic ulcer of unspecified calf with unspecified severity: Secondary | ICD-10-CM | POA: Diagnosis not present

## 2012-11-12 DIAGNOSIS — L02828 Furuncle of other sites: Secondary | ICD-10-CM | POA: Diagnosis not present

## 2012-11-12 DIAGNOSIS — I87319 Chronic venous hypertension (idiopathic) with ulcer of unspecified lower extremity: Secondary | ICD-10-CM | POA: Diagnosis not present

## 2012-11-12 DIAGNOSIS — T8189XA Other complications of procedures, not elsewhere classified, initial encounter: Secondary | ICD-10-CM | POA: Diagnosis not present

## 2012-11-12 DIAGNOSIS — E119 Type 2 diabetes mellitus without complications: Secondary | ICD-10-CM | POA: Diagnosis not present

## 2012-11-15 DIAGNOSIS — L97909 Non-pressure chronic ulcer of unspecified part of unspecified lower leg with unspecified severity: Secondary | ICD-10-CM | POA: Diagnosis not present

## 2012-11-15 DIAGNOSIS — T8189XA Other complications of procedures, not elsewhere classified, initial encounter: Secondary | ICD-10-CM | POA: Diagnosis not present

## 2012-11-15 DIAGNOSIS — L97209 Non-pressure chronic ulcer of unspecified calf with unspecified severity: Secondary | ICD-10-CM | POA: Diagnosis not present

## 2012-11-15 DIAGNOSIS — N189 Chronic kidney disease, unspecified: Secondary | ICD-10-CM | POA: Diagnosis not present

## 2012-11-15 DIAGNOSIS — A4902 Methicillin resistant Staphylococcus aureus infection, unspecified site: Secondary | ICD-10-CM | POA: Diagnosis not present

## 2012-11-15 DIAGNOSIS — L02828 Furuncle of other sites: Secondary | ICD-10-CM | POA: Diagnosis not present

## 2012-11-15 DIAGNOSIS — I87319 Chronic venous hypertension (idiopathic) with ulcer of unspecified lower extremity: Secondary | ICD-10-CM | POA: Diagnosis not present

## 2012-11-15 DIAGNOSIS — L0211 Cutaneous abscess of neck: Secondary | ICD-10-CM | POA: Diagnosis not present

## 2012-11-15 DIAGNOSIS — E119 Type 2 diabetes mellitus without complications: Secondary | ICD-10-CM | POA: Diagnosis not present

## 2012-11-16 ENCOUNTER — Ambulatory Visit: Payer: Self-pay | Admitting: Surgery

## 2012-11-16 DIAGNOSIS — L03221 Cellulitis of neck: Secondary | ICD-10-CM | POA: Diagnosis not present

## 2012-11-16 DIAGNOSIS — Z79899 Other long term (current) drug therapy: Secondary | ICD-10-CM | POA: Diagnosis not present

## 2012-11-16 DIAGNOSIS — Z7982 Long term (current) use of aspirin: Secondary | ICD-10-CM | POA: Diagnosis not present

## 2012-11-16 DIAGNOSIS — E11319 Type 2 diabetes mellitus with unspecified diabetic retinopathy without macular edema: Secondary | ICD-10-CM | POA: Diagnosis not present

## 2012-11-16 DIAGNOSIS — L0211 Cutaneous abscess of neck: Secondary | ICD-10-CM | POA: Diagnosis not present

## 2012-11-16 DIAGNOSIS — A4902 Methicillin resistant Staphylococcus aureus infection, unspecified site: Secondary | ICD-10-CM | POA: Diagnosis not present

## 2012-11-16 DIAGNOSIS — H409 Unspecified glaucoma: Secondary | ICD-10-CM | POA: Diagnosis not present

## 2012-11-16 DIAGNOSIS — E1139 Type 2 diabetes mellitus with other diabetic ophthalmic complication: Secondary | ICD-10-CM | POA: Diagnosis not present

## 2012-11-16 DIAGNOSIS — Z885 Allergy status to narcotic agent status: Secondary | ICD-10-CM | POA: Diagnosis not present

## 2012-11-16 DIAGNOSIS — I428 Other cardiomyopathies: Secondary | ICD-10-CM | POA: Diagnosis not present

## 2012-11-16 LAB — WOUND CULTURE

## 2012-11-19 DIAGNOSIS — L97209 Non-pressure chronic ulcer of unspecified calf with unspecified severity: Secondary | ICD-10-CM | POA: Diagnosis not present

## 2012-11-19 DIAGNOSIS — L02828 Furuncle of other sites: Secondary | ICD-10-CM | POA: Diagnosis not present

## 2012-11-19 DIAGNOSIS — N189 Chronic kidney disease, unspecified: Secondary | ICD-10-CM | POA: Diagnosis not present

## 2012-11-19 DIAGNOSIS — T8189XA Other complications of procedures, not elsewhere classified, initial encounter: Secondary | ICD-10-CM | POA: Diagnosis not present

## 2012-11-19 DIAGNOSIS — E119 Type 2 diabetes mellitus without complications: Secondary | ICD-10-CM | POA: Diagnosis not present

## 2012-11-19 DIAGNOSIS — A4902 Methicillin resistant Staphylococcus aureus infection, unspecified site: Secondary | ICD-10-CM | POA: Diagnosis not present

## 2012-11-19 DIAGNOSIS — L97909 Non-pressure chronic ulcer of unspecified part of unspecified lower leg with unspecified severity: Secondary | ICD-10-CM | POA: Diagnosis not present

## 2012-11-20 ENCOUNTER — Encounter: Payer: Self-pay | Admitting: Nurse Practitioner

## 2012-11-20 ENCOUNTER — Encounter: Payer: Self-pay | Admitting: Cardiothoracic Surgery

## 2012-11-20 DIAGNOSIS — E119 Type 2 diabetes mellitus without complications: Secondary | ICD-10-CM | POA: Diagnosis not present

## 2012-11-20 DIAGNOSIS — A4902 Methicillin resistant Staphylococcus aureus infection, unspecified site: Secondary | ICD-10-CM | POA: Diagnosis not present

## 2012-11-20 DIAGNOSIS — L97509 Non-pressure chronic ulcer of other part of unspecified foot with unspecified severity: Secondary | ICD-10-CM | POA: Diagnosis not present

## 2012-11-20 DIAGNOSIS — N189 Chronic kidney disease, unspecified: Secondary | ICD-10-CM | POA: Diagnosis not present

## 2012-11-20 DIAGNOSIS — I87319 Chronic venous hypertension (idiopathic) with ulcer of unspecified lower extremity: Secondary | ICD-10-CM | POA: Diagnosis not present

## 2012-11-20 DIAGNOSIS — L0211 Cutaneous abscess of neck: Secondary | ICD-10-CM | POA: Diagnosis not present

## 2012-11-22 DIAGNOSIS — L03221 Cellulitis of neck: Secondary | ICD-10-CM | POA: Diagnosis not present

## 2012-11-22 DIAGNOSIS — L97509 Non-pressure chronic ulcer of other part of unspecified foot with unspecified severity: Secondary | ICD-10-CM | POA: Diagnosis not present

## 2012-11-22 DIAGNOSIS — N189 Chronic kidney disease, unspecified: Secondary | ICD-10-CM | POA: Diagnosis not present

## 2012-11-22 DIAGNOSIS — L97909 Non-pressure chronic ulcer of unspecified part of unspecified lower leg with unspecified severity: Secondary | ICD-10-CM | POA: Diagnosis not present

## 2012-11-22 DIAGNOSIS — E119 Type 2 diabetes mellitus without complications: Secondary | ICD-10-CM | POA: Diagnosis not present

## 2012-11-22 DIAGNOSIS — I87319 Chronic venous hypertension (idiopathic) with ulcer of unspecified lower extremity: Secondary | ICD-10-CM | POA: Diagnosis not present

## 2012-11-22 DIAGNOSIS — A4902 Methicillin resistant Staphylococcus aureus infection, unspecified site: Secondary | ICD-10-CM | POA: Diagnosis not present

## 2012-11-22 DIAGNOSIS — L0211 Cutaneous abscess of neck: Secondary | ICD-10-CM | POA: Diagnosis not present

## 2012-11-24 DIAGNOSIS — A4902 Methicillin resistant Staphylococcus aureus infection, unspecified site: Secondary | ICD-10-CM | POA: Diagnosis not present

## 2012-11-24 DIAGNOSIS — E119 Type 2 diabetes mellitus without complications: Secondary | ICD-10-CM | POA: Diagnosis not present

## 2012-11-24 DIAGNOSIS — L0211 Cutaneous abscess of neck: Secondary | ICD-10-CM | POA: Diagnosis not present

## 2012-11-24 DIAGNOSIS — L97909 Non-pressure chronic ulcer of unspecified part of unspecified lower leg with unspecified severity: Secondary | ICD-10-CM | POA: Diagnosis not present

## 2012-11-24 DIAGNOSIS — N189 Chronic kidney disease, unspecified: Secondary | ICD-10-CM | POA: Diagnosis not present

## 2012-11-24 DIAGNOSIS — L97509 Non-pressure chronic ulcer of other part of unspecified foot with unspecified severity: Secondary | ICD-10-CM | POA: Diagnosis not present

## 2012-11-29 DIAGNOSIS — I87319 Chronic venous hypertension (idiopathic) with ulcer of unspecified lower extremity: Secondary | ICD-10-CM | POA: Diagnosis not present

## 2012-11-29 DIAGNOSIS — E119 Type 2 diabetes mellitus without complications: Secondary | ICD-10-CM | POA: Diagnosis not present

## 2012-11-29 DIAGNOSIS — N189 Chronic kidney disease, unspecified: Secondary | ICD-10-CM | POA: Diagnosis not present

## 2012-11-29 DIAGNOSIS — A4902 Methicillin resistant Staphylococcus aureus infection, unspecified site: Secondary | ICD-10-CM | POA: Diagnosis not present

## 2012-11-29 DIAGNOSIS — L97509 Non-pressure chronic ulcer of other part of unspecified foot with unspecified severity: Secondary | ICD-10-CM | POA: Diagnosis not present

## 2012-11-29 DIAGNOSIS — L0211 Cutaneous abscess of neck: Secondary | ICD-10-CM | POA: Diagnosis not present

## 2012-12-05 ENCOUNTER — Emergency Department: Payer: Self-pay | Admitting: Surgery

## 2012-12-06 DIAGNOSIS — E119 Type 2 diabetes mellitus without complications: Secondary | ICD-10-CM | POA: Diagnosis not present

## 2012-12-06 DIAGNOSIS — A4902 Methicillin resistant Staphylococcus aureus infection, unspecified site: Secondary | ICD-10-CM | POA: Diagnosis not present

## 2012-12-06 DIAGNOSIS — L97509 Non-pressure chronic ulcer of other part of unspecified foot with unspecified severity: Secondary | ICD-10-CM | POA: Diagnosis not present

## 2012-12-06 DIAGNOSIS — I87319 Chronic venous hypertension (idiopathic) with ulcer of unspecified lower extremity: Secondary | ICD-10-CM | POA: Diagnosis not present

## 2012-12-06 DIAGNOSIS — L03221 Cellulitis of neck: Secondary | ICD-10-CM | POA: Diagnosis not present

## 2012-12-06 DIAGNOSIS — N189 Chronic kidney disease, unspecified: Secondary | ICD-10-CM | POA: Diagnosis not present

## 2012-12-07 DIAGNOSIS — L0211 Cutaneous abscess of neck: Secondary | ICD-10-CM | POA: Diagnosis not present

## 2012-12-08 DIAGNOSIS — A4902 Methicillin resistant Staphylococcus aureus infection, unspecified site: Secondary | ICD-10-CM | POA: Diagnosis not present

## 2012-12-08 DIAGNOSIS — N189 Chronic kidney disease, unspecified: Secondary | ICD-10-CM | POA: Diagnosis not present

## 2012-12-08 DIAGNOSIS — I87319 Chronic venous hypertension (idiopathic) with ulcer of unspecified lower extremity: Secondary | ICD-10-CM | POA: Diagnosis not present

## 2012-12-08 DIAGNOSIS — L97509 Non-pressure chronic ulcer of other part of unspecified foot with unspecified severity: Secondary | ICD-10-CM | POA: Diagnosis not present

## 2012-12-08 DIAGNOSIS — E119 Type 2 diabetes mellitus without complications: Secondary | ICD-10-CM | POA: Diagnosis not present

## 2012-12-08 DIAGNOSIS — L0211 Cutaneous abscess of neck: Secondary | ICD-10-CM | POA: Diagnosis not present

## 2012-12-15 DIAGNOSIS — L0201 Cutaneous abscess of face: Secondary | ICD-10-CM | POA: Diagnosis not present

## 2012-12-15 DIAGNOSIS — L03211 Cellulitis of face: Secondary | ICD-10-CM | POA: Diagnosis not present

## 2012-12-18 ENCOUNTER — Encounter: Payer: Self-pay | Admitting: Nurse Practitioner

## 2012-12-18 ENCOUNTER — Encounter: Payer: Self-pay | Admitting: Cardiothoracic Surgery

## 2012-12-18 DIAGNOSIS — N189 Chronic kidney disease, unspecified: Secondary | ICD-10-CM | POA: Diagnosis not present

## 2012-12-18 DIAGNOSIS — I87319 Chronic venous hypertension (idiopathic) with ulcer of unspecified lower extremity: Secondary | ICD-10-CM | POA: Diagnosis not present

## 2012-12-18 DIAGNOSIS — E119 Type 2 diabetes mellitus without complications: Secondary | ICD-10-CM | POA: Diagnosis not present

## 2012-12-18 DIAGNOSIS — A4902 Methicillin resistant Staphylococcus aureus infection, unspecified site: Secondary | ICD-10-CM | POA: Diagnosis not present

## 2012-12-20 DIAGNOSIS — A4902 Methicillin resistant Staphylococcus aureus infection, unspecified site: Secondary | ICD-10-CM | POA: Diagnosis not present

## 2012-12-20 DIAGNOSIS — L97909 Non-pressure chronic ulcer of unspecified part of unspecified lower leg with unspecified severity: Secondary | ICD-10-CM | POA: Diagnosis not present

## 2012-12-20 DIAGNOSIS — N189 Chronic kidney disease, unspecified: Secondary | ICD-10-CM | POA: Diagnosis not present

## 2012-12-20 DIAGNOSIS — E119 Type 2 diabetes mellitus without complications: Secondary | ICD-10-CM | POA: Diagnosis not present

## 2012-12-22 DIAGNOSIS — N189 Chronic kidney disease, unspecified: Secondary | ICD-10-CM | POA: Diagnosis not present

## 2012-12-22 DIAGNOSIS — A4902 Methicillin resistant Staphylococcus aureus infection, unspecified site: Secondary | ICD-10-CM | POA: Diagnosis not present

## 2012-12-22 DIAGNOSIS — E119 Type 2 diabetes mellitus without complications: Secondary | ICD-10-CM | POA: Diagnosis not present

## 2012-12-22 DIAGNOSIS — I87319 Chronic venous hypertension (idiopathic) with ulcer of unspecified lower extremity: Secondary | ICD-10-CM | POA: Diagnosis not present

## 2012-12-25 DIAGNOSIS — N189 Chronic kidney disease, unspecified: Secondary | ICD-10-CM | POA: Diagnosis not present

## 2012-12-25 DIAGNOSIS — A4902 Methicillin resistant Staphylococcus aureus infection, unspecified site: Secondary | ICD-10-CM | POA: Diagnosis not present

## 2012-12-25 DIAGNOSIS — I87319 Chronic venous hypertension (idiopathic) with ulcer of unspecified lower extremity: Secondary | ICD-10-CM | POA: Diagnosis not present

## 2012-12-25 DIAGNOSIS — E119 Type 2 diabetes mellitus without complications: Secondary | ICD-10-CM | POA: Diagnosis not present

## 2012-12-27 DIAGNOSIS — N189 Chronic kidney disease, unspecified: Secondary | ICD-10-CM | POA: Diagnosis not present

## 2012-12-27 DIAGNOSIS — I87319 Chronic venous hypertension (idiopathic) with ulcer of unspecified lower extremity: Secondary | ICD-10-CM | POA: Diagnosis not present

## 2012-12-27 DIAGNOSIS — E119 Type 2 diabetes mellitus without complications: Secondary | ICD-10-CM | POA: Diagnosis not present

## 2012-12-27 DIAGNOSIS — A4902 Methicillin resistant Staphylococcus aureus infection, unspecified site: Secondary | ICD-10-CM | POA: Diagnosis not present

## 2012-12-29 DIAGNOSIS — N189 Chronic kidney disease, unspecified: Secondary | ICD-10-CM | POA: Diagnosis not present

## 2012-12-29 DIAGNOSIS — A4902 Methicillin resistant Staphylococcus aureus infection, unspecified site: Secondary | ICD-10-CM | POA: Diagnosis not present

## 2012-12-29 DIAGNOSIS — E119 Type 2 diabetes mellitus without complications: Secondary | ICD-10-CM | POA: Diagnosis not present

## 2012-12-29 DIAGNOSIS — I87319 Chronic venous hypertension (idiopathic) with ulcer of unspecified lower extremity: Secondary | ICD-10-CM | POA: Diagnosis not present

## 2012-12-31 DIAGNOSIS — A4902 Methicillin resistant Staphylococcus aureus infection, unspecified site: Secondary | ICD-10-CM | POA: Diagnosis not present

## 2012-12-31 DIAGNOSIS — N189 Chronic kidney disease, unspecified: Secondary | ICD-10-CM | POA: Diagnosis not present

## 2012-12-31 DIAGNOSIS — E119 Type 2 diabetes mellitus without complications: Secondary | ICD-10-CM | POA: Diagnosis not present

## 2012-12-31 DIAGNOSIS — L97909 Non-pressure chronic ulcer of unspecified part of unspecified lower leg with unspecified severity: Secondary | ICD-10-CM | POA: Diagnosis not present

## 2013-01-03 DIAGNOSIS — N189 Chronic kidney disease, unspecified: Secondary | ICD-10-CM | POA: Diagnosis not present

## 2013-01-03 DIAGNOSIS — E119 Type 2 diabetes mellitus without complications: Secondary | ICD-10-CM | POA: Diagnosis not present

## 2013-01-03 DIAGNOSIS — L97909 Non-pressure chronic ulcer of unspecified part of unspecified lower leg with unspecified severity: Secondary | ICD-10-CM | POA: Diagnosis not present

## 2013-01-03 DIAGNOSIS — A4902 Methicillin resistant Staphylococcus aureus infection, unspecified site: Secondary | ICD-10-CM | POA: Diagnosis not present

## 2013-01-06 DIAGNOSIS — E119 Type 2 diabetes mellitus without complications: Secondary | ICD-10-CM | POA: Diagnosis not present

## 2013-01-06 DIAGNOSIS — N189 Chronic kidney disease, unspecified: Secondary | ICD-10-CM | POA: Diagnosis not present

## 2013-01-06 DIAGNOSIS — A4902 Methicillin resistant Staphylococcus aureus infection, unspecified site: Secondary | ICD-10-CM | POA: Diagnosis not present

## 2013-01-06 DIAGNOSIS — I87319 Chronic venous hypertension (idiopathic) with ulcer of unspecified lower extremity: Secondary | ICD-10-CM | POA: Diagnosis not present

## 2013-01-13 DIAGNOSIS — I87319 Chronic venous hypertension (idiopathic) with ulcer of unspecified lower extremity: Secondary | ICD-10-CM | POA: Diagnosis not present

## 2013-01-13 DIAGNOSIS — N189 Chronic kidney disease, unspecified: Secondary | ICD-10-CM | POA: Diagnosis not present

## 2013-01-13 DIAGNOSIS — A4902 Methicillin resistant Staphylococcus aureus infection, unspecified site: Secondary | ICD-10-CM | POA: Diagnosis not present

## 2013-01-13 DIAGNOSIS — E119 Type 2 diabetes mellitus without complications: Secondary | ICD-10-CM | POA: Diagnosis not present

## 2013-01-13 DIAGNOSIS — L97909 Non-pressure chronic ulcer of unspecified part of unspecified lower leg with unspecified severity: Secondary | ICD-10-CM | POA: Diagnosis not present

## 2013-01-17 DIAGNOSIS — A4902 Methicillin resistant Staphylococcus aureus infection, unspecified site: Secondary | ICD-10-CM | POA: Diagnosis not present

## 2013-01-17 DIAGNOSIS — L97909 Non-pressure chronic ulcer of unspecified part of unspecified lower leg with unspecified severity: Secondary | ICD-10-CM | POA: Diagnosis not present

## 2013-01-17 DIAGNOSIS — N189 Chronic kidney disease, unspecified: Secondary | ICD-10-CM | POA: Diagnosis not present

## 2013-01-17 DIAGNOSIS — E119 Type 2 diabetes mellitus without complications: Secondary | ICD-10-CM | POA: Diagnosis not present

## 2013-01-18 ENCOUNTER — Encounter: Payer: Self-pay | Admitting: Nurse Practitioner

## 2013-01-18 ENCOUNTER — Encounter: Payer: Self-pay | Admitting: Cardiothoracic Surgery

## 2013-01-18 DIAGNOSIS — N189 Chronic kidney disease, unspecified: Secondary | ICD-10-CM | POA: Diagnosis not present

## 2013-01-18 DIAGNOSIS — L97909 Non-pressure chronic ulcer of unspecified part of unspecified lower leg with unspecified severity: Secondary | ICD-10-CM | POA: Diagnosis not present

## 2013-01-18 DIAGNOSIS — R609 Edema, unspecified: Secondary | ICD-10-CM | POA: Diagnosis not present

## 2013-01-18 DIAGNOSIS — I87319 Chronic venous hypertension (idiopathic) with ulcer of unspecified lower extremity: Secondary | ICD-10-CM | POA: Diagnosis not present

## 2013-01-18 DIAGNOSIS — E119 Type 2 diabetes mellitus without complications: Secondary | ICD-10-CM | POA: Diagnosis not present

## 2013-01-18 DIAGNOSIS — L97209 Non-pressure chronic ulcer of unspecified calf with unspecified severity: Secondary | ICD-10-CM | POA: Diagnosis not present

## 2013-01-18 DIAGNOSIS — I83219 Varicose veins of right lower extremity with both ulcer of unspecified site and inflammation: Secondary | ICD-10-CM | POA: Diagnosis not present

## 2013-01-20 DIAGNOSIS — L97209 Non-pressure chronic ulcer of unspecified calf with unspecified severity: Secondary | ICD-10-CM | POA: Diagnosis not present

## 2013-01-20 DIAGNOSIS — E119 Type 2 diabetes mellitus without complications: Secondary | ICD-10-CM | POA: Diagnosis not present

## 2013-01-20 DIAGNOSIS — I87319 Chronic venous hypertension (idiopathic) with ulcer of unspecified lower extremity: Secondary | ICD-10-CM | POA: Diagnosis not present

## 2013-01-20 DIAGNOSIS — L97929 Non-pressure chronic ulcer of unspecified part of left lower leg with unspecified severity: Secondary | ICD-10-CM | POA: Diagnosis not present

## 2013-01-20 DIAGNOSIS — N189 Chronic kidney disease, unspecified: Secondary | ICD-10-CM | POA: Diagnosis not present

## 2013-01-20 DIAGNOSIS — R609 Edema, unspecified: Secondary | ICD-10-CM | POA: Diagnosis not present

## 2013-01-24 DIAGNOSIS — I87319 Chronic venous hypertension (idiopathic) with ulcer of unspecified lower extremity: Secondary | ICD-10-CM | POA: Diagnosis not present

## 2013-01-24 DIAGNOSIS — L97209 Non-pressure chronic ulcer of unspecified calf with unspecified severity: Secondary | ICD-10-CM | POA: Diagnosis not present

## 2013-01-24 DIAGNOSIS — H4310 Vitreous hemorrhage, unspecified eye: Secondary | ICD-10-CM | POA: Diagnosis not present

## 2013-01-24 DIAGNOSIS — L97919 Non-pressure chronic ulcer of unspecified part of right lower leg with unspecified severity: Secondary | ICD-10-CM | POA: Diagnosis not present

## 2013-01-24 DIAGNOSIS — R609 Edema, unspecified: Secondary | ICD-10-CM | POA: Diagnosis not present

## 2013-01-24 DIAGNOSIS — E119 Type 2 diabetes mellitus without complications: Secondary | ICD-10-CM | POA: Diagnosis not present

## 2013-01-24 DIAGNOSIS — N189 Chronic kidney disease, unspecified: Secondary | ICD-10-CM | POA: Diagnosis not present

## 2013-01-28 DIAGNOSIS — I83229 Varicose veins of left lower extremity with both ulcer of unspecified site and inflammation: Secondary | ICD-10-CM | POA: Diagnosis not present

## 2013-01-28 DIAGNOSIS — I87319 Chronic venous hypertension (idiopathic) with ulcer of unspecified lower extremity: Secondary | ICD-10-CM | POA: Diagnosis not present

## 2013-01-28 DIAGNOSIS — B20 Human immunodeficiency virus [HIV] disease: Secondary | ICD-10-CM | POA: Diagnosis not present

## 2013-01-28 DIAGNOSIS — E118 Type 2 diabetes mellitus with unspecified complications: Secondary | ICD-10-CM | POA: Diagnosis not present

## 2013-01-28 DIAGNOSIS — Z79899 Other long term (current) drug therapy: Secondary | ICD-10-CM | POA: Diagnosis not present

## 2013-01-28 DIAGNOSIS — I83219 Varicose veins of right lower extremity with both ulcer of unspecified site and inflammation: Secondary | ICD-10-CM | POA: Diagnosis not present

## 2013-01-28 DIAGNOSIS — L97209 Non-pressure chronic ulcer of unspecified calf with unspecified severity: Secondary | ICD-10-CM | POA: Diagnosis not present

## 2013-01-28 DIAGNOSIS — R609 Edema, unspecified: Secondary | ICD-10-CM | POA: Diagnosis not present

## 2013-01-28 DIAGNOSIS — B182 Chronic viral hepatitis C: Secondary | ICD-10-CM | POA: Diagnosis not present

## 2013-02-04 DIAGNOSIS — L97919 Non-pressure chronic ulcer of unspecified part of right lower leg with unspecified severity: Secondary | ICD-10-CM | POA: Diagnosis not present

## 2013-02-04 DIAGNOSIS — R609 Edema, unspecified: Secondary | ICD-10-CM | POA: Diagnosis not present

## 2013-02-04 DIAGNOSIS — I83219 Varicose veins of right lower extremity with both ulcer of unspecified site and inflammation: Secondary | ICD-10-CM | POA: Diagnosis not present

## 2013-02-04 DIAGNOSIS — L97209 Non-pressure chronic ulcer of unspecified calf with unspecified severity: Secondary | ICD-10-CM | POA: Diagnosis not present

## 2013-02-04 DIAGNOSIS — I87319 Chronic venous hypertension (idiopathic) with ulcer of unspecified lower extremity: Secondary | ICD-10-CM | POA: Diagnosis not present

## 2013-02-04 DIAGNOSIS — L97929 Non-pressure chronic ulcer of unspecified part of left lower leg with unspecified severity: Secondary | ICD-10-CM | POA: Diagnosis not present

## 2013-02-04 DIAGNOSIS — L97909 Non-pressure chronic ulcer of unspecified part of unspecified lower leg with unspecified severity: Secondary | ICD-10-CM | POA: Diagnosis not present

## 2013-02-08 DIAGNOSIS — E118 Type 2 diabetes mellitus with unspecified complications: Secondary | ICD-10-CM | POA: Diagnosis not present

## 2013-02-08 DIAGNOSIS — B20 Human immunodeficiency virus [HIV] disease: Secondary | ICD-10-CM | POA: Diagnosis not present

## 2013-02-08 DIAGNOSIS — E785 Hyperlipidemia, unspecified: Secondary | ICD-10-CM | POA: Diagnosis not present

## 2013-02-10 DIAGNOSIS — I87319 Chronic venous hypertension (idiopathic) with ulcer of unspecified lower extremity: Secondary | ICD-10-CM | POA: Diagnosis not present

## 2013-02-10 DIAGNOSIS — N189 Chronic kidney disease, unspecified: Secondary | ICD-10-CM | POA: Diagnosis not present

## 2013-02-10 DIAGNOSIS — E119 Type 2 diabetes mellitus without complications: Secondary | ICD-10-CM | POA: Diagnosis not present

## 2013-02-10 DIAGNOSIS — L97209 Non-pressure chronic ulcer of unspecified calf with unspecified severity: Secondary | ICD-10-CM | POA: Diagnosis not present

## 2013-02-10 DIAGNOSIS — L97929 Non-pressure chronic ulcer of unspecified part of left lower leg with unspecified severity: Secondary | ICD-10-CM | POA: Diagnosis not present

## 2013-02-10 DIAGNOSIS — R609 Edema, unspecified: Secondary | ICD-10-CM | POA: Diagnosis not present

## 2013-02-15 DIAGNOSIS — R609 Edema, unspecified: Secondary | ICD-10-CM | POA: Diagnosis not present

## 2013-02-15 DIAGNOSIS — L97929 Non-pressure chronic ulcer of unspecified part of left lower leg with unspecified severity: Secondary | ICD-10-CM | POA: Diagnosis not present

## 2013-02-15 DIAGNOSIS — I87319 Chronic venous hypertension (idiopathic) with ulcer of unspecified lower extremity: Secondary | ICD-10-CM | POA: Diagnosis not present

## 2013-02-15 DIAGNOSIS — I83219 Varicose veins of right lower extremity with both ulcer of unspecified site and inflammation: Secondary | ICD-10-CM | POA: Diagnosis not present

## 2013-02-15 DIAGNOSIS — L97209 Non-pressure chronic ulcer of unspecified calf with unspecified severity: Secondary | ICD-10-CM | POA: Diagnosis not present

## 2013-02-17 ENCOUNTER — Encounter: Payer: Self-pay | Admitting: Cardiothoracic Surgery

## 2013-02-17 ENCOUNTER — Encounter: Payer: Self-pay | Admitting: Nurse Practitioner

## 2013-02-17 DIAGNOSIS — L97209 Non-pressure chronic ulcer of unspecified calf with unspecified severity: Secondary | ICD-10-CM | POA: Diagnosis not present

## 2013-02-17 DIAGNOSIS — R609 Edema, unspecified: Secondary | ICD-10-CM | POA: Diagnosis not present

## 2013-02-17 DIAGNOSIS — E119 Type 2 diabetes mellitus without complications: Secondary | ICD-10-CM | POA: Diagnosis not present

## 2013-02-17 DIAGNOSIS — I87319 Chronic venous hypertension (idiopathic) with ulcer of unspecified lower extremity: Secondary | ICD-10-CM | POA: Diagnosis not present

## 2013-02-17 DIAGNOSIS — I83219 Varicose veins of right lower extremity with both ulcer of unspecified site and inflammation: Secondary | ICD-10-CM | POA: Diagnosis not present

## 2013-02-17 DIAGNOSIS — N189 Chronic kidney disease, unspecified: Secondary | ICD-10-CM | POA: Diagnosis not present

## 2013-02-25 DIAGNOSIS — I83229 Varicose veins of left lower extremity with both ulcer of unspecified site and inflammation: Secondary | ICD-10-CM | POA: Diagnosis not present

## 2013-02-25 DIAGNOSIS — I83219 Varicose veins of right lower extremity with both ulcer of unspecified site and inflammation: Secondary | ICD-10-CM | POA: Diagnosis not present

## 2013-02-25 DIAGNOSIS — L97209 Non-pressure chronic ulcer of unspecified calf with unspecified severity: Secondary | ICD-10-CM | POA: Diagnosis not present

## 2013-02-25 DIAGNOSIS — R609 Edema, unspecified: Secondary | ICD-10-CM | POA: Diagnosis not present

## 2013-02-25 DIAGNOSIS — L97909 Non-pressure chronic ulcer of unspecified part of unspecified lower leg with unspecified severity: Secondary | ICD-10-CM | POA: Diagnosis not present

## 2013-02-25 DIAGNOSIS — I87319 Chronic venous hypertension (idiopathic) with ulcer of unspecified lower extremity: Secondary | ICD-10-CM | POA: Diagnosis not present

## 2013-03-04 DIAGNOSIS — L97929 Non-pressure chronic ulcer of unspecified part of left lower leg with unspecified severity: Secondary | ICD-10-CM | POA: Diagnosis not present

## 2013-03-04 DIAGNOSIS — E119 Type 2 diabetes mellitus without complications: Secondary | ICD-10-CM | POA: Diagnosis not present

## 2013-03-04 DIAGNOSIS — L97209 Non-pressure chronic ulcer of unspecified calf with unspecified severity: Secondary | ICD-10-CM | POA: Diagnosis not present

## 2013-03-04 DIAGNOSIS — R609 Edema, unspecified: Secondary | ICD-10-CM | POA: Diagnosis not present

## 2013-03-04 DIAGNOSIS — L97909 Non-pressure chronic ulcer of unspecified part of unspecified lower leg with unspecified severity: Secondary | ICD-10-CM | POA: Diagnosis not present

## 2013-03-04 DIAGNOSIS — I83219 Varicose veins of right lower extremity with both ulcer of unspecified site and inflammation: Secondary | ICD-10-CM | POA: Diagnosis not present

## 2013-03-04 DIAGNOSIS — I87319 Chronic venous hypertension (idiopathic) with ulcer of unspecified lower extremity: Secondary | ICD-10-CM | POA: Diagnosis not present

## 2013-03-04 DIAGNOSIS — N189 Chronic kidney disease, unspecified: Secondary | ICD-10-CM | POA: Diagnosis not present

## 2013-03-08 DIAGNOSIS — L97209 Non-pressure chronic ulcer of unspecified calf with unspecified severity: Secondary | ICD-10-CM | POA: Diagnosis not present

## 2013-03-08 DIAGNOSIS — I87319 Chronic venous hypertension (idiopathic) with ulcer of unspecified lower extremity: Secondary | ICD-10-CM | POA: Diagnosis not present

## 2013-03-08 DIAGNOSIS — R609 Edema, unspecified: Secondary | ICD-10-CM | POA: Diagnosis not present

## 2013-03-08 DIAGNOSIS — I83229 Varicose veins of left lower extremity with both ulcer of unspecified site and inflammation: Secondary | ICD-10-CM | POA: Diagnosis not present

## 2013-03-08 DIAGNOSIS — L97919 Non-pressure chronic ulcer of unspecified part of right lower leg with unspecified severity: Secondary | ICD-10-CM | POA: Diagnosis not present

## 2013-03-08 DIAGNOSIS — I83219 Varicose veins of right lower extremity with both ulcer of unspecified site and inflammation: Secondary | ICD-10-CM | POA: Diagnosis not present

## 2013-03-20 ENCOUNTER — Encounter: Payer: Self-pay | Admitting: Cardiothoracic Surgery

## 2013-03-20 ENCOUNTER — Encounter: Payer: Self-pay | Admitting: Nurse Practitioner

## 2013-03-20 DIAGNOSIS — L97209 Non-pressure chronic ulcer of unspecified calf with unspecified severity: Secondary | ICD-10-CM | POA: Diagnosis not present

## 2013-03-20 DIAGNOSIS — I87319 Chronic venous hypertension (idiopathic) with ulcer of unspecified lower extremity: Secondary | ICD-10-CM | POA: Diagnosis not present

## 2013-03-20 DIAGNOSIS — E119 Type 2 diabetes mellitus without complications: Secondary | ICD-10-CM | POA: Diagnosis not present

## 2013-03-20 DIAGNOSIS — I83219 Varicose veins of right lower extremity with both ulcer of unspecified site and inflammation: Secondary | ICD-10-CM | POA: Diagnosis not present

## 2013-03-20 DIAGNOSIS — N189 Chronic kidney disease, unspecified: Secondary | ICD-10-CM | POA: Diagnosis not present

## 2013-03-20 DIAGNOSIS — R609 Edema, unspecified: Secondary | ICD-10-CM | POA: Diagnosis not present

## 2013-03-25 DIAGNOSIS — L97209 Non-pressure chronic ulcer of unspecified calf with unspecified severity: Secondary | ICD-10-CM | POA: Diagnosis not present

## 2013-03-25 DIAGNOSIS — L97909 Non-pressure chronic ulcer of unspecified part of unspecified lower leg with unspecified severity: Secondary | ICD-10-CM | POA: Diagnosis not present

## 2013-03-25 DIAGNOSIS — N189 Chronic kidney disease, unspecified: Secondary | ICD-10-CM | POA: Diagnosis not present

## 2013-03-25 DIAGNOSIS — E119 Type 2 diabetes mellitus without complications: Secondary | ICD-10-CM | POA: Diagnosis not present

## 2013-03-25 DIAGNOSIS — I83219 Varicose veins of right lower extremity with both ulcer of unspecified site and inflammation: Secondary | ICD-10-CM | POA: Diagnosis not present

## 2013-03-25 DIAGNOSIS — I87319 Chronic venous hypertension (idiopathic) with ulcer of unspecified lower extremity: Secondary | ICD-10-CM | POA: Diagnosis not present

## 2013-03-25 DIAGNOSIS — R609 Edema, unspecified: Secondary | ICD-10-CM | POA: Diagnosis not present

## 2013-03-25 DIAGNOSIS — L97919 Non-pressure chronic ulcer of unspecified part of right lower leg with unspecified severity: Secondary | ICD-10-CM | POA: Diagnosis not present

## 2013-04-01 DIAGNOSIS — R609 Edema, unspecified: Secondary | ICD-10-CM | POA: Diagnosis not present

## 2013-04-01 DIAGNOSIS — L97929 Non-pressure chronic ulcer of unspecified part of left lower leg with unspecified severity: Secondary | ICD-10-CM | POA: Diagnosis not present

## 2013-04-01 DIAGNOSIS — I83219 Varicose veins of right lower extremity with both ulcer of unspecified site and inflammation: Secondary | ICD-10-CM | POA: Diagnosis not present

## 2013-04-01 DIAGNOSIS — I87319 Chronic venous hypertension (idiopathic) with ulcer of unspecified lower extremity: Secondary | ICD-10-CM | POA: Diagnosis not present

## 2013-04-01 DIAGNOSIS — L97909 Non-pressure chronic ulcer of unspecified part of unspecified lower leg with unspecified severity: Secondary | ICD-10-CM | POA: Diagnosis not present

## 2013-04-01 DIAGNOSIS — L97209 Non-pressure chronic ulcer of unspecified calf with unspecified severity: Secondary | ICD-10-CM | POA: Diagnosis not present

## 2013-04-15 DIAGNOSIS — I83219 Varicose veins of right lower extremity with both ulcer of unspecified site and inflammation: Secondary | ICD-10-CM | POA: Diagnosis not present

## 2013-04-15 DIAGNOSIS — I87319 Chronic venous hypertension (idiopathic) with ulcer of unspecified lower extremity: Secondary | ICD-10-CM | POA: Diagnosis not present

## 2013-04-15 DIAGNOSIS — L97209 Non-pressure chronic ulcer of unspecified calf with unspecified severity: Secondary | ICD-10-CM | POA: Diagnosis not present

## 2013-04-15 DIAGNOSIS — L97929 Non-pressure chronic ulcer of unspecified part of left lower leg with unspecified severity: Secondary | ICD-10-CM | POA: Diagnosis not present

## 2013-04-15 DIAGNOSIS — L97909 Non-pressure chronic ulcer of unspecified part of unspecified lower leg with unspecified severity: Secondary | ICD-10-CM | POA: Diagnosis not present

## 2013-04-15 DIAGNOSIS — R609 Edema, unspecified: Secondary | ICD-10-CM | POA: Diagnosis not present

## 2013-04-19 ENCOUNTER — Encounter: Payer: Self-pay | Admitting: Cardiothoracic Surgery

## 2013-04-19 ENCOUNTER — Encounter: Payer: Self-pay | Admitting: Nurse Practitioner

## 2013-04-19 DIAGNOSIS — N189 Chronic kidney disease, unspecified: Secondary | ICD-10-CM | POA: Diagnosis not present

## 2013-04-19 DIAGNOSIS — I87319 Chronic venous hypertension (idiopathic) with ulcer of unspecified lower extremity: Secondary | ICD-10-CM | POA: Diagnosis not present

## 2013-04-19 DIAGNOSIS — L97209 Non-pressure chronic ulcer of unspecified calf with unspecified severity: Secondary | ICD-10-CM | POA: Diagnosis not present

## 2013-04-19 DIAGNOSIS — L97909 Non-pressure chronic ulcer of unspecified part of unspecified lower leg with unspecified severity: Secondary | ICD-10-CM | POA: Diagnosis not present

## 2013-04-19 DIAGNOSIS — I83219 Varicose veins of right lower extremity with both ulcer of unspecified site and inflammation: Secondary | ICD-10-CM | POA: Diagnosis not present

## 2013-04-19 DIAGNOSIS — E119 Type 2 diabetes mellitus without complications: Secondary | ICD-10-CM | POA: Diagnosis not present

## 2013-04-19 DIAGNOSIS — L97929 Non-pressure chronic ulcer of unspecified part of left lower leg with unspecified severity: Secondary | ICD-10-CM | POA: Diagnosis not present

## 2013-04-19 DIAGNOSIS — R609 Edema, unspecified: Secondary | ICD-10-CM | POA: Diagnosis not present

## 2013-04-29 DIAGNOSIS — I519 Heart disease, unspecified: Secondary | ICD-10-CM | POA: Diagnosis not present

## 2013-04-29 DIAGNOSIS — I2510 Atherosclerotic heart disease of native coronary artery without angina pectoris: Secondary | ICD-10-CM | POA: Diagnosis not present

## 2013-04-29 DIAGNOSIS — I5022 Chronic systolic (congestive) heart failure: Secondary | ICD-10-CM | POA: Diagnosis not present

## 2013-04-29 DIAGNOSIS — I4949 Other premature depolarization: Secondary | ICD-10-CM | POA: Diagnosis not present

## 2013-05-05 DIAGNOSIS — N189 Chronic kidney disease, unspecified: Secondary | ICD-10-CM | POA: Diagnosis not present

## 2013-05-05 DIAGNOSIS — R609 Edema, unspecified: Secondary | ICD-10-CM | POA: Diagnosis not present

## 2013-05-05 DIAGNOSIS — I83229 Varicose veins of left lower extremity with both ulcer of unspecified site and inflammation: Secondary | ICD-10-CM | POA: Diagnosis not present

## 2013-05-05 DIAGNOSIS — L97209 Non-pressure chronic ulcer of unspecified calf with unspecified severity: Secondary | ICD-10-CM | POA: Diagnosis not present

## 2013-05-05 DIAGNOSIS — I87319 Chronic venous hypertension (idiopathic) with ulcer of unspecified lower extremity: Secondary | ICD-10-CM | POA: Diagnosis not present

## 2013-05-05 DIAGNOSIS — E119 Type 2 diabetes mellitus without complications: Secondary | ICD-10-CM | POA: Diagnosis not present

## 2013-05-12 DIAGNOSIS — R609 Edema, unspecified: Secondary | ICD-10-CM | POA: Diagnosis not present

## 2013-05-12 DIAGNOSIS — L97929 Non-pressure chronic ulcer of unspecified part of left lower leg with unspecified severity: Secondary | ICD-10-CM | POA: Diagnosis not present

## 2013-05-12 DIAGNOSIS — L97209 Non-pressure chronic ulcer of unspecified calf with unspecified severity: Secondary | ICD-10-CM | POA: Diagnosis not present

## 2013-05-12 DIAGNOSIS — I87319 Chronic venous hypertension (idiopathic) with ulcer of unspecified lower extremity: Secondary | ICD-10-CM | POA: Diagnosis not present

## 2013-05-12 DIAGNOSIS — N189 Chronic kidney disease, unspecified: Secondary | ICD-10-CM | POA: Diagnosis not present

## 2013-05-12 DIAGNOSIS — E119 Type 2 diabetes mellitus without complications: Secondary | ICD-10-CM | POA: Diagnosis not present

## 2013-05-17 DIAGNOSIS — I87319 Chronic venous hypertension (idiopathic) with ulcer of unspecified lower extremity: Secondary | ICD-10-CM | POA: Diagnosis not present

## 2013-05-17 DIAGNOSIS — R609 Edema, unspecified: Secondary | ICD-10-CM | POA: Diagnosis not present

## 2013-05-17 DIAGNOSIS — L97209 Non-pressure chronic ulcer of unspecified calf with unspecified severity: Secondary | ICD-10-CM | POA: Diagnosis not present

## 2013-05-17 DIAGNOSIS — L97919 Non-pressure chronic ulcer of unspecified part of right lower leg with unspecified severity: Secondary | ICD-10-CM | POA: Diagnosis not present

## 2013-05-17 DIAGNOSIS — I83219 Varicose veins of right lower extremity with both ulcer of unspecified site and inflammation: Secondary | ICD-10-CM | POA: Diagnosis not present

## 2013-05-17 DIAGNOSIS — L97929 Non-pressure chronic ulcer of unspecified part of left lower leg with unspecified severity: Secondary | ICD-10-CM | POA: Diagnosis not present

## 2013-05-20 ENCOUNTER — Encounter: Payer: Self-pay | Admitting: Nurse Practitioner

## 2013-05-20 ENCOUNTER — Encounter: Payer: Self-pay | Admitting: Cardiothoracic Surgery

## 2013-05-20 DIAGNOSIS — L97209 Non-pressure chronic ulcer of unspecified calf with unspecified severity: Secondary | ICD-10-CM | POA: Diagnosis not present

## 2013-05-20 DIAGNOSIS — I87319 Chronic venous hypertension (idiopathic) with ulcer of unspecified lower extremity: Secondary | ICD-10-CM | POA: Diagnosis not present

## 2013-05-20 DIAGNOSIS — R609 Edema, unspecified: Secondary | ICD-10-CM | POA: Diagnosis not present

## 2013-05-20 DIAGNOSIS — N189 Chronic kidney disease, unspecified: Secondary | ICD-10-CM | POA: Diagnosis not present

## 2013-05-20 DIAGNOSIS — E1129 Type 2 diabetes mellitus with other diabetic kidney complication: Secondary | ICD-10-CM | POA: Diagnosis not present

## 2013-05-20 DIAGNOSIS — I83219 Varicose veins of right lower extremity with both ulcer of unspecified site and inflammation: Secondary | ICD-10-CM | POA: Diagnosis not present

## 2013-05-24 DIAGNOSIS — I83229 Varicose veins of left lower extremity with both ulcer of unspecified site and inflammation: Secondary | ICD-10-CM | POA: Diagnosis not present

## 2013-05-24 DIAGNOSIS — I83219 Varicose veins of right lower extremity with both ulcer of unspecified site and inflammation: Secondary | ICD-10-CM | POA: Diagnosis not present

## 2013-05-24 DIAGNOSIS — R609 Edema, unspecified: Secondary | ICD-10-CM | POA: Diagnosis not present

## 2013-05-24 DIAGNOSIS — L97929 Non-pressure chronic ulcer of unspecified part of left lower leg with unspecified severity: Secondary | ICD-10-CM | POA: Diagnosis not present

## 2013-05-24 DIAGNOSIS — L97209 Non-pressure chronic ulcer of unspecified calf with unspecified severity: Secondary | ICD-10-CM | POA: Diagnosis not present

## 2013-05-24 DIAGNOSIS — I87319 Chronic venous hypertension (idiopathic) with ulcer of unspecified lower extremity: Secondary | ICD-10-CM | POA: Diagnosis not present

## 2013-05-24 DIAGNOSIS — L97909 Non-pressure chronic ulcer of unspecified part of unspecified lower leg with unspecified severity: Secondary | ICD-10-CM | POA: Diagnosis not present

## 2013-05-31 DIAGNOSIS — R609 Edema, unspecified: Secondary | ICD-10-CM | POA: Diagnosis not present

## 2013-05-31 DIAGNOSIS — L97209 Non-pressure chronic ulcer of unspecified calf with unspecified severity: Secondary | ICD-10-CM | POA: Diagnosis not present

## 2013-05-31 DIAGNOSIS — I83219 Varicose veins of right lower extremity with both ulcer of unspecified site and inflammation: Secondary | ICD-10-CM | POA: Diagnosis not present

## 2013-05-31 DIAGNOSIS — L97929 Non-pressure chronic ulcer of unspecified part of left lower leg with unspecified severity: Secondary | ICD-10-CM | POA: Diagnosis not present

## 2013-05-31 DIAGNOSIS — I87319 Chronic venous hypertension (idiopathic) with ulcer of unspecified lower extremity: Secondary | ICD-10-CM | POA: Diagnosis not present

## 2013-05-31 DIAGNOSIS — L97909 Non-pressure chronic ulcer of unspecified part of unspecified lower leg with unspecified severity: Secondary | ICD-10-CM | POA: Diagnosis not present

## 2013-06-07 DIAGNOSIS — I83219 Varicose veins of right lower extremity with both ulcer of unspecified site and inflammation: Secondary | ICD-10-CM | POA: Diagnosis not present

## 2013-06-07 DIAGNOSIS — R609 Edema, unspecified: Secondary | ICD-10-CM | POA: Diagnosis not present

## 2013-06-07 DIAGNOSIS — L97209 Non-pressure chronic ulcer of unspecified calf with unspecified severity: Secondary | ICD-10-CM | POA: Diagnosis not present

## 2013-06-07 DIAGNOSIS — I87319 Chronic venous hypertension (idiopathic) with ulcer of unspecified lower extremity: Secondary | ICD-10-CM | POA: Diagnosis not present

## 2013-06-10 LAB — WOUND AEROBIC CULTURE

## 2013-06-14 DIAGNOSIS — I87319 Chronic venous hypertension (idiopathic) with ulcer of unspecified lower extremity: Secondary | ICD-10-CM | POA: Diagnosis not present

## 2013-06-14 DIAGNOSIS — I83219 Varicose veins of right lower extremity with both ulcer of unspecified site and inflammation: Secondary | ICD-10-CM | POA: Diagnosis not present

## 2013-06-14 DIAGNOSIS — L97209 Non-pressure chronic ulcer of unspecified calf with unspecified severity: Secondary | ICD-10-CM | POA: Diagnosis not present

## 2013-06-14 DIAGNOSIS — R609 Edema, unspecified: Secondary | ICD-10-CM | POA: Diagnosis not present

## 2013-06-20 ENCOUNTER — Encounter: Payer: Self-pay | Admitting: Cardiothoracic Surgery

## 2013-06-20 DIAGNOSIS — R609 Edema, unspecified: Secondary | ICD-10-CM | POA: Diagnosis not present

## 2013-06-20 DIAGNOSIS — N189 Chronic kidney disease, unspecified: Secondary | ICD-10-CM | POA: Diagnosis not present

## 2013-06-20 DIAGNOSIS — E1129 Type 2 diabetes mellitus with other diabetic kidney complication: Secondary | ICD-10-CM | POA: Diagnosis not present

## 2013-06-20 DIAGNOSIS — L97209 Non-pressure chronic ulcer of unspecified calf with unspecified severity: Secondary | ICD-10-CM | POA: Diagnosis not present

## 2013-06-20 DIAGNOSIS — I87319 Chronic venous hypertension (idiopathic) with ulcer of unspecified lower extremity: Secondary | ICD-10-CM | POA: Diagnosis not present

## 2013-06-21 DIAGNOSIS — R609 Edema, unspecified: Secondary | ICD-10-CM | POA: Diagnosis not present

## 2013-06-21 DIAGNOSIS — L97209 Non-pressure chronic ulcer of unspecified calf with unspecified severity: Secondary | ICD-10-CM | POA: Diagnosis not present

## 2013-06-21 DIAGNOSIS — I83219 Varicose veins of right lower extremity with both ulcer of unspecified site and inflammation: Secondary | ICD-10-CM | POA: Diagnosis not present

## 2013-06-21 DIAGNOSIS — I87319 Chronic venous hypertension (idiopathic) with ulcer of unspecified lower extremity: Secondary | ICD-10-CM | POA: Diagnosis not present

## 2013-07-05 DIAGNOSIS — R609 Edema, unspecified: Secondary | ICD-10-CM | POA: Diagnosis not present

## 2013-07-05 DIAGNOSIS — I87319 Chronic venous hypertension (idiopathic) with ulcer of unspecified lower extremity: Secondary | ICD-10-CM | POA: Diagnosis not present

## 2013-07-05 DIAGNOSIS — I83219 Varicose veins of right lower extremity with both ulcer of unspecified site and inflammation: Secondary | ICD-10-CM | POA: Diagnosis not present

## 2013-07-05 DIAGNOSIS — L97209 Non-pressure chronic ulcer of unspecified calf with unspecified severity: Secondary | ICD-10-CM | POA: Diagnosis not present

## 2013-07-20 ENCOUNTER — Encounter: Payer: Self-pay | Admitting: Cardiothoracic Surgery

## 2013-07-20 DIAGNOSIS — Z79899 Other long term (current) drug therapy: Secondary | ICD-10-CM | POA: Diagnosis not present

## 2013-07-20 DIAGNOSIS — E119 Type 2 diabetes mellitus without complications: Secondary | ICD-10-CM | POA: Diagnosis not present

## 2013-07-20 DIAGNOSIS — L97209 Non-pressure chronic ulcer of unspecified calf with unspecified severity: Secondary | ICD-10-CM | POA: Diagnosis not present

## 2013-07-20 DIAGNOSIS — R609 Edema, unspecified: Secondary | ICD-10-CM | POA: Diagnosis not present

## 2013-07-20 DIAGNOSIS — I87319 Chronic venous hypertension (idiopathic) with ulcer of unspecified lower extremity: Secondary | ICD-10-CM | POA: Diagnosis not present

## 2013-07-20 DIAGNOSIS — A4902 Methicillin resistant Staphylococcus aureus infection, unspecified site: Secondary | ICD-10-CM | POA: Diagnosis not present

## 2013-07-20 DIAGNOSIS — N189 Chronic kidney disease, unspecified: Secondary | ICD-10-CM | POA: Diagnosis not present

## 2013-07-25 DIAGNOSIS — E1139 Type 2 diabetes mellitus with other diabetic ophthalmic complication: Secondary | ICD-10-CM | POA: Diagnosis not present

## 2013-07-25 DIAGNOSIS — E11359 Type 2 diabetes mellitus with proliferative diabetic retinopathy without macular edema: Secondary | ICD-10-CM | POA: Diagnosis not present

## 2013-07-26 DIAGNOSIS — I87319 Chronic venous hypertension (idiopathic) with ulcer of unspecified lower extremity: Secondary | ICD-10-CM | POA: Diagnosis not present

## 2013-07-26 DIAGNOSIS — R609 Edema, unspecified: Secondary | ICD-10-CM | POA: Diagnosis not present

## 2013-07-26 DIAGNOSIS — I83219 Varicose veins of right lower extremity with both ulcer of unspecified site and inflammation: Secondary | ICD-10-CM | POA: Diagnosis not present

## 2013-07-26 DIAGNOSIS — L97209 Non-pressure chronic ulcer of unspecified calf with unspecified severity: Secondary | ICD-10-CM | POA: Diagnosis not present

## 2013-08-02 DIAGNOSIS — E118 Type 2 diabetes mellitus with unspecified complications: Secondary | ICD-10-CM | POA: Diagnosis not present

## 2013-08-02 DIAGNOSIS — I509 Heart failure, unspecified: Secondary | ICD-10-CM | POA: Diagnosis not present

## 2013-08-02 DIAGNOSIS — B20 Human immunodeficiency virus [HIV] disease: Secondary | ICD-10-CM | POA: Diagnosis not present

## 2013-08-02 DIAGNOSIS — B182 Chronic viral hepatitis C: Secondary | ICD-10-CM | POA: Diagnosis not present

## 2013-08-08 DIAGNOSIS — B20 Human immunodeficiency virus [HIV] disease: Secondary | ICD-10-CM | POA: Diagnosis not present

## 2013-08-08 DIAGNOSIS — E118 Type 2 diabetes mellitus with unspecified complications: Secondary | ICD-10-CM | POA: Diagnosis not present

## 2013-08-08 DIAGNOSIS — E785 Hyperlipidemia, unspecified: Secondary | ICD-10-CM | POA: Diagnosis not present

## 2013-08-09 ENCOUNTER — Encounter: Payer: Self-pay | Admitting: Surgery

## 2013-08-16 DIAGNOSIS — L97209 Non-pressure chronic ulcer of unspecified calf with unspecified severity: Secondary | ICD-10-CM | POA: Diagnosis not present

## 2013-08-16 DIAGNOSIS — R609 Edema, unspecified: Secondary | ICD-10-CM | POA: Diagnosis not present

## 2013-08-16 DIAGNOSIS — I83219 Varicose veins of right lower extremity with both ulcer of unspecified site and inflammation: Secondary | ICD-10-CM | POA: Diagnosis not present

## 2013-08-16 DIAGNOSIS — I87319 Chronic venous hypertension (idiopathic) with ulcer of unspecified lower extremity: Secondary | ICD-10-CM | POA: Diagnosis not present

## 2013-08-20 ENCOUNTER — Encounter: Payer: Self-pay | Admitting: Cardiothoracic Surgery

## 2013-08-22 ENCOUNTER — Encounter (HOSPITAL_BASED_OUTPATIENT_CLINIC_OR_DEPARTMENT_OTHER): Payer: Federal, State, Local not specified - PPO | Attending: Plastic Surgery

## 2013-08-26 DIAGNOSIS — R002 Palpitations: Secondary | ICD-10-CM | POA: Diagnosis not present

## 2013-08-26 DIAGNOSIS — I2510 Atherosclerotic heart disease of native coronary artery without angina pectoris: Secondary | ICD-10-CM | POA: Diagnosis not present

## 2013-08-26 DIAGNOSIS — I4891 Unspecified atrial fibrillation: Secondary | ICD-10-CM | POA: Diagnosis not present

## 2013-08-26 DIAGNOSIS — I519 Heart disease, unspecified: Secondary | ICD-10-CM | POA: Diagnosis not present

## 2013-09-19 ENCOUNTER — Encounter: Payer: Self-pay | Admitting: Cardiothoracic Surgery

## 2013-09-19 DIAGNOSIS — R609 Edema, unspecified: Secondary | ICD-10-CM | POA: Diagnosis not present

## 2013-09-19 DIAGNOSIS — L97209 Non-pressure chronic ulcer of unspecified calf with unspecified severity: Secondary | ICD-10-CM | POA: Diagnosis not present

## 2013-09-19 DIAGNOSIS — N189 Chronic kidney disease, unspecified: Secondary | ICD-10-CM | POA: Diagnosis not present

## 2013-09-19 DIAGNOSIS — I87319 Chronic venous hypertension (idiopathic) with ulcer of unspecified lower extremity: Secondary | ICD-10-CM | POA: Diagnosis not present

## 2013-09-19 DIAGNOSIS — A4902 Methicillin resistant Staphylococcus aureus infection, unspecified site: Secondary | ICD-10-CM | POA: Diagnosis not present

## 2013-09-19 DIAGNOSIS — E119 Type 2 diabetes mellitus without complications: Secondary | ICD-10-CM | POA: Diagnosis not present

## 2013-09-27 DIAGNOSIS — I83219 Varicose veins of right lower extremity with both ulcer of unspecified site and inflammation: Secondary | ICD-10-CM | POA: Diagnosis not present

## 2013-09-27 DIAGNOSIS — L97209 Non-pressure chronic ulcer of unspecified calf with unspecified severity: Secondary | ICD-10-CM | POA: Diagnosis not present

## 2013-09-27 DIAGNOSIS — R609 Edema, unspecified: Secondary | ICD-10-CM | POA: Diagnosis not present

## 2013-09-27 DIAGNOSIS — I87319 Chronic venous hypertension (idiopathic) with ulcer of unspecified lower extremity: Secondary | ICD-10-CM | POA: Diagnosis not present

## 2013-10-06 DIAGNOSIS — R002 Palpitations: Secondary | ICD-10-CM | POA: Diagnosis not present

## 2013-10-06 DIAGNOSIS — I10 Essential (primary) hypertension: Secondary | ICD-10-CM | POA: Diagnosis not present

## 2013-10-06 DIAGNOSIS — I209 Angina pectoris, unspecified: Secondary | ICD-10-CM | POA: Diagnosis not present

## 2013-10-06 DIAGNOSIS — F4310 Post-traumatic stress disorder, unspecified: Secondary | ICD-10-CM | POA: Diagnosis not present

## 2013-10-11 DIAGNOSIS — I5022 Chronic systolic (congestive) heart failure: Secondary | ICD-10-CM | POA: Diagnosis not present

## 2013-10-11 DIAGNOSIS — I129 Hypertensive chronic kidney disease with stage 1 through stage 4 chronic kidney disease, or unspecified chronic kidney disease: Secondary | ICD-10-CM | POA: Diagnosis not present

## 2013-10-11 DIAGNOSIS — D696 Thrombocytopenia, unspecified: Secondary | ICD-10-CM | POA: Diagnosis not present

## 2013-10-11 DIAGNOSIS — B20 Human immunodeficiency virus [HIV] disease: Secondary | ICD-10-CM | POA: Diagnosis not present

## 2013-10-11 DIAGNOSIS — B1920 Unspecified viral hepatitis C without hepatic coma: Secondary | ICD-10-CM | POA: Diagnosis not present

## 2013-10-11 DIAGNOSIS — Z79899 Other long term (current) drug therapy: Secondary | ICD-10-CM | POA: Diagnosis not present

## 2013-10-11 DIAGNOSIS — N189 Chronic kidney disease, unspecified: Secondary | ICD-10-CM | POA: Diagnosis not present

## 2013-10-11 DIAGNOSIS — E119 Type 2 diabetes mellitus without complications: Secondary | ICD-10-CM | POA: Diagnosis not present

## 2013-10-11 DIAGNOSIS — I509 Heart failure, unspecified: Secondary | ICD-10-CM | POA: Diagnosis not present

## 2013-10-11 DIAGNOSIS — Z21 Asymptomatic human immunodeficiency virus [HIV] infection status: Secondary | ICD-10-CM | POA: Diagnosis not present

## 2013-10-11 DIAGNOSIS — B182 Chronic viral hepatitis C: Secondary | ICD-10-CM | POA: Diagnosis not present

## 2013-10-17 ENCOUNTER — Encounter (HOSPITAL_BASED_OUTPATIENT_CLINIC_OR_DEPARTMENT_OTHER): Payer: Medicare Other | Attending: Plastic Surgery

## 2013-10-17 DIAGNOSIS — S8990XA Unspecified injury of unspecified lower leg, initial encounter: Secondary | ICD-10-CM | POA: Diagnosis not present

## 2013-10-17 DIAGNOSIS — E119 Type 2 diabetes mellitus without complications: Secondary | ICD-10-CM | POA: Insufficient documentation

## 2013-10-17 DIAGNOSIS — Z79899 Other long term (current) drug therapy: Secondary | ICD-10-CM | POA: Diagnosis not present

## 2013-10-18 NOTE — Progress Notes (Signed)
[facility]  NAME:  Robert Bentley, Robert Bentley NO.:  [id number]  MEDICAL RECORD NO.:  [id number]      DATE OF BIRTH:  November 29, 1940  PHYSICIAN:  Theodoro Kos, DO       VISIT DATE:  10/17/2013                                  OFFICE VISIT   HISTORY OF PRESENT ILLNESS:  The patient is a 71 year old male who has had a wound on his right lower leg medial superior to the medial malleolus.  He has had this for the past 20-30 years.  He sustained it well para shooting out of a plane.  He has undergone surgical intervention with Dr. Beverly Sessions and wound care with Dr. Genevive Bi at [place]. He has had silver alginate, Apligraf, and notes that the one he can remember as of recently, and at this point, he says that his leg looks the best it has looked in a very long time.  The wound is medial superior to the medial malleolus.  The area is approximately 0.5 x 0.5 x 0.1.  The surrounding area have some obvious scarring.  PAST MEDICAL HISTORY:  Positive for DVT, heart attack, diabetes, __________ and he has had repair of compound fracture of that left leg.  MEDICATIONS:  Glyburide, Januvia, Imdur, Plavix.  ALLERGIES:  He is allergic to MORPHINE.  He gets Norco from Dr. Genevive Bi.  He has undergone vascular studies.  I do not see a recent pre-albumin but his last hemoglobin A1c was reasonable.  REVIEW OF SYSTEMS:  Otherwise negative.  PHYSICAL EXAMINATION:  GENERAL:  He is alert, oriented, and cooperative. He is pleasant for the exam. HEENT:  His pupils are equal. NECK:  He does not have any cervical lymphadenopathy. LUNGS:  His breathing is unlabored. CARDIOVASCULAR:  His heart rate is regular. ABDOMEN:  Soft and nontender. EXTREMITIES:  The lower extremity has somewhat of a compression stocking on the left leg.  The right leg has some severe vascular disease noted. The pulses are not palpable, but the foot is warm.  The wound is described above.  RECOMMEND:  No  aggressive treatment as this is the best that it has been, the patient does not want any aggressive treatment.  I have ordered for ACell __________we have to debride the area he does not want to have that done.  I think Apligraf may work for him.  Recommend protein intake with Glucerna for diabetic protein intake, multivitamin, vitamin C, elevation, compression, and check a pre-albumin, and we are available if needed.     Theodoro Kos, DO     CS/MEDQ  D:  10/17/2013  T:  10/18/2013  Job:  [id number]  cc:   Dr. Genevive Bi

## 2013-11-01 ENCOUNTER — Encounter: Payer: Self-pay | Admitting: Cardiothoracic Surgery

## 2013-11-01 DIAGNOSIS — I87319 Chronic venous hypertension (idiopathic) with ulcer of unspecified lower extremity: Secondary | ICD-10-CM | POA: Diagnosis not present

## 2013-11-01 DIAGNOSIS — L97209 Non-pressure chronic ulcer of unspecified calf with unspecified severity: Secondary | ICD-10-CM | POA: Diagnosis not present

## 2013-11-01 DIAGNOSIS — N189 Chronic kidney disease, unspecified: Secondary | ICD-10-CM | POA: Diagnosis not present

## 2013-11-01 DIAGNOSIS — E119 Type 2 diabetes mellitus without complications: Secondary | ICD-10-CM | POA: Diagnosis not present

## 2013-11-01 DIAGNOSIS — R609 Edema, unspecified: Secondary | ICD-10-CM | POA: Diagnosis not present

## 2013-11-01 DIAGNOSIS — A4902 Methicillin resistant Staphylococcus aureus infection, unspecified site: Secondary | ICD-10-CM | POA: Diagnosis not present

## 2013-11-08 ENCOUNTER — Inpatient Hospital Stay: Payer: Self-pay | Admitting: Cardiology

## 2013-11-08 ENCOUNTER — Other Ambulatory Visit: Payer: Self-pay | Admitting: Cardiology

## 2013-11-08 DIAGNOSIS — I2510 Atherosclerotic heart disease of native coronary artery without angina pectoris: Secondary | ICD-10-CM | POA: Diagnosis not present

## 2013-11-08 DIAGNOSIS — N183 Chronic kidney disease, stage 3 unspecified: Secondary | ICD-10-CM | POA: Diagnosis not present

## 2013-11-08 DIAGNOSIS — I5022 Chronic systolic (congestive) heart failure: Secondary | ICD-10-CM | POA: Diagnosis not present

## 2013-11-08 DIAGNOSIS — B1920 Unspecified viral hepatitis C without hepatic coma: Secondary | ICD-10-CM | POA: Diagnosis present

## 2013-11-08 DIAGNOSIS — I2589 Other forms of chronic ischemic heart disease: Secondary | ICD-10-CM | POA: Diagnosis present

## 2013-11-08 DIAGNOSIS — E875 Hyperkalemia: Secondary | ICD-10-CM | POA: Diagnosis not present

## 2013-11-08 DIAGNOSIS — I447 Left bundle-branch block, unspecified: Secondary | ICD-10-CM | POA: Diagnosis not present

## 2013-11-08 DIAGNOSIS — I252 Old myocardial infarction: Secondary | ICD-10-CM | POA: Diagnosis not present

## 2013-11-08 DIAGNOSIS — Z7982 Long term (current) use of aspirin: Secondary | ICD-10-CM | POA: Diagnosis not present

## 2013-11-08 DIAGNOSIS — Z21 Asymptomatic human immunodeficiency virus [HIV] infection status: Secondary | ICD-10-CM | POA: Diagnosis present

## 2013-11-08 DIAGNOSIS — N179 Acute kidney failure, unspecified: Secondary | ICD-10-CM | POA: Diagnosis not present

## 2013-11-08 DIAGNOSIS — E119 Type 2 diabetes mellitus without complications: Secondary | ICD-10-CM | POA: Diagnosis present

## 2013-11-08 DIAGNOSIS — I428 Other cardiomyopathies: Secondary | ICD-10-CM | POA: Diagnosis not present

## 2013-11-08 DIAGNOSIS — Z7902 Long term (current) use of antithrombotics/antiplatelets: Secondary | ICD-10-CM | POA: Diagnosis not present

## 2013-11-08 DIAGNOSIS — I509 Heart failure, unspecified: Secondary | ICD-10-CM | POA: Diagnosis present

## 2013-11-08 DIAGNOSIS — I4891 Unspecified atrial fibrillation: Secondary | ICD-10-CM | POA: Diagnosis present

## 2013-11-08 DIAGNOSIS — R809 Proteinuria, unspecified: Secondary | ICD-10-CM | POA: Diagnosis not present

## 2013-11-08 DIAGNOSIS — R5383 Other fatigue: Secondary | ICD-10-CM | POA: Diagnosis not present

## 2013-11-08 DIAGNOSIS — R5381 Other malaise: Secondary | ICD-10-CM | POA: Diagnosis not present

## 2013-11-08 LAB — CBC WITH DIFFERENTIAL/PLATELET
Basophil #: 0.1 10*3/uL (ref 0.0–0.1)
Basophil %: 0.7 %
Eosinophil #: 0.1 10*3/uL (ref 0.0–0.7)
Eosinophil %: 0.6 %
HCT: 40.2 % (ref 40.0–52.0)
HGB: 13.7 g/dL (ref 13.0–18.0)
LYMPHS ABS: 2.6 10*3/uL (ref 1.0–3.6)
Lymphocyte %: 27.2 %
MCH: 38.9 pg — AB (ref 26.0–34.0)
MCHC: 33.9 g/dL (ref 32.0–36.0)
MCV: 115 fL — ABNORMAL HIGH (ref 80–100)
MONOS PCT: 9.7 %
Monocyte #: 0.9 x10 3/mm (ref 0.2–1.0)
Neutrophil #: 5.8 10*3/uL (ref 1.4–6.5)
Neutrophil %: 61.8 %
Platelet: 91 10*3/uL — ABNORMAL LOW (ref 150–440)
RBC: 3.51 10*6/uL — ABNORMAL LOW (ref 4.40–5.90)
RDW: 13.2 % (ref 11.5–14.5)
WBC: 9.4 10*3/uL (ref 3.8–10.6)

## 2013-11-08 LAB — BASIC METABOLIC PANEL
ANION GAP: 3 — AB (ref 7–16)
Anion Gap: 6 — ABNORMAL LOW (ref 7–16)
BUN: 55 mg/dL — ABNORMAL HIGH (ref 7–18)
BUN: 55 mg/dL — AB (ref 7–18)
CHLORIDE: 105 mmol/L (ref 98–107)
CO2: 21 mmol/L (ref 21–32)
Calcium, Total: 8.2 mg/dL — ABNORMAL LOW (ref 8.5–10.1)
Calcium, Total: 8.7 mg/dL (ref 8.5–10.1)
Chloride: 106 mmol/L (ref 98–107)
Co2: 21 mmol/L (ref 21–32)
Creatinine: 2.6 mg/dL — ABNORMAL HIGH (ref 0.60–1.30)
Creatinine: 2.63 mg/dL — ABNORMAL HIGH (ref 0.60–1.30)
EGFR (Non-African Amer.): 24 — ABNORMAL LOW
EGFR (Non-African Amer.): 24 — ABNORMAL LOW
GFR CALC AF AMER: 28 — AB
GFR CALC AF AMER: 27 — AB
GLUCOSE: 282 mg/dL — AB (ref 65–99)
Glucose: 216 mg/dL — ABNORMAL HIGH (ref 65–99)
OSMOLALITY: 284 (ref 275–301)
OSMOLALITY: 288 (ref 275–301)
Potassium: 7.6 mmol/L (ref 3.5–5.1)
Potassium: 6.5 mmol/L (ref 3.5–5.1)
SODIUM: 129 mmol/L — AB (ref 136–145)
Sodium: 133 mmol/L — ABNORMAL LOW (ref 136–145)

## 2013-11-08 LAB — SODIUM, URINE, RANDOM: Sodium, Urine Random: 23 mmol/L (ref 20–110)

## 2013-11-08 LAB — CK TOTAL AND CKMB (NOT AT ARMC)
CK, Total: 221 U/L (ref 35–232)
CK-MB: 5.4 ng/mL — ABNORMAL HIGH (ref 0.5–3.6)

## 2013-11-08 LAB — CK: CK, TOTAL: 207 U/L (ref 35–232)

## 2013-11-08 LAB — URINALYSIS, COMPLETE
Bilirubin,UR: NEGATIVE
Glucose,UR: 150 mg/dL (ref 0–75)
Granular Cast: 3
Hyaline Cast: 7
Ketone: NEGATIVE
Leukocyte Esterase: NEGATIVE
Nitrite: NEGATIVE
Ph: 5 (ref 4.5–8.0)
RBC,UR: 1 /HPF (ref 0–5)
SPECIFIC GRAVITY: 1.011 (ref 1.003–1.030)
Transitional Epi: <1

## 2013-11-08 LAB — TSH: THYROID STIMULATING HORM: 5.24 u[IU]/mL — AB

## 2013-11-08 LAB — TROPONIN I
Troponin-I: 0.18 ng/mL — ABNORMAL HIGH
Troponin-I: 0.18 ng/mL — ABNORMAL HIGH

## 2013-11-08 LAB — PROTEIN / CREATININE RATIO, URINE
CREATININE, URINE: 78.4 mg/dL (ref 30.0–125.0)
PROTEIN, RANDOM URINE: 234 mg/dL — AB (ref 0–12)
Protein/Creat. Ratio: 2985 mg/gCREAT — ABNORMAL HIGH (ref 0–200)

## 2013-11-08 LAB — PHOSPHORUS: Phosphorus: 4 mg/dL (ref 2.5–4.9)

## 2013-11-08 LAB — MAGNESIUM: MAGNESIUM: 2.1 mg/dL

## 2013-11-09 LAB — BASIC METABOLIC PANEL
ANION GAP: 9 (ref 7–16)
BUN: 51 mg/dL — AB (ref 7–18)
CO2: 21 mmol/L (ref 21–32)
Calcium, Total: 7.6 mg/dL — ABNORMAL LOW (ref 8.5–10.1)
Chloride: 113 mmol/L — ABNORMAL HIGH (ref 98–107)
Creatinine: 2.36 mg/dL — ABNORMAL HIGH (ref 0.60–1.30)
EGFR (African American): 31 — ABNORMAL LOW
EGFR (Non-African Amer.): 27 — ABNORMAL LOW
Glucose: 101 mg/dL — ABNORMAL HIGH (ref 65–99)
Osmolality: 299 (ref 275–301)
POTASSIUM: 5.3 mmol/L — AB (ref 3.5–5.1)
Sodium: 143 mmol/L (ref 136–145)

## 2013-11-09 LAB — CK: CK, TOTAL: 157 U/L (ref 35–232)

## 2013-11-10 LAB — BASIC METABOLIC PANEL
Anion Gap: 6 — ABNORMAL LOW (ref 7–16)
BUN: 34 mg/dL — ABNORMAL HIGH (ref 7–18)
CALCIUM: 8 mg/dL — AB (ref 8.5–10.1)
Chloride: 115 mmol/L — ABNORMAL HIGH (ref 98–107)
Co2: 21 mmol/L (ref 21–32)
Creatinine: 1.78 mg/dL — ABNORMAL HIGH (ref 0.60–1.30)
EGFR (African American): 44 — ABNORMAL LOW
EGFR (Non-African Amer.): 38 — ABNORMAL LOW
Glucose: 77 mg/dL (ref 65–99)
Osmolality: 290 (ref 275–301)
POTASSIUM: 4.8 mmol/L (ref 3.5–5.1)
Sodium: 142 mmol/L (ref 136–145)

## 2013-11-10 LAB — UR PROT ELECTROPHORESIS, URINE RANDOM

## 2013-11-10 LAB — PROTEIN ELECTROPHORESIS(ARMC)

## 2013-11-17 DIAGNOSIS — I519 Heart disease, unspecified: Secondary | ICD-10-CM | POA: Diagnosis not present

## 2013-11-17 DIAGNOSIS — I4949 Other premature depolarization: Secondary | ICD-10-CM | POA: Diagnosis not present

## 2013-11-17 DIAGNOSIS — I4891 Unspecified atrial fibrillation: Secondary | ICD-10-CM | POA: Diagnosis not present

## 2013-11-17 DIAGNOSIS — I2510 Atherosclerotic heart disease of native coronary artery without angina pectoris: Secondary | ICD-10-CM | POA: Diagnosis not present

## 2013-11-20 ENCOUNTER — Encounter: Payer: Self-pay | Admitting: Cardiothoracic Surgery

## 2013-12-09 ENCOUNTER — Inpatient Hospital Stay: Payer: Self-pay | Admitting: Internal Medicine

## 2013-12-09 DIAGNOSIS — I129 Hypertensive chronic kidney disease with stage 1 through stage 4 chronic kidney disease, or unspecified chronic kidney disease: Secondary | ICD-10-CM | POA: Diagnosis present

## 2013-12-09 DIAGNOSIS — N183 Chronic kidney disease, stage 3 unspecified: Secondary | ICD-10-CM | POA: Diagnosis not present

## 2013-12-09 DIAGNOSIS — J9610 Chronic respiratory failure, unspecified whether with hypoxia or hypercapnia: Secondary | ICD-10-CM | POA: Diagnosis present

## 2013-12-09 DIAGNOSIS — Z9861 Coronary angioplasty status: Secondary | ICD-10-CM | POA: Diagnosis not present

## 2013-12-09 DIAGNOSIS — R079 Chest pain, unspecified: Secondary | ICD-10-CM | POA: Diagnosis not present

## 2013-12-09 DIAGNOSIS — E872 Acidosis, unspecified: Secondary | ICD-10-CM | POA: Diagnosis present

## 2013-12-09 DIAGNOSIS — N179 Acute kidney failure, unspecified: Secondary | ICD-10-CM | POA: Diagnosis not present

## 2013-12-09 DIAGNOSIS — R042 Hemoptysis: Secondary | ICD-10-CM | POA: Diagnosis not present

## 2013-12-09 DIAGNOSIS — I5023 Acute on chronic systolic (congestive) heart failure: Secondary | ICD-10-CM | POA: Diagnosis present

## 2013-12-09 DIAGNOSIS — I252 Old myocardial infarction: Secondary | ICD-10-CM | POA: Diagnosis not present

## 2013-12-09 DIAGNOSIS — I2589 Other forms of chronic ischemic heart disease: Secondary | ICD-10-CM | POA: Diagnosis present

## 2013-12-09 DIAGNOSIS — I509 Heart failure, unspecified: Secondary | ICD-10-CM | POA: Diagnosis not present

## 2013-12-09 DIAGNOSIS — J209 Acute bronchitis, unspecified: Secondary | ICD-10-CM | POA: Diagnosis present

## 2013-12-09 DIAGNOSIS — Z87891 Personal history of nicotine dependence: Secondary | ICD-10-CM | POA: Diagnosis not present

## 2013-12-09 DIAGNOSIS — I4891 Unspecified atrial fibrillation: Secondary | ICD-10-CM | POA: Diagnosis not present

## 2013-12-09 DIAGNOSIS — R072 Precordial pain: Secondary | ICD-10-CM | POA: Diagnosis not present

## 2013-12-09 DIAGNOSIS — Z21 Asymptomatic human immunodeficiency virus [HIV] infection status: Secondary | ICD-10-CM | POA: Diagnosis present

## 2013-12-09 DIAGNOSIS — E875 Hyperkalemia: Secondary | ICD-10-CM | POA: Diagnosis not present

## 2013-12-09 DIAGNOSIS — E119 Type 2 diabetes mellitus without complications: Secondary | ICD-10-CM | POA: Diagnosis not present

## 2013-12-09 DIAGNOSIS — L97309 Non-pressure chronic ulcer of unspecified ankle with unspecified severity: Secondary | ICD-10-CM | POA: Diagnosis present

## 2013-12-09 DIAGNOSIS — R000 Tachycardia, unspecified: Secondary | ICD-10-CM | POA: Diagnosis not present

## 2013-12-09 DIAGNOSIS — B1920 Unspecified viral hepatitis C without hepatic coma: Secondary | ICD-10-CM | POA: Diagnosis present

## 2013-12-09 DIAGNOSIS — I2510 Atherosclerotic heart disease of native coronary artery without angina pectoris: Secondary | ICD-10-CM | POA: Diagnosis not present

## 2013-12-09 DIAGNOSIS — Z7902 Long term (current) use of antithrombotics/antiplatelets: Secondary | ICD-10-CM | POA: Diagnosis not present

## 2013-12-09 DIAGNOSIS — K922 Gastrointestinal hemorrhage, unspecified: Secondary | ICD-10-CM | POA: Diagnosis not present

## 2013-12-09 DIAGNOSIS — R188 Other ascites: Secondary | ICD-10-CM | POA: Diagnosis not present

## 2013-12-09 DIAGNOSIS — J40 Bronchitis, not specified as acute or chronic: Secondary | ICD-10-CM | POA: Diagnosis not present

## 2013-12-09 DIAGNOSIS — R0602 Shortness of breath: Secondary | ICD-10-CM | POA: Diagnosis not present

## 2013-12-09 DIAGNOSIS — Z7982 Long term (current) use of aspirin: Secondary | ICD-10-CM | POA: Diagnosis not present

## 2013-12-09 DIAGNOSIS — I4892 Unspecified atrial flutter: Secondary | ICD-10-CM | POA: Diagnosis not present

## 2013-12-09 DIAGNOSIS — R0989 Other specified symptoms and signs involving the circulatory and respiratory systems: Secondary | ICD-10-CM | POA: Diagnosis not present

## 2013-12-09 LAB — COMPREHENSIVE METABOLIC PANEL
ALBUMIN: 3.1 g/dL — AB (ref 3.4–5.0)
ALK PHOS: 154 U/L — AB
AST: 31 U/L (ref 15–37)
Anion Gap: 5 — ABNORMAL LOW (ref 7–16)
BILIRUBIN TOTAL: 3.6 mg/dL — AB (ref 0.2–1.0)
BUN: 34 mg/dL — ABNORMAL HIGH (ref 7–18)
CALCIUM: 8.1 mg/dL — AB (ref 8.5–10.1)
Chloride: 111 mmol/L — ABNORMAL HIGH (ref 98–107)
Co2: 20 mmol/L — ABNORMAL LOW (ref 21–32)
Creatinine: 2.02 mg/dL — ABNORMAL HIGH (ref 0.60–1.30)
EGFR (African American): 38 — ABNORMAL LOW
EGFR (Non-African Amer.): 32 — ABNORMAL LOW
Glucose: 90 mg/dL (ref 65–99)
Osmolality: 279 (ref 275–301)
POTASSIUM: 5.5 mmol/L — AB (ref 3.5–5.1)
SGPT (ALT): 24 U/L (ref 12–78)
Sodium: 136 mmol/L (ref 136–145)
Total Protein: 7 g/dL (ref 6.4–8.2)

## 2013-12-09 LAB — CBC
HCT: 38.4 % — ABNORMAL LOW (ref 40.0–52.0)
HGB: 12.9 g/dL — ABNORMAL LOW (ref 13.0–18.0)
MCH: 39.4 pg — AB (ref 26.0–34.0)
MCHC: 33.6 g/dL (ref 32.0–36.0)
MCV: 117 fL — ABNORMAL HIGH (ref 80–100)
Platelet: 68 10*3/uL — ABNORMAL LOW (ref 150–440)
RBC: 3.28 10*6/uL — AB (ref 4.40–5.90)
RDW: 13.9 % (ref 11.5–14.5)
WBC: 5.8 10*3/uL (ref 3.8–10.6)

## 2013-12-09 LAB — PROTIME-INR
INR: 1.4
Prothrombin Time: 16.6 secs — ABNORMAL HIGH (ref 11.5–14.7)

## 2013-12-09 LAB — PRO B NATRIURETIC PEPTIDE: B-TYPE NATIURETIC PEPTID: 12869 pg/mL — AB (ref 0–125)

## 2013-12-09 LAB — TROPONIN I
Troponin-I: 0.05 ng/mL
Troponin-I: 0.05 ng/mL

## 2013-12-09 LAB — CK-MB
CK-MB: 4.6 ng/mL — ABNORMAL HIGH (ref 0.5–3.6)
CK-MB: 3.5 ng/mL (ref 0.5–3.6)

## 2013-12-09 LAB — APTT: ACTIVATED PTT: 28.6 s (ref 23.6–35.9)

## 2013-12-09 LAB — MAGNESIUM: Magnesium: 1.8 mg/dL

## 2013-12-10 DIAGNOSIS — R0989 Other specified symptoms and signs involving the circulatory and respiratory systems: Secondary | ICD-10-CM | POA: Diagnosis not present

## 2013-12-10 DIAGNOSIS — I4891 Unspecified atrial fibrillation: Secondary | ICD-10-CM | POA: Diagnosis not present

## 2013-12-10 DIAGNOSIS — I2510 Atherosclerotic heart disease of native coronary artery without angina pectoris: Secondary | ICD-10-CM | POA: Diagnosis not present

## 2013-12-10 DIAGNOSIS — E119 Type 2 diabetes mellitus without complications: Secondary | ICD-10-CM | POA: Diagnosis not present

## 2013-12-10 DIAGNOSIS — I4892 Unspecified atrial flutter: Secondary | ICD-10-CM | POA: Diagnosis not present

## 2013-12-10 DIAGNOSIS — I509 Heart failure, unspecified: Secondary | ICD-10-CM | POA: Diagnosis not present

## 2013-12-10 DIAGNOSIS — E875 Hyperkalemia: Secondary | ICD-10-CM | POA: Diagnosis not present

## 2013-12-10 DIAGNOSIS — N179 Acute kidney failure, unspecified: Secondary | ICD-10-CM | POA: Diagnosis not present

## 2013-12-10 DIAGNOSIS — J40 Bronchitis, not specified as acute or chronic: Secondary | ICD-10-CM | POA: Diagnosis not present

## 2013-12-10 DIAGNOSIS — K922 Gastrointestinal hemorrhage, unspecified: Secondary | ICD-10-CM | POA: Diagnosis not present

## 2013-12-10 LAB — CBC WITH DIFFERENTIAL/PLATELET
BASOS PCT: 0.5 %
Basophil #: 0 10*3/uL (ref 0.0–0.1)
Eosinophil #: 0 10*3/uL (ref 0.0–0.7)
Eosinophil %: 0.7 %
HCT: 34.6 % — AB (ref 40.0–52.0)
HGB: 11.5 g/dL — ABNORMAL LOW (ref 13.0–18.0)
Lymphocyte #: 1 10*3/uL (ref 1.0–3.6)
Lymphocyte %: 16.8 %
MCH: 38.9 pg — ABNORMAL HIGH (ref 26.0–34.0)
MCHC: 33.2 g/dL (ref 32.0–36.0)
MCV: 117 fL — ABNORMAL HIGH (ref 80–100)
MONOS PCT: 13.9 %
Monocyte #: 0.8 x10 3/mm (ref 0.2–1.0)
NEUTROS PCT: 68.1 %
Neutrophil #: 4.1 10*3/uL (ref 1.4–6.5)
Platelet: 57 10*3/uL — ABNORMAL LOW (ref 150–440)
RBC: 2.95 10*6/uL — ABNORMAL LOW (ref 4.40–5.90)
RDW: 13.8 % (ref 11.5–14.5)
WBC: 6.1 10*3/uL (ref 3.8–10.6)

## 2013-12-10 LAB — TROPONIN I: Troponin-I: 0.07 ng/mL — ABNORMAL HIGH

## 2013-12-10 LAB — TSH: THYROID STIMULATING HORM: 2.95 u[IU]/mL

## 2013-12-10 LAB — LIPID PANEL
CHOLESTEROL: 69 mg/dL (ref 0–200)
HDL Cholesterol: 21 mg/dL — ABNORMAL LOW (ref 40–60)
LDL CHOLESTEROL, CALC: 32 mg/dL (ref 0–100)
TRIGLYCERIDES: 80 mg/dL (ref 0–200)
VLDL Cholesterol, Calc: 16 mg/dL (ref 5–40)

## 2013-12-10 LAB — BASIC METABOLIC PANEL
Anion Gap: 5 — ABNORMAL LOW (ref 7–16)
BUN: 33 mg/dL — AB (ref 7–18)
Calcium, Total: 8.1 mg/dL — ABNORMAL LOW (ref 8.5–10.1)
Chloride: 112 mmol/L — ABNORMAL HIGH (ref 98–107)
Co2: 20 mmol/L — ABNORMAL LOW (ref 21–32)
Creatinine: 2.05 mg/dL — ABNORMAL HIGH (ref 0.60–1.30)
EGFR (African American): 37 — ABNORMAL LOW
EGFR (Non-African Amer.): 32 — ABNORMAL LOW
GLUCOSE: 59 mg/dL — AB (ref 65–99)
Osmolality: 279 (ref 275–301)
POTASSIUM: 5.7 mmol/L — AB (ref 3.5–5.1)
Sodium: 137 mmol/L (ref 136–145)

## 2013-12-10 LAB — CK-MB: CK-MB: 2.8 ng/mL (ref 0.5–3.6)

## 2013-12-11 DIAGNOSIS — E119 Type 2 diabetes mellitus without complications: Secondary | ICD-10-CM | POA: Diagnosis not present

## 2013-12-11 DIAGNOSIS — I4891 Unspecified atrial fibrillation: Secondary | ICD-10-CM | POA: Diagnosis not present

## 2013-12-11 DIAGNOSIS — E875 Hyperkalemia: Secondary | ICD-10-CM | POA: Diagnosis not present

## 2013-12-11 DIAGNOSIS — J40 Bronchitis, not specified as acute or chronic: Secondary | ICD-10-CM | POA: Diagnosis not present

## 2013-12-11 DIAGNOSIS — I4892 Unspecified atrial flutter: Secondary | ICD-10-CM | POA: Diagnosis not present

## 2013-12-11 DIAGNOSIS — I2510 Atherosclerotic heart disease of native coronary artery without angina pectoris: Secondary | ICD-10-CM | POA: Diagnosis not present

## 2013-12-11 DIAGNOSIS — N179 Acute kidney failure, unspecified: Secondary | ICD-10-CM | POA: Diagnosis not present

## 2013-12-11 DIAGNOSIS — I509 Heart failure, unspecified: Secondary | ICD-10-CM | POA: Diagnosis not present

## 2013-12-11 DIAGNOSIS — K922 Gastrointestinal hemorrhage, unspecified: Secondary | ICD-10-CM | POA: Diagnosis not present

## 2013-12-11 LAB — BASIC METABOLIC PANEL
ANION GAP: 7 (ref 7–16)
BUN: 41 mg/dL — ABNORMAL HIGH (ref 7–18)
CO2: 17 mmol/L — AB (ref 21–32)
Calcium, Total: 7.8 mg/dL — ABNORMAL LOW (ref 8.5–10.1)
Chloride: 111 mmol/L — ABNORMAL HIGH (ref 98–107)
Creatinine: 2.43 mg/dL — ABNORMAL HIGH (ref 0.60–1.30)
EGFR (African American): 30 — ABNORMAL LOW
GFR CALC NON AF AMER: 26 — AB
Glucose: 69 mg/dL (ref 65–99)
Osmolality: 279 (ref 275–301)
Potassium: 5.2 mmol/L — ABNORMAL HIGH (ref 3.5–5.1)
SODIUM: 135 mmol/L — AB (ref 136–145)

## 2013-12-11 LAB — CBC WITH DIFFERENTIAL/PLATELET
BASOS ABS: 0 10*3/uL (ref 0.0–0.1)
BASOS PCT: 0.3 %
EOS ABS: 0.1 10*3/uL (ref 0.0–0.7)
EOS PCT: 2.4 %
HCT: 34.9 % — ABNORMAL LOW (ref 40.0–52.0)
HGB: 11.5 g/dL — ABNORMAL LOW (ref 13.0–18.0)
LYMPHS PCT: 15.7 %
Lymphocyte #: 0.8 10*3/uL — ABNORMAL LOW (ref 1.0–3.6)
MCH: 38.5 pg — AB (ref 26.0–34.0)
MCHC: 33.1 g/dL (ref 32.0–36.0)
MCV: 116 fL — AB (ref 80–100)
MONOS PCT: 14.4 %
Monocyte #: 0.7 x10 3/mm (ref 0.2–1.0)
NEUTROS PCT: 67.2 %
Neutrophil #: 3.4 10*3/uL (ref 1.4–6.5)
Platelet: 53 10*3/uL — ABNORMAL LOW (ref 150–440)
RBC: 3 10*6/uL — ABNORMAL LOW (ref 4.40–5.90)
RDW: 13.9 % (ref 11.5–14.5)
WBC: 5 10*3/uL (ref 3.8–10.6)

## 2013-12-12 DIAGNOSIS — E119 Type 2 diabetes mellitus without complications: Secondary | ICD-10-CM | POA: Diagnosis not present

## 2013-12-12 DIAGNOSIS — J40 Bronchitis, not specified as acute or chronic: Secondary | ICD-10-CM | POA: Diagnosis not present

## 2013-12-12 DIAGNOSIS — N179 Acute kidney failure, unspecified: Secondary | ICD-10-CM | POA: Diagnosis not present

## 2013-12-12 DIAGNOSIS — E875 Hyperkalemia: Secondary | ICD-10-CM | POA: Diagnosis not present

## 2013-12-12 DIAGNOSIS — I509 Heart failure, unspecified: Secondary | ICD-10-CM | POA: Diagnosis not present

## 2013-12-12 DIAGNOSIS — I4891 Unspecified atrial fibrillation: Secondary | ICD-10-CM | POA: Diagnosis not present

## 2013-12-12 DIAGNOSIS — R188 Other ascites: Secondary | ICD-10-CM | POA: Diagnosis not present

## 2013-12-12 DIAGNOSIS — I2510 Atherosclerotic heart disease of native coronary artery without angina pectoris: Secondary | ICD-10-CM | POA: Diagnosis not present

## 2013-12-12 LAB — URINALYSIS, COMPLETE
BACTERIA: NONE SEEN
Bilirubin,UR: NEGATIVE
KETONE: NEGATIVE
LEUKOCYTE ESTERASE: NEGATIVE
NITRITE: NEGATIVE
Ph: 5 (ref 4.5–8.0)
SQUAMOUS EPITHELIAL: NONE SEEN
Specific Gravity: 1.017 (ref 1.003–1.030)

## 2013-12-12 LAB — BASIC METABOLIC PANEL
ANION GAP: 8 (ref 7–16)
Anion Gap: 5 — ABNORMAL LOW (ref 7–16)
BUN: 47 mg/dL — AB (ref 7–18)
BUN: 44 mg/dL — ABNORMAL HIGH (ref 7–18)
CALCIUM: 7.7 mg/dL — AB (ref 8.5–10.1)
CALCIUM: 7.6 mg/dL — AB (ref 8.5–10.1)
CHLORIDE: 109 mmol/L — AB (ref 98–107)
CREATININE: 2.64 mg/dL — AB (ref 0.60–1.30)
CREATININE: 2.73 mg/dL — AB (ref 0.60–1.30)
Chloride: 109 mmol/L — ABNORMAL HIGH (ref 98–107)
Co2: 20 mmol/L — ABNORMAL LOW (ref 21–32)
Co2: 22 mmol/L (ref 21–32)
EGFR (African American): 27 — ABNORMAL LOW
EGFR (African American): 26 — ABNORMAL LOW
GFR CALC NON AF AMER: 23 — AB
GFR CALC NON AF AMER: 23 — AB
GLUCOSE: 68 mg/dL (ref 65–99)
Glucose: 89 mg/dL (ref 65–99)
OSMOLALITY: 286 (ref 275–301)
Osmolality: 281 (ref 275–301)
POTASSIUM: 5.4 mmol/L — AB (ref 3.5–5.1)
Potassium: 5.2 mmol/L — ABNORMAL HIGH (ref 3.5–5.1)
SODIUM: 136 mmol/L (ref 136–145)
Sodium: 137 mmol/L (ref 136–145)

## 2013-12-12 LAB — PROTEIN / CREATININE RATIO, URINE
Creatinine, Urine: 183 mg/dL — ABNORMAL HIGH (ref 30.0–125.0)
PROTEIN/CREAT. RATIO: 1536 mg/g{creat} — AB (ref 0–200)
Protein, Random Urine: 281 mg/dL — ABNORMAL HIGH (ref 0–12)

## 2013-12-13 DIAGNOSIS — E119 Type 2 diabetes mellitus without complications: Secondary | ICD-10-CM | POA: Diagnosis not present

## 2013-12-13 DIAGNOSIS — I2510 Atherosclerotic heart disease of native coronary artery without angina pectoris: Secondary | ICD-10-CM | POA: Diagnosis not present

## 2013-12-13 DIAGNOSIS — E875 Hyperkalemia: Secondary | ICD-10-CM | POA: Diagnosis not present

## 2013-12-13 DIAGNOSIS — N179 Acute kidney failure, unspecified: Secondary | ICD-10-CM | POA: Diagnosis not present

## 2013-12-13 DIAGNOSIS — I4891 Unspecified atrial fibrillation: Secondary | ICD-10-CM | POA: Diagnosis not present

## 2013-12-13 DIAGNOSIS — I509 Heart failure, unspecified: Secondary | ICD-10-CM | POA: Diagnosis not present

## 2013-12-13 DIAGNOSIS — J40 Bronchitis, not specified as acute or chronic: Secondary | ICD-10-CM | POA: Diagnosis not present

## 2013-12-13 LAB — CBC WITH DIFFERENTIAL/PLATELET
BASOS PCT: 0.2 %
Basophil #: 0 10*3/uL (ref 0.0–0.1)
Eosinophil #: 0 10*3/uL (ref 0.0–0.7)
Eosinophil %: 0.3 %
HCT: 33.9 % — AB (ref 40.0–52.0)
HGB: 11.5 g/dL — AB (ref 13.0–18.0)
LYMPHS ABS: 0.9 10*3/uL — AB (ref 1.0–3.6)
Lymphocyte %: 17.2 %
MCH: 39.5 pg — ABNORMAL HIGH (ref 26.0–34.0)
MCHC: 34 g/dL (ref 32.0–36.0)
MCV: 116 fL — ABNORMAL HIGH (ref 80–100)
MONO ABS: 0.5 x10 3/mm (ref 0.2–1.0)
Monocyte %: 10.1 %
NEUTROS ABS: 3.7 10*3/uL (ref 1.4–6.5)
Neutrophil %: 72.2 %
Platelet: 48 10*3/uL — ABNORMAL LOW (ref 150–440)
RBC: 2.91 10*6/uL — ABNORMAL LOW (ref 4.40–5.90)
RDW: 13.5 % (ref 11.5–14.5)
WBC: 5.1 10*3/uL (ref 3.8–10.6)

## 2013-12-13 LAB — BASIC METABOLIC PANEL
Anion Gap: 9 (ref 7–16)
BUN: 45 mg/dL — ABNORMAL HIGH (ref 7–18)
CREATININE: 2.48 mg/dL — AB (ref 0.60–1.30)
Calcium, Total: 7.7 mg/dL — ABNORMAL LOW (ref 8.5–10.1)
Chloride: 107 mmol/L (ref 98–107)
Co2: 19 mmol/L — ABNORMAL LOW (ref 21–32)
EGFR (Non-African Amer.): 25 — ABNORMAL LOW
GFR CALC AF AMER: 29 — AB
GLUCOSE: 81 mg/dL (ref 65–99)
OSMOLALITY: 281 (ref 275–301)
Potassium: 5.4 mmol/L — ABNORMAL HIGH (ref 3.5–5.1)
SODIUM: 135 mmol/L — AB (ref 136–145)

## 2013-12-20 ENCOUNTER — Encounter: Payer: Self-pay | Admitting: Cardiothoracic Surgery

## 2013-12-20 DIAGNOSIS — E119 Type 2 diabetes mellitus without complications: Secondary | ICD-10-CM | POA: Diagnosis not present

## 2013-12-20 DIAGNOSIS — L97909 Non-pressure chronic ulcer of unspecified part of unspecified lower leg with unspecified severity: Secondary | ICD-10-CM | POA: Diagnosis not present

## 2013-12-20 DIAGNOSIS — I519 Heart disease, unspecified: Secondary | ICD-10-CM | POA: Diagnosis not present

## 2013-12-20 DIAGNOSIS — E875 Hyperkalemia: Secondary | ICD-10-CM | POA: Diagnosis not present

## 2013-12-20 DIAGNOSIS — N183 Chronic kidney disease, stage 3 unspecified: Secondary | ICD-10-CM | POA: Diagnosis not present

## 2013-12-20 DIAGNOSIS — L97209 Non-pressure chronic ulcer of unspecified calf with unspecified severity: Secondary | ICD-10-CM | POA: Diagnosis not present

## 2013-12-20 DIAGNOSIS — A4902 Methicillin resistant Staphylococcus aureus infection, unspecified site: Secondary | ICD-10-CM | POA: Diagnosis not present

## 2013-12-20 DIAGNOSIS — N189 Chronic kidney disease, unspecified: Secondary | ICD-10-CM | POA: Diagnosis not present

## 2013-12-20 DIAGNOSIS — R809 Proteinuria, unspecified: Secondary | ICD-10-CM | POA: Diagnosis not present

## 2013-12-20 DIAGNOSIS — R609 Edema, unspecified: Secondary | ICD-10-CM | POA: Diagnosis not present

## 2013-12-20 DIAGNOSIS — I5022 Chronic systolic (congestive) heart failure: Secondary | ICD-10-CM | POA: Diagnosis not present

## 2013-12-20 DIAGNOSIS — I87319 Chronic venous hypertension (idiopathic) with ulcer of unspecified lower extremity: Secondary | ICD-10-CM | POA: Diagnosis not present

## 2013-12-20 DIAGNOSIS — N179 Acute kidney failure, unspecified: Secondary | ICD-10-CM | POA: Diagnosis not present

## 2013-12-20 DIAGNOSIS — I2510 Atherosclerotic heart disease of native coronary artery without angina pectoris: Secondary | ICD-10-CM | POA: Diagnosis not present

## 2013-12-26 LAB — EXPECTORATED SPUTUM ASSESSMENT W GRAM STAIN, RFLX TO RESP C

## 2013-12-30 DIAGNOSIS — D631 Anemia in chronic kidney disease: Secondary | ICD-10-CM | POA: Diagnosis not present

## 2013-12-30 DIAGNOSIS — R609 Edema, unspecified: Secondary | ICD-10-CM | POA: Diagnosis not present

## 2013-12-30 DIAGNOSIS — N2581 Secondary hyperparathyroidism of renal origin: Secondary | ICD-10-CM | POA: Diagnosis not present

## 2013-12-30 DIAGNOSIS — N183 Chronic kidney disease, stage 3 unspecified: Secondary | ICD-10-CM | POA: Diagnosis not present

## 2013-12-30 DIAGNOSIS — R809 Proteinuria, unspecified: Secondary | ICD-10-CM | POA: Diagnosis not present

## 2014-01-10 DIAGNOSIS — D631 Anemia in chronic kidney disease: Secondary | ICD-10-CM | POA: Diagnosis not present

## 2014-01-10 DIAGNOSIS — R609 Edema, unspecified: Secondary | ICD-10-CM | POA: Diagnosis not present

## 2014-01-10 DIAGNOSIS — N183 Chronic kidney disease, stage 3 unspecified: Secondary | ICD-10-CM | POA: Diagnosis not present

## 2014-01-10 DIAGNOSIS — R809 Proteinuria, unspecified: Secondary | ICD-10-CM | POA: Diagnosis not present

## 2014-01-10 DIAGNOSIS — N2581 Secondary hyperparathyroidism of renal origin: Secondary | ICD-10-CM | POA: Diagnosis not present

## 2014-01-11 DIAGNOSIS — I5022 Chronic systolic (congestive) heart failure: Secondary | ICD-10-CM | POA: Diagnosis not present

## 2014-01-11 DIAGNOSIS — I2510 Atherosclerotic heart disease of native coronary artery without angina pectoris: Secondary | ICD-10-CM | POA: Diagnosis not present

## 2014-01-11 DIAGNOSIS — R0609 Other forms of dyspnea: Secondary | ICD-10-CM | POA: Diagnosis not present

## 2014-01-11 DIAGNOSIS — R0989 Other specified symptoms and signs involving the circulatory and respiratory systems: Secondary | ICD-10-CM | POA: Diagnosis not present

## 2014-01-11 DIAGNOSIS — I519 Heart disease, unspecified: Secondary | ICD-10-CM | POA: Diagnosis not present

## 2014-01-18 ENCOUNTER — Ambulatory Visit: Payer: Self-pay | Admitting: Oncology

## 2014-01-18 ENCOUNTER — Inpatient Hospital Stay: Payer: Self-pay | Admitting: Specialist

## 2014-01-18 DIAGNOSIS — I8000 Phlebitis and thrombophlebitis of superficial vessels of unspecified lower extremity: Secondary | ICD-10-CM | POA: Diagnosis not present

## 2014-01-18 DIAGNOSIS — I824Y9 Acute embolism and thrombosis of unspecified deep veins of unspecified proximal lower extremity: Secondary | ICD-10-CM | POA: Diagnosis not present

## 2014-01-18 DIAGNOSIS — J189 Pneumonia, unspecified organism: Secondary | ICD-10-CM | POA: Diagnosis not present

## 2014-01-18 DIAGNOSIS — J90 Pleural effusion, not elsewhere classified: Secondary | ICD-10-CM | POA: Diagnosis not present

## 2014-01-18 DIAGNOSIS — I2589 Other forms of chronic ischemic heart disease: Secondary | ICD-10-CM | POA: Diagnosis present

## 2014-01-18 DIAGNOSIS — R000 Tachycardia, unspecified: Secondary | ICD-10-CM | POA: Diagnosis not present

## 2014-01-18 DIAGNOSIS — Z21 Asymptomatic human immunodeficiency virus [HIV] infection status: Secondary | ICD-10-CM | POA: Diagnosis present

## 2014-01-18 DIAGNOSIS — N189 Chronic kidney disease, unspecified: Secondary | ICD-10-CM | POA: Diagnosis not present

## 2014-01-18 DIAGNOSIS — N184 Chronic kidney disease, stage 4 (severe): Secondary | ICD-10-CM | POA: Diagnosis not present

## 2014-01-18 DIAGNOSIS — I130 Hypertensive heart and chronic kidney disease with heart failure and stage 1 through stage 4 chronic kidney disease, or unspecified chronic kidney disease: Secondary | ICD-10-CM | POA: Diagnosis present

## 2014-01-18 DIAGNOSIS — I5023 Acute on chronic systolic (congestive) heart failure: Secondary | ICD-10-CM | POA: Diagnosis not present

## 2014-01-18 DIAGNOSIS — I428 Other cardiomyopathies: Secondary | ICD-10-CM | POA: Diagnosis not present

## 2014-01-18 DIAGNOSIS — I5021 Acute systolic (congestive) heart failure: Secondary | ICD-10-CM | POA: Diagnosis not present

## 2014-01-18 DIAGNOSIS — N179 Acute kidney failure, unspecified: Secondary | ICD-10-CM | POA: Diagnosis not present

## 2014-01-18 DIAGNOSIS — Z7902 Long term (current) use of antithrombotics/antiplatelets: Secondary | ICD-10-CM | POA: Diagnosis not present

## 2014-01-18 DIAGNOSIS — Z7901 Long term (current) use of anticoagulants: Secondary | ICD-10-CM | POA: Diagnosis not present

## 2014-01-18 DIAGNOSIS — R748 Abnormal levels of other serum enzymes: Secondary | ICD-10-CM | POA: Diagnosis not present

## 2014-01-18 DIAGNOSIS — N183 Chronic kidney disease, stage 3 unspecified: Secondary | ICD-10-CM | POA: Diagnosis not present

## 2014-01-18 DIAGNOSIS — B20 Human immunodeficiency virus [HIV] disease: Secondary | ICD-10-CM | POA: Diagnosis not present

## 2014-01-18 DIAGNOSIS — Z7982 Long term (current) use of aspirin: Secondary | ICD-10-CM | POA: Diagnosis not present

## 2014-01-18 DIAGNOSIS — R0602 Shortness of breath: Secondary | ICD-10-CM | POA: Diagnosis not present

## 2014-01-18 DIAGNOSIS — E873 Alkalosis: Secondary | ICD-10-CM | POA: Diagnosis not present

## 2014-01-18 DIAGNOSIS — F411 Generalized anxiety disorder: Secondary | ICD-10-CM | POA: Diagnosis present

## 2014-01-18 DIAGNOSIS — R0989 Other specified symptoms and signs involving the circulatory and respiratory systems: Secondary | ICD-10-CM | POA: Diagnosis not present

## 2014-01-18 DIAGNOSIS — Z87891 Personal history of nicotine dependence: Secondary | ICD-10-CM | POA: Diagnosis not present

## 2014-01-18 DIAGNOSIS — I248 Other forms of acute ischemic heart disease: Secondary | ICD-10-CM | POA: Diagnosis not present

## 2014-01-18 DIAGNOSIS — I4891 Unspecified atrial fibrillation: Secondary | ICD-10-CM | POA: Diagnosis not present

## 2014-01-18 DIAGNOSIS — I5022 Chronic systolic (congestive) heart failure: Secondary | ICD-10-CM | POA: Diagnosis not present

## 2014-01-18 DIAGNOSIS — R799 Abnormal finding of blood chemistry, unspecified: Secondary | ICD-10-CM | POA: Diagnosis not present

## 2014-01-18 DIAGNOSIS — I2510 Atherosclerotic heart disease of native coronary artery without angina pectoris: Secondary | ICD-10-CM | POA: Diagnosis present

## 2014-01-18 DIAGNOSIS — D696 Thrombocytopenia, unspecified: Secondary | ICD-10-CM | POA: Diagnosis not present

## 2014-01-18 DIAGNOSIS — B1920 Unspecified viral hepatitis C without hepatic coma: Secondary | ICD-10-CM | POA: Diagnosis present

## 2014-01-18 DIAGNOSIS — N170 Acute kidney failure with tubular necrosis: Secondary | ICD-10-CM | POA: Diagnosis not present

## 2014-01-18 DIAGNOSIS — Z9861 Coronary angioplasty status: Secondary | ICD-10-CM | POA: Diagnosis not present

## 2014-01-18 DIAGNOSIS — R809 Proteinuria, unspecified: Secondary | ICD-10-CM | POA: Diagnosis not present

## 2014-01-18 DIAGNOSIS — R110 Nausea: Secondary | ICD-10-CM | POA: Diagnosis not present

## 2014-01-18 DIAGNOSIS — R079 Chest pain, unspecified: Secondary | ICD-10-CM | POA: Diagnosis not present

## 2014-01-18 DIAGNOSIS — K7689 Other specified diseases of liver: Secondary | ICD-10-CM | POA: Diagnosis not present

## 2014-01-18 DIAGNOSIS — R609 Edema, unspecified: Secondary | ICD-10-CM | POA: Diagnosis not present

## 2014-01-18 DIAGNOSIS — H409 Unspecified glaucoma: Secondary | ICD-10-CM | POA: Diagnosis present

## 2014-01-18 DIAGNOSIS — I82409 Acute embolism and thrombosis of unspecified deep veins of unspecified lower extremity: Secondary | ICD-10-CM | POA: Diagnosis not present

## 2014-01-18 DIAGNOSIS — I509 Heart failure, unspecified: Secondary | ICD-10-CM | POA: Diagnosis not present

## 2014-01-18 LAB — CK-MB
CK-MB: 3 ng/mL (ref 0.5–3.6)
CK-MB: 2.5 ng/mL (ref 0.5–3.6)
CK-MB: 2.3 ng/mL (ref 0.5–3.6)

## 2014-01-18 LAB — HEPATIC FUNCTION PANEL A (ARMC)
ALT: 13 U/L (ref 12–78)
Albumin: 2.9 g/dL — ABNORMAL LOW (ref 3.4–5.0)
Alkaline Phosphatase: 110 U/L
BILIRUBIN TOTAL: 3.9 mg/dL — AB (ref 0.2–1.0)
Bilirubin, Direct: 0.5 mg/dL — ABNORMAL HIGH (ref 0.00–0.20)
SGOT(AST): 17 U/L (ref 15–37)
Total Protein: 6.5 g/dL (ref 6.4–8.2)

## 2014-01-18 LAB — CBC
HCT: 39.5 % — ABNORMAL LOW (ref 40.0–52.0)
HGB: 12.6 g/dL — ABNORMAL LOW (ref 13.0–18.0)
MCH: 38.1 pg — AB (ref 26.0–34.0)
MCHC: 31.9 g/dL — ABNORMAL LOW (ref 32.0–36.0)
MCV: 120 fL — AB (ref 80–100)
PLATELETS: 75 10*3/uL — AB (ref 150–440)
RBC: 3.3 10*6/uL — ABNORMAL LOW (ref 4.40–5.90)
RDW: 15.8 % — ABNORMAL HIGH (ref 11.5–14.5)
WBC: 5.7 10*3/uL (ref 3.8–10.6)

## 2014-01-18 LAB — CREATININE, SERUM
CREATININE: 2.85 mg/dL — AB (ref 0.60–1.30)
EGFR (African American): 25 — ABNORMAL LOW
EGFR (Non-African Amer.): 21 — ABNORMAL LOW

## 2014-01-18 LAB — BASIC METABOLIC PANEL
Anion Gap: 5 — ABNORMAL LOW (ref 7–16)
BUN: 58 mg/dL — ABNORMAL HIGH (ref 7–18)
Calcium, Total: 8.6 mg/dL (ref 8.5–10.1)
Chloride: 104 mmol/L (ref 98–107)
Co2: 28 mmol/L (ref 21–32)
Creatinine: 2.73 mg/dL — ABNORMAL HIGH (ref 0.60–1.30)
EGFR (African American): 26 — ABNORMAL LOW
EGFR (Non-African Amer.): 23 — ABNORMAL LOW
GLUCOSE: 153 mg/dL — AB (ref 65–99)
Osmolality: 293 (ref 275–301)
Potassium: 5 mmol/L (ref 3.5–5.1)
SODIUM: 137 mmol/L (ref 136–145)

## 2014-01-18 LAB — TROPONIN I
TROPONIN-I: 0.13 ng/mL — AB
Troponin-I: 0.13 ng/mL — ABNORMAL HIGH
Troponin-I: 0.12 ng/mL — ABNORMAL HIGH

## 2014-01-18 LAB — MAGNESIUM: Magnesium: 2.1 mg/dL

## 2014-01-18 LAB — POTASSIUM: POTASSIUM: 4.5 mmol/L (ref 3.5–5.1)

## 2014-01-18 LAB — PRO B NATRIURETIC PEPTIDE: B-Type Natriuretic Peptide: 17118 pg/mL — ABNORMAL HIGH (ref 0–125)

## 2014-01-18 LAB — PLATELET COUNT: Platelet: 61 10*3/uL — ABNORMAL LOW (ref 150–440)

## 2014-01-18 LAB — HEMOGLOBIN: HGB: 11.3 g/dL — ABNORMAL LOW (ref 13.0–18.0)

## 2014-01-19 LAB — CBC WITH DIFFERENTIAL/PLATELET
BASOS ABS: 0 10*3/uL (ref 0.0–0.1)
Basophil %: 0.5 %
EOS PCT: 2.4 %
Eosinophil #: 0.1 10*3/uL (ref 0.0–0.7)
HCT: 34.8 % — ABNORMAL LOW (ref 40.0–52.0)
HGB: 11.5 g/dL — ABNORMAL LOW (ref 13.0–18.0)
Lymphocyte #: 1.6 10*3/uL (ref 1.0–3.6)
Lymphocyte %: 28.4 %
MCH: 39.4 pg — AB (ref 26.0–34.0)
MCHC: 33.2 g/dL (ref 32.0–36.0)
MCV: 119 fL — ABNORMAL HIGH (ref 80–100)
Monocyte #: 0.6 x10 3/mm (ref 0.2–1.0)
Monocyte %: 11.2 %
NEUTROS ABS: 3.3 10*3/uL (ref 1.4–6.5)
NEUTROS PCT: 57.5 %
PLATELETS: 71 10*3/uL — AB (ref 150–440)
RBC: 2.93 10*6/uL — ABNORMAL LOW (ref 4.40–5.90)
RDW: 15.8 % — ABNORMAL HIGH (ref 11.5–14.5)
WBC: 5.7 10*3/uL (ref 3.8–10.6)

## 2014-01-19 LAB — COMPREHENSIVE METABOLIC PANEL
ALBUMIN: 2.8 g/dL — AB (ref 3.4–5.0)
ALK PHOS: 109 U/L
ALT: 14 U/L (ref 12–78)
Anion Gap: 5 — ABNORMAL LOW (ref 7–16)
BUN: 59 mg/dL — AB (ref 7–18)
Bilirubin,Total: 3.3 mg/dL — ABNORMAL HIGH (ref 0.2–1.0)
Calcium, Total: 8.5 mg/dL (ref 8.5–10.1)
Chloride: 103 mmol/L (ref 98–107)
Co2: 29 mmol/L (ref 21–32)
Creatinine: 2.85 mg/dL — ABNORMAL HIGH (ref 0.60–1.30)
EGFR (African American): 25 — ABNORMAL LOW
GFR CALC NON AF AMER: 21 — AB
Glucose: 114 mg/dL — ABNORMAL HIGH (ref 65–99)
Osmolality: 291 (ref 275–301)
Potassium: 4.6 mmol/L (ref 3.5–5.1)
SGOT(AST): 17 U/L (ref 15–37)
Sodium: 137 mmol/L (ref 136–145)
TOTAL PROTEIN: 6.4 g/dL (ref 6.4–8.2)

## 2014-01-20 LAB — BASIC METABOLIC PANEL
Anion Gap: 4 — ABNORMAL LOW (ref 7–16)
BUN: 56 mg/dL — ABNORMAL HIGH (ref 7–18)
CALCIUM: 8.7 mg/dL (ref 8.5–10.1)
Chloride: 102 mmol/L (ref 98–107)
Co2: 32 mmol/L (ref 21–32)
Creatinine: 2.54 mg/dL — ABNORMAL HIGH (ref 0.60–1.30)
EGFR (Non-African Amer.): 25 — ABNORMAL LOW
GFR CALC AF AMER: 29 — AB
Glucose: 132 mg/dL — ABNORMAL HIGH (ref 65–99)
Osmolality: 293 (ref 275–301)
Potassium: 4.4 mmol/L (ref 3.5–5.1)
Sodium: 138 mmol/L (ref 136–145)

## 2014-01-20 LAB — CBC WITH DIFFERENTIAL/PLATELET
Basophil #: 0 10*3/uL (ref 0.0–0.1)
Basophil %: 0.4 %
EOS PCT: 1.3 %
Eosinophil #: 0.1 10*3/uL (ref 0.0–0.7)
HCT: 36.1 % — ABNORMAL LOW (ref 40.0–52.0)
HGB: 11.9 g/dL — AB (ref 13.0–18.0)
Lymphocyte #: 1.2 10*3/uL (ref 1.0–3.6)
Lymphocyte %: 22.2 %
MCH: 39.3 pg — AB (ref 26.0–34.0)
MCHC: 33 g/dL (ref 32.0–36.0)
MCV: 119 fL — ABNORMAL HIGH (ref 80–100)
MONOS PCT: 11.7 %
Monocyte #: 0.6 x10 3/mm (ref 0.2–1.0)
Neutrophil #: 3.4 10*3/uL (ref 1.4–6.5)
Neutrophil %: 64.4 %
Platelet: 71 10*3/uL — ABNORMAL LOW (ref 150–440)
RBC: 3.03 10*6/uL — AB (ref 4.40–5.90)
RDW: 15.5 % — AB (ref 11.5–14.5)
WBC: 5.3 10*3/uL (ref 3.8–10.6)

## 2014-01-20 LAB — PROTIME-INR
INR: 1.4
PROTHROMBIN TIME: 16.9 s — AB (ref 11.5–14.7)

## 2014-01-20 LAB — TROPONIN I: Troponin-I: 0.07 ng/mL — ABNORMAL HIGH

## 2014-01-20 LAB — APTT
ACTIVATED PTT: 28.8 s (ref 23.6–35.9)
ACTIVATED PTT: 63.2 s — AB (ref 23.6–35.9)

## 2014-01-20 LAB — D-DIMER(ARMC): D-DIMER: 4482 ng/mL

## 2014-01-21 LAB — BASIC METABOLIC PANEL
Anion Gap: 5 — ABNORMAL LOW (ref 7–16)
BUN: 51 mg/dL — ABNORMAL HIGH (ref 7–18)
CALCIUM: 8.2 mg/dL — AB (ref 8.5–10.1)
Chloride: 103 mmol/L (ref 98–107)
Co2: 30 mmol/L (ref 21–32)
Creatinine: 2.25 mg/dL — ABNORMAL HIGH (ref 0.60–1.30)
EGFR (Non-African Amer.): 28 — ABNORMAL LOW
GFR CALC AF AMER: 33 — AB
Glucose: 138 mg/dL — ABNORMAL HIGH (ref 65–99)
Osmolality: 292 (ref 275–301)
Potassium: 4.5 mmol/L (ref 3.5–5.1)
Sodium: 138 mmol/L (ref 136–145)

## 2014-01-21 LAB — CBC WITH DIFFERENTIAL/PLATELET
BASOS PCT: 0.4 %
Basophil #: 0 10*3/uL (ref 0.0–0.1)
EOS ABS: 0.1 10*3/uL (ref 0.0–0.7)
EOS PCT: 1.9 %
HCT: 33.5 % — ABNORMAL LOW (ref 40.0–52.0)
HGB: 11.4 g/dL — ABNORMAL LOW (ref 13.0–18.0)
LYMPHS ABS: 1 10*3/uL (ref 1.0–3.6)
Lymphocyte %: 22.2 %
MCH: 39.7 pg — ABNORMAL HIGH (ref 26.0–34.0)
MCHC: 33.9 g/dL (ref 32.0–36.0)
MCV: 117 fL — AB (ref 80–100)
Monocyte #: 0.6 x10 3/mm (ref 0.2–1.0)
Monocyte %: 12.8 %
NEUTROS ABS: 2.9 10*3/uL (ref 1.4–6.5)
Neutrophil %: 62.7 %
Platelet: 70 10*3/uL — ABNORMAL LOW (ref 150–440)
RBC: 2.86 10*6/uL — AB (ref 4.40–5.90)
RDW: 15.4 % — ABNORMAL HIGH (ref 11.5–14.5)
WBC: 4.6 10*3/uL (ref 3.8–10.6)

## 2014-01-21 LAB — APTT: Activated PTT: 94.5 secs — ABNORMAL HIGH (ref 23.6–35.9)

## 2014-01-22 LAB — BASIC METABOLIC PANEL
ANION GAP: 5 — AB (ref 7–16)
BUN: 51 mg/dL — ABNORMAL HIGH (ref 7–18)
CALCIUM: 8.2 mg/dL — AB (ref 8.5–10.1)
Chloride: 102 mmol/L (ref 98–107)
Co2: 30 mmol/L (ref 21–32)
Creatinine: 2.5 mg/dL — ABNORMAL HIGH (ref 0.60–1.30)
EGFR (Non-African Amer.): 25 — ABNORMAL LOW
GFR CALC AF AMER: 29 — AB
GLUCOSE: 175 mg/dL — AB (ref 65–99)
OSMOLALITY: 292 (ref 275–301)
Potassium: 4.6 mmol/L (ref 3.5–5.1)
Sodium: 137 mmol/L (ref 136–145)

## 2014-01-22 LAB — CBC WITH DIFFERENTIAL/PLATELET
Basophil #: 0 10*3/uL (ref 0.0–0.1)
Basophil %: 0.5 %
EOS ABS: 0.1 10*3/uL (ref 0.0–0.7)
EOS PCT: 1.4 %
HCT: 32.9 % — AB (ref 40.0–52.0)
HGB: 11.1 g/dL — ABNORMAL LOW (ref 13.0–18.0)
LYMPHS ABS: 0.7 10*3/uL — AB (ref 1.0–3.6)
Lymphocyte %: 15.2 %
MCH: 39.7 pg — ABNORMAL HIGH (ref 26.0–34.0)
MCHC: 33.7 g/dL (ref 32.0–36.0)
MCV: 118 fL — ABNORMAL HIGH (ref 80–100)
MONO ABS: 0.6 x10 3/mm (ref 0.2–1.0)
Monocyte %: 12.3 %
NEUTROS PCT: 70.6 %
Neutrophil #: 3.4 10*3/uL (ref 1.4–6.5)
Platelet: 68 10*3/uL — ABNORMAL LOW (ref 150–440)
RBC: 2.79 10*6/uL — ABNORMAL LOW (ref 4.40–5.90)
RDW: 15.5 % — ABNORMAL HIGH (ref 11.5–14.5)
WBC: 4.7 10*3/uL (ref 3.8–10.6)

## 2014-01-22 LAB — PROTIME-INR
INR: 1.5
Prothrombin Time: 18 secs — ABNORMAL HIGH (ref 11.5–14.7)

## 2014-01-22 LAB — APTT: ACTIVATED PTT: 80.1 s — AB (ref 23.6–35.9)

## 2014-01-23 LAB — CBC WITH DIFFERENTIAL/PLATELET
BASOS PCT: 0.5 %
Basophil #: 0 10*3/uL (ref 0.0–0.1)
EOS ABS: 0.1 10*3/uL (ref 0.0–0.7)
EOS PCT: 2.6 %
HCT: 33.2 % — AB (ref 40.0–52.0)
HGB: 11.1 g/dL — ABNORMAL LOW (ref 13.0–18.0)
LYMPHS ABS: 0.9 10*3/uL — AB (ref 1.0–3.6)
LYMPHS PCT: 20.5 %
MCH: 39.9 pg — ABNORMAL HIGH (ref 26.0–34.0)
MCHC: 33.6 g/dL (ref 32.0–36.0)
MCV: 119 fL — ABNORMAL HIGH (ref 80–100)
MONO ABS: 0.5 x10 3/mm (ref 0.2–1.0)
Monocyte %: 12.3 %
NEUTROS ABS: 2.8 10*3/uL (ref 1.4–6.5)
NEUTROS PCT: 64.1 %
Platelet: 59 10*3/uL — ABNORMAL LOW (ref 150–440)
RBC: 2.79 10*6/uL — ABNORMAL LOW (ref 4.40–5.90)
RDW: 16 % — ABNORMAL HIGH (ref 11.5–14.5)
WBC: 4.4 10*3/uL (ref 3.8–10.6)

## 2014-01-23 LAB — BASIC METABOLIC PANEL
ANION GAP: 4 — AB (ref 7–16)
BUN: 53 mg/dL — ABNORMAL HIGH (ref 7–18)
CALCIUM: 8.3 mg/dL — AB (ref 8.5–10.1)
Chloride: 102 mmol/L (ref 98–107)
Co2: 29 mmol/L (ref 21–32)
Creatinine: 2.72 mg/dL — ABNORMAL HIGH (ref 0.60–1.30)
EGFR (African American): 26 — ABNORMAL LOW
EGFR (Non-African Amer.): 23 — ABNORMAL LOW
Glucose: 166 mg/dL — ABNORMAL HIGH (ref 65–99)
Osmolality: 288 (ref 275–301)
POTASSIUM: 4.8 mmol/L (ref 3.5–5.1)
SODIUM: 135 mmol/L — AB (ref 136–145)

## 2014-01-23 LAB — TROPONIN I
Troponin-I: <0.02 ng/mL
Troponin-I: <0.02 ng/mL

## 2014-01-23 LAB — PROTIME-INR
INR: 1.8
Prothrombin Time: 20.7 secs — ABNORMAL HIGH (ref 11.5–14.7)

## 2014-01-23 LAB — APTT
Activated PTT: 120.8 secs — ABNORMAL HIGH (ref 23.6–35.9)
Activated PTT: 108.9 secs — ABNORMAL HIGH (ref 23.6–35.9)

## 2014-01-24 LAB — CBC WITH DIFFERENTIAL/PLATELET
BASOS ABS: 0 10*3/uL (ref 0.0–0.1)
Basophil %: 0.5 %
EOS ABS: 0.1 10*3/uL (ref 0.0–0.7)
Eosinophil %: 2.1 %
HCT: 36.6 % — ABNORMAL LOW (ref 40.0–52.0)
HGB: 12.4 g/dL — ABNORMAL LOW (ref 13.0–18.0)
LYMPHS ABS: 1.3 10*3/uL (ref 1.0–3.6)
Lymphocyte %: 24 %
MCH: 40.4 pg — ABNORMAL HIGH (ref 26.0–34.0)
MCHC: 33.9 g/dL (ref 32.0–36.0)
MCV: 119 fL — ABNORMAL HIGH (ref 80–100)
MONO ABS: 0.6 x10 3/mm (ref 0.2–1.0)
MONOS PCT: 12 %
NEUTROS ABS: 3.2 10*3/uL (ref 1.4–6.5)
Neutrophil %: 61.4 %
Platelet: 61 10*3/uL — ABNORMAL LOW (ref 150–440)
RBC: 3.07 10*6/uL — AB (ref 4.40–5.90)
RDW: 15.8 % — AB (ref 11.5–14.5)
WBC: 5.2 10*3/uL (ref 3.8–10.6)

## 2014-01-24 LAB — BASIC METABOLIC PANEL
Anion Gap: 5 — ABNORMAL LOW (ref 7–16)
BUN: 62 mg/dL — ABNORMAL HIGH (ref 7–18)
CALCIUM: 8.4 mg/dL — AB (ref 8.5–10.1)
CHLORIDE: 100 mmol/L (ref 98–107)
CREATININE: 3.03 mg/dL — AB (ref 0.60–1.30)
Co2: 29 mmol/L (ref 21–32)
GFR CALC AF AMER: 23 — AB
GFR CALC NON AF AMER: 20 — AB
Glucose: 139 mg/dL — ABNORMAL HIGH (ref 65–99)
Osmolality: 288 (ref 275–301)
POTASSIUM: 4.8 mmol/L (ref 3.5–5.1)
SODIUM: 134 mmol/L — AB (ref 136–145)

## 2014-01-24 LAB — PROTIME-INR
INR: 2.1
PROTHROMBIN TIME: 23.4 s — AB (ref 11.5–14.7)

## 2014-01-24 LAB — APTT: Activated PTT: 85.2 secs — ABNORMAL HIGH (ref 23.6–35.9)

## 2014-01-24 LAB — TROPONIN I: Troponin-I: <0.02 ng/mL

## 2014-01-25 LAB — CBC WITH DIFFERENTIAL/PLATELET
Basophil #: 0 10*3/uL (ref 0.0–0.1)
Basophil %: 0.6 %
Eosinophil #: 0.1 10*3/uL (ref 0.0–0.7)
Eosinophil %: 2.6 %
HCT: 34.9 % — ABNORMAL LOW (ref 40.0–52.0)
HGB: 11.6 g/dL — ABNORMAL LOW (ref 13.0–18.0)
Lymphocyte #: 0.9 10*3/uL — ABNORMAL LOW (ref 1.0–3.6)
Lymphocyte %: 18.8 %
MCH: 39.6 pg — AB (ref 26.0–34.0)
MCHC: 33.3 g/dL (ref 32.0–36.0)
MCV: 119 fL — ABNORMAL HIGH (ref 80–100)
Monocyte #: 0.7 x10 3/mm (ref 0.2–1.0)
Monocyte %: 14 %
Neutrophil #: 3.1 10*3/uL (ref 1.4–6.5)
Neutrophil %: 64 %
Platelet: 57 10*3/uL — ABNORMAL LOW (ref 150–440)
RBC: 2.94 10*6/uL — AB (ref 4.40–5.90)
RDW: 15.7 % — AB (ref 11.5–14.5)
WBC: 4.8 10*3/uL (ref 3.8–10.6)

## 2014-01-25 LAB — BASIC METABOLIC PANEL
ANION GAP: 2 — AB (ref 7–16)
BUN: 66 mg/dL — ABNORMAL HIGH (ref 7–18)
CALCIUM: 8.4 mg/dL — AB (ref 8.5–10.1)
CHLORIDE: 100 mmol/L (ref 98–107)
Co2: 31 mmol/L (ref 21–32)
Creatinine: 3.18 mg/dL — ABNORMAL HIGH (ref 0.60–1.30)
EGFR (African American): 22 — ABNORMAL LOW
GFR CALC NON AF AMER: 19 — AB
GLUCOSE: 133 mg/dL — AB (ref 65–99)
OSMOLALITY: 287 (ref 275–301)
Potassium: 4.7 mmol/L (ref 3.5–5.1)
SODIUM: 133 mmol/L — AB (ref 136–145)

## 2014-01-25 LAB — PROTIME-INR
INR: 2.7
Prothrombin Time: 28.2 secs — ABNORMAL HIGH (ref 11.5–14.7)

## 2014-01-25 LAB — CULTURE, BLOOD (SINGLE)

## 2014-01-25 LAB — APTT: Activated PTT: 110.1 secs — ABNORMAL HIGH (ref 23.6–35.9)

## 2014-01-26 LAB — BASIC METABOLIC PANEL
Anion Gap: 4 — ABNORMAL LOW (ref 7–16)
BUN: 69 mg/dL — ABNORMAL HIGH (ref 7–18)
CALCIUM: 8.4 mg/dL — AB (ref 8.5–10.1)
CO2: 31 mmol/L (ref 21–32)
CREATININE: 3.27 mg/dL — AB (ref 0.60–1.30)
Chloride: 99 mmol/L (ref 98–107)
EGFR (African American): 21 — ABNORMAL LOW
EGFR (Non-African Amer.): 18 — ABNORMAL LOW
Glucose: 160 mg/dL — ABNORMAL HIGH (ref 65–99)
Osmolality: 292 (ref 275–301)
Potassium: 4.5 mmol/L (ref 3.5–5.1)
Sodium: 134 mmol/L — ABNORMAL LOW (ref 136–145)

## 2014-01-26 LAB — PROTIME-INR
INR: 2.4
Prothrombin Time: 25.3 secs — ABNORMAL HIGH (ref 11.5–14.7)

## 2014-01-27 LAB — CBC WITH DIFFERENTIAL/PLATELET
BASOS PCT: 0.5 %
Basophil #: 0 10*3/uL (ref 0.0–0.1)
EOS PCT: 3.1 %
Eosinophil #: 0.1 10*3/uL (ref 0.0–0.7)
HCT: 32.5 % — AB (ref 40.0–52.0)
HGB: 10.8 g/dL — ABNORMAL LOW (ref 13.0–18.0)
Lymphocyte #: 0.9 10*3/uL — ABNORMAL LOW (ref 1.0–3.6)
Lymphocyte %: 20.3 %
MCH: 39.3 pg — ABNORMAL HIGH (ref 26.0–34.0)
MCHC: 33.1 g/dL (ref 32.0–36.0)
MCV: 119 fL — ABNORMAL HIGH (ref 80–100)
MONO ABS: 0.5 x10 3/mm (ref 0.2–1.0)
Monocyte %: 10.3 %
NEUTROS ABS: 3 10*3/uL (ref 1.4–6.5)
Neutrophil %: 65.8 %
Platelet: 45 10*3/uL — ABNORMAL LOW (ref 150–440)
RBC: 2.74 10*6/uL — AB (ref 4.40–5.90)
RDW: 16.1 % — AB (ref 11.5–14.5)
WBC: 4.5 10*3/uL (ref 3.8–10.6)

## 2014-01-27 LAB — RENAL FUNCTION PANEL
ALBUMIN: 3 g/dL — AB (ref 3.4–5.0)
Anion Gap: 3 — ABNORMAL LOW (ref 7–16)
BUN: 67 mg/dL — ABNORMAL HIGH (ref 7–18)
CO2: 31 mmol/L (ref 21–32)
Calcium, Total: 8.4 mg/dL — ABNORMAL LOW (ref 8.5–10.1)
Chloride: 102 mmol/L (ref 98–107)
Creatinine: 2.96 mg/dL — ABNORMAL HIGH (ref 0.60–1.30)
EGFR (Non-African Amer.): 20 — ABNORMAL LOW
GFR CALC AF AMER: 24 — AB
Glucose: 153 mg/dL — ABNORMAL HIGH (ref 65–99)
Osmolality: 294 (ref 275–301)
Phosphorus: 4 mg/dL (ref 2.5–4.9)
Potassium: 4.5 mmol/L (ref 3.5–5.1)
Sodium: 136 mmol/L (ref 136–145)

## 2014-01-27 LAB — PROTIME-INR
INR: 2.2
PROTHROMBIN TIME: 23.7 s — AB (ref 11.5–14.7)

## 2014-01-27 LAB — MAGNESIUM: MAGNESIUM: 2 mg/dL

## 2014-01-28 LAB — CBC WITH DIFFERENTIAL/PLATELET
BASOS PCT: 0.4 %
Basophil #: 0 10*3/uL (ref 0.0–0.1)
EOS ABS: 0.1 10*3/uL (ref 0.0–0.7)
EOS PCT: 2 %
HCT: 33.9 % — ABNORMAL LOW (ref 40.0–52.0)
HGB: 11 g/dL — ABNORMAL LOW (ref 13.0–18.0)
Lymphocyte #: 1.1 10*3/uL (ref 1.0–3.6)
Lymphocyte %: 20.9 %
MCH: 38.3 pg — ABNORMAL HIGH (ref 26.0–34.0)
MCHC: 32.4 g/dL (ref 32.0–36.0)
MCV: 118 fL — AB (ref 80–100)
Monocyte #: 0.6 x10 3/mm (ref 0.2–1.0)
Monocyte %: 11.6 %
NEUTROS PCT: 65.1 %
Neutrophil #: 3.5 10*3/uL (ref 1.4–6.5)
PLATELETS: 47 10*3/uL — AB (ref 150–440)
RBC: 2.87 10*6/uL — AB (ref 4.40–5.90)
RDW: 15.9 % — ABNORMAL HIGH (ref 11.5–14.5)
WBC: 5.4 10*3/uL (ref 3.8–10.6)

## 2014-01-28 LAB — PROTIME-INR
INR: 2
Prothrombin Time: 22.2 secs — ABNORMAL HIGH (ref 11.5–14.7)

## 2014-01-28 LAB — BASIC METABOLIC PANEL
ANION GAP: 0 — AB (ref 7–16)
BUN: 65 mg/dL — ABNORMAL HIGH (ref 7–18)
CHLORIDE: 102 mmol/L (ref 98–107)
CO2: 32 mmol/L (ref 21–32)
Calcium, Total: 8.7 mg/dL (ref 8.5–10.1)
Creatinine: 2.66 mg/dL — ABNORMAL HIGH (ref 0.60–1.30)
EGFR (Non-African Amer.): 23 — ABNORMAL LOW
GFR CALC AF AMER: 27 — AB
Glucose: 156 mg/dL — ABNORMAL HIGH (ref 65–99)
Osmolality: 290 (ref 275–301)
Potassium: 4.6 mmol/L (ref 3.5–5.1)
SODIUM: 134 mmol/L — AB (ref 136–145)

## 2014-01-29 LAB — BASIC METABOLIC PANEL
Anion Gap: 4 — ABNORMAL LOW (ref 7–16)
BUN: 59 mg/dL — ABNORMAL HIGH (ref 7–18)
CO2: 34 mmol/L — AB (ref 21–32)
Calcium, Total: 8.9 mg/dL (ref 8.5–10.1)
Chloride: 97 mmol/L — ABNORMAL LOW (ref 98–107)
Creatinine: 2.31 mg/dL — ABNORMAL HIGH (ref 0.60–1.30)
EGFR (African American): 32 — ABNORMAL LOW
GFR CALC NON AF AMER: 28 — AB
Glucose: 141 mg/dL — ABNORMAL HIGH (ref 65–99)
OSMOLALITY: 289 (ref 275–301)
Potassium: 4.9 mmol/L (ref 3.5–5.1)
Sodium: 135 mmol/L — ABNORMAL LOW (ref 136–145)

## 2014-01-29 LAB — PROTIME-INR
INR: 2
Prothrombin Time: 22.1 secs — ABNORMAL HIGH (ref 11.5–14.7)

## 2014-01-29 LAB — HEMOGLOBIN: HGB: 11 g/dL — ABNORMAL LOW (ref 13.0–18.0)

## 2014-01-30 LAB — BASIC METABOLIC PANEL
Anion Gap: 3 — ABNORMAL LOW (ref 7–16)
BUN: 66 mg/dL — ABNORMAL HIGH (ref 7–18)
CALCIUM: 8.6 mg/dL (ref 8.5–10.1)
CREATININE: 2.44 mg/dL — AB (ref 0.60–1.30)
Chloride: 97 mmol/L — ABNORMAL LOW (ref 98–107)
Co2: 36 mmol/L — ABNORMAL HIGH (ref 21–32)
EGFR (African American): 30 — ABNORMAL LOW
GFR CALC NON AF AMER: 26 — AB
GLUCOSE: 186 mg/dL — AB (ref 65–99)
Osmolality: 296 (ref 275–301)
Potassium: 5 mmol/L (ref 3.5–5.1)
Sodium: 136 mmol/L (ref 136–145)

## 2014-01-30 LAB — PROTIME-INR
INR: 2.4
Prothrombin Time: 25.8 secs — ABNORMAL HIGH (ref 11.5–14.7)

## 2014-01-31 LAB — BASIC METABOLIC PANEL
Anion Gap: 2 — ABNORMAL LOW (ref 7–16)
BUN: 66 mg/dL — AB (ref 7–18)
CALCIUM: 9 mg/dL (ref 8.5–10.1)
CREATININE: 2.52 mg/dL — AB (ref 0.60–1.30)
Chloride: 97 mmol/L — ABNORMAL LOW (ref 98–107)
Co2: 37 mmol/L — ABNORMAL HIGH (ref 21–32)
EGFR (African American): 29 — ABNORMAL LOW
GFR CALC NON AF AMER: 25 — AB
Glucose: 145 mg/dL — ABNORMAL HIGH (ref 65–99)
OSMOLALITY: 294 (ref 275–301)
Potassium: 5.2 mmol/L — ABNORMAL HIGH (ref 3.5–5.1)
Sodium: 136 mmol/L (ref 136–145)

## 2014-01-31 LAB — HEMOGLOBIN A1C: HEMOGLOBIN A1C: 7.1 % — AB (ref 4.2–6.3)

## 2014-02-01 DIAGNOSIS — E119 Type 2 diabetes mellitus without complications: Secondary | ICD-10-CM | POA: Diagnosis not present

## 2014-02-01 DIAGNOSIS — N189 Chronic kidney disease, unspecified: Secondary | ICD-10-CM | POA: Diagnosis not present

## 2014-02-01 DIAGNOSIS — F4310 Post-traumatic stress disorder, unspecified: Secondary | ICD-10-CM | POA: Diagnosis not present

## 2014-02-01 DIAGNOSIS — B20 Human immunodeficiency virus [HIV] disease: Secondary | ICD-10-CM | POA: Diagnosis not present

## 2014-02-01 DIAGNOSIS — I4891 Unspecified atrial fibrillation: Secondary | ICD-10-CM | POA: Diagnosis not present

## 2014-02-01 DIAGNOSIS — Z9981 Dependence on supplemental oxygen: Secondary | ICD-10-CM | POA: Diagnosis not present

## 2014-02-01 DIAGNOSIS — I517 Cardiomegaly: Secondary | ICD-10-CM | POA: Diagnosis not present

## 2014-02-01 DIAGNOSIS — B1920 Unspecified viral hepatitis C without hepatic coma: Secondary | ICD-10-CM | POA: Diagnosis not present

## 2014-02-01 DIAGNOSIS — Z7901 Long term (current) use of anticoagulants: Secondary | ICD-10-CM | POA: Diagnosis not present

## 2014-02-01 DIAGNOSIS — I509 Heart failure, unspecified: Secondary | ICD-10-CM | POA: Diagnosis not present

## 2014-02-01 DIAGNOSIS — I82409 Acute embolism and thrombosis of unspecified deep veins of unspecified lower extremity: Secondary | ICD-10-CM | POA: Diagnosis not present

## 2014-02-02 DIAGNOSIS — B20 Human immunodeficiency virus [HIV] disease: Secondary | ICD-10-CM | POA: Diagnosis not present

## 2014-02-02 DIAGNOSIS — R7401 Elevation of levels of liver transaminase levels: Secondary | ICD-10-CM | POA: Diagnosis not present

## 2014-02-02 DIAGNOSIS — Z79899 Other long term (current) drug therapy: Secondary | ICD-10-CM | POA: Diagnosis not present

## 2014-02-06 ENCOUNTER — Ambulatory Visit: Payer: Self-pay | Admitting: Oncology

## 2014-02-06 DIAGNOSIS — D631 Anemia in chronic kidney disease: Secondary | ICD-10-CM | POA: Diagnosis not present

## 2014-02-06 DIAGNOSIS — N2581 Secondary hyperparathyroidism of renal origin: Secondary | ICD-10-CM | POA: Diagnosis not present

## 2014-02-06 DIAGNOSIS — I509 Heart failure, unspecified: Secondary | ICD-10-CM | POA: Diagnosis not present

## 2014-02-06 DIAGNOSIS — N184 Chronic kidney disease, stage 4 (severe): Secondary | ICD-10-CM | POA: Diagnosis not present

## 2014-02-06 DIAGNOSIS — I5022 Chronic systolic (congestive) heart failure: Secondary | ICD-10-CM | POA: Diagnosis not present

## 2014-02-07 DIAGNOSIS — I509 Heart failure, unspecified: Secondary | ICD-10-CM | POA: Diagnosis not present

## 2014-02-07 DIAGNOSIS — B20 Human immunodeficiency virus [HIV] disease: Secondary | ICD-10-CM | POA: Diagnosis not present

## 2014-02-07 DIAGNOSIS — B1920 Unspecified viral hepatitis C without hepatic coma: Secondary | ICD-10-CM | POA: Diagnosis not present

## 2014-02-07 DIAGNOSIS — I82409 Acute embolism and thrombosis of unspecified deep veins of unspecified lower extremity: Secondary | ICD-10-CM | POA: Diagnosis not present

## 2014-02-07 DIAGNOSIS — E119 Type 2 diabetes mellitus without complications: Secondary | ICD-10-CM | POA: Diagnosis not present

## 2014-02-07 DIAGNOSIS — N189 Chronic kidney disease, unspecified: Secondary | ICD-10-CM | POA: Diagnosis not present

## 2014-02-09 DIAGNOSIS — I519 Heart disease, unspecified: Secondary | ICD-10-CM | POA: Diagnosis not present

## 2014-02-09 DIAGNOSIS — I2510 Atherosclerotic heart disease of native coronary artery without angina pectoris: Secondary | ICD-10-CM | POA: Diagnosis not present

## 2014-02-09 DIAGNOSIS — R0609 Other forms of dyspnea: Secondary | ICD-10-CM | POA: Diagnosis not present

## 2014-02-09 DIAGNOSIS — I5023 Acute on chronic systolic (congestive) heart failure: Secondary | ICD-10-CM | POA: Diagnosis not present

## 2014-02-10 DIAGNOSIS — N189 Chronic kidney disease, unspecified: Secondary | ICD-10-CM | POA: Diagnosis not present

## 2014-02-10 DIAGNOSIS — I82409 Acute embolism and thrombosis of unspecified deep veins of unspecified lower extremity: Secondary | ICD-10-CM | POA: Diagnosis not present

## 2014-02-10 DIAGNOSIS — I509 Heart failure, unspecified: Secondary | ICD-10-CM | POA: Diagnosis not present

## 2014-02-10 DIAGNOSIS — E119 Type 2 diabetes mellitus without complications: Secondary | ICD-10-CM | POA: Diagnosis not present

## 2014-02-10 DIAGNOSIS — B1920 Unspecified viral hepatitis C without hepatic coma: Secondary | ICD-10-CM | POA: Diagnosis not present

## 2014-02-10 DIAGNOSIS — B20 Human immunodeficiency virus [HIV] disease: Secondary | ICD-10-CM | POA: Diagnosis not present

## 2014-02-14 DIAGNOSIS — N189 Chronic kidney disease, unspecified: Secondary | ICD-10-CM | POA: Diagnosis not present

## 2014-02-14 DIAGNOSIS — E119 Type 2 diabetes mellitus without complications: Secondary | ICD-10-CM | POA: Diagnosis not present

## 2014-02-14 DIAGNOSIS — B182 Chronic viral hepatitis C: Secondary | ICD-10-CM | POA: Diagnosis not present

## 2014-02-14 DIAGNOSIS — B1920 Unspecified viral hepatitis C without hepatic coma: Secondary | ICD-10-CM | POA: Diagnosis not present

## 2014-02-14 DIAGNOSIS — B20 Human immunodeficiency virus [HIV] disease: Secondary | ICD-10-CM | POA: Diagnosis not present

## 2014-02-14 DIAGNOSIS — I509 Heart failure, unspecified: Secondary | ICD-10-CM | POA: Diagnosis not present

## 2014-02-14 DIAGNOSIS — I82409 Acute embolism and thrombosis of unspecified deep veins of unspecified lower extremity: Secondary | ICD-10-CM | POA: Diagnosis not present

## 2014-02-17 ENCOUNTER — Ambulatory Visit: Payer: Self-pay | Admitting: Oncology

## 2014-02-18 DIAGNOSIS — Z21 Asymptomatic human immunodeficiency virus [HIV] infection status: Secondary | ICD-10-CM | POA: Diagnosis present

## 2014-02-18 DIAGNOSIS — I2510 Atherosclerotic heart disease of native coronary artery without angina pectoris: Secondary | ICD-10-CM | POA: Diagnosis present

## 2014-02-18 DIAGNOSIS — N189 Chronic kidney disease, unspecified: Secondary | ICD-10-CM | POA: Diagnosis not present

## 2014-02-18 DIAGNOSIS — L97809 Non-pressure chronic ulcer of other part of unspecified lower leg with unspecified severity: Secondary | ICD-10-CM | POA: Diagnosis present

## 2014-02-18 DIAGNOSIS — I447 Left bundle-branch block, unspecified: Secondary | ICD-10-CM | POA: Diagnosis not present

## 2014-02-18 DIAGNOSIS — R079 Chest pain, unspecified: Secondary | ICD-10-CM | POA: Diagnosis not present

## 2014-02-18 DIAGNOSIS — I129 Hypertensive chronic kidney disease with stage 1 through stage 4 chronic kidney disease, or unspecified chronic kidney disease: Secondary | ICD-10-CM | POA: Diagnosis present

## 2014-02-18 DIAGNOSIS — Z86718 Personal history of other venous thrombosis and embolism: Secondary | ICD-10-CM | POA: Diagnosis not present

## 2014-02-18 DIAGNOSIS — Z7901 Long term (current) use of anticoagulants: Secondary | ICD-10-CM | POA: Diagnosis not present

## 2014-02-18 DIAGNOSIS — D539 Nutritional anemia, unspecified: Secondary | ICD-10-CM | POA: Diagnosis not present

## 2014-02-18 DIAGNOSIS — B20 Human immunodeficiency virus [HIV] disease: Secondary | ICD-10-CM | POA: Diagnosis not present

## 2014-02-18 DIAGNOSIS — B1920 Unspecified viral hepatitis C without hepatic coma: Secondary | ICD-10-CM | POA: Diagnosis present

## 2014-02-18 DIAGNOSIS — J90 Pleural effusion, not elsewhere classified: Secondary | ICD-10-CM | POA: Diagnosis not present

## 2014-02-18 DIAGNOSIS — Z9861 Coronary angioplasty status: Secondary | ICD-10-CM | POA: Diagnosis not present

## 2014-02-18 DIAGNOSIS — I5022 Chronic systolic (congestive) heart failure: Secondary | ICD-10-CM | POA: Diagnosis not present

## 2014-02-18 DIAGNOSIS — F4310 Post-traumatic stress disorder, unspecified: Secondary | ICD-10-CM | POA: Diagnosis present

## 2014-02-18 DIAGNOSIS — I369 Nonrheumatic tricuspid valve disorder, unspecified: Secondary | ICD-10-CM | POA: Diagnosis not present

## 2014-02-18 DIAGNOSIS — E875 Hyperkalemia: Secondary | ICD-10-CM | POA: Diagnosis not present

## 2014-02-18 DIAGNOSIS — R4182 Altered mental status, unspecified: Secondary | ICD-10-CM | POA: Diagnosis not present

## 2014-02-18 DIAGNOSIS — I4729 Other ventricular tachycardia: Secondary | ICD-10-CM | POA: Diagnosis present

## 2014-02-18 DIAGNOSIS — I2589 Other forms of chronic ischemic heart disease: Secondary | ICD-10-CM | POA: Diagnosis not present

## 2014-02-18 DIAGNOSIS — R110 Nausea: Secondary | ICD-10-CM | POA: Diagnosis not present

## 2014-02-18 DIAGNOSIS — J312 Chronic pharyngitis: Secondary | ICD-10-CM | POA: Diagnosis not present

## 2014-02-18 DIAGNOSIS — I472 Ventricular tachycardia, unspecified: Secondary | ICD-10-CM | POA: Diagnosis not present

## 2014-02-18 DIAGNOSIS — I517 Cardiomegaly: Secondary | ICD-10-CM | POA: Diagnosis not present

## 2014-02-18 DIAGNOSIS — Z006 Encounter for examination for normal comparison and control in clinical research program: Secondary | ICD-10-CM | POA: Diagnosis not present

## 2014-02-18 DIAGNOSIS — I4891 Unspecified atrial fibrillation: Secondary | ICD-10-CM | POA: Diagnosis not present

## 2014-02-18 DIAGNOSIS — I498 Other specified cardiac arrhythmias: Secondary | ICD-10-CM | POA: Diagnosis not present

## 2014-02-18 DIAGNOSIS — I5020 Unspecified systolic (congestive) heart failure: Secondary | ICD-10-CM | POA: Diagnosis not present

## 2014-02-18 DIAGNOSIS — D638 Anemia in other chronic diseases classified elsewhere: Secondary | ICD-10-CM | POA: Diagnosis present

## 2014-02-18 DIAGNOSIS — R0789 Other chest pain: Secondary | ICD-10-CM | POA: Diagnosis not present

## 2014-02-18 DIAGNOSIS — R000 Tachycardia, unspecified: Secondary | ICD-10-CM | POA: Diagnosis not present

## 2014-02-18 DIAGNOSIS — I455 Other specified heart block: Secondary | ICD-10-CM | POA: Diagnosis not present

## 2014-02-18 DIAGNOSIS — N179 Acute kidney failure, unspecified: Secondary | ICD-10-CM | POA: Diagnosis not present

## 2014-02-18 DIAGNOSIS — R17 Unspecified jaundice: Secondary | ICD-10-CM | POA: Diagnosis not present

## 2014-02-18 DIAGNOSIS — Z8673 Personal history of transient ischemic attack (TIA), and cerebral infarction without residual deficits: Secondary | ICD-10-CM | POA: Diagnosis not present

## 2014-02-18 DIAGNOSIS — E119 Type 2 diabetes mellitus without complications: Secondary | ICD-10-CM | POA: Diagnosis not present

## 2014-02-18 DIAGNOSIS — I499 Cardiac arrhythmia, unspecified: Secondary | ICD-10-CM | POA: Diagnosis not present

## 2014-02-18 DIAGNOSIS — I509 Heart failure, unspecified: Secondary | ICD-10-CM | POA: Diagnosis not present

## 2014-02-18 DIAGNOSIS — I5021 Acute systolic (congestive) heart failure: Secondary | ICD-10-CM | POA: Diagnosis not present

## 2014-03-02 DIAGNOSIS — N183 Chronic kidney disease, stage 3 unspecified: Secondary | ICD-10-CM | POA: Diagnosis not present

## 2014-03-02 DIAGNOSIS — I509 Heart failure, unspecified: Secondary | ICD-10-CM | POA: Diagnosis not present

## 2014-03-02 DIAGNOSIS — D509 Iron deficiency anemia, unspecified: Secondary | ICD-10-CM | POA: Diagnosis not present

## 2014-03-02 DIAGNOSIS — J90 Pleural effusion, not elsewhere classified: Secondary | ICD-10-CM | POA: Diagnosis not present

## 2014-03-07 DIAGNOSIS — Z45018 Encounter for adjustment and management of other part of cardiac pacemaker: Secondary | ICD-10-CM | POA: Diagnosis not present

## 2014-03-07 DIAGNOSIS — Z95810 Presence of automatic (implantable) cardiac defibrillator: Secondary | ICD-10-CM | POA: Diagnosis not present

## 2014-03-07 DIAGNOSIS — I472 Ventricular tachycardia: Secondary | ICD-10-CM | POA: Diagnosis not present

## 2014-03-07 DIAGNOSIS — I4729 Other ventricular tachycardia: Secondary | ICD-10-CM | POA: Diagnosis not present

## 2014-03-10 DIAGNOSIS — N039 Chronic nephritic syndrome with unspecified morphologic changes: Secondary | ICD-10-CM | POA: Diagnosis not present

## 2014-03-10 DIAGNOSIS — D631 Anemia in chronic kidney disease: Secondary | ICD-10-CM | POA: Diagnosis not present

## 2014-03-10 DIAGNOSIS — R809 Proteinuria, unspecified: Secondary | ICD-10-CM | POA: Diagnosis not present

## 2014-03-10 DIAGNOSIS — N2581 Secondary hyperparathyroidism of renal origin: Secondary | ICD-10-CM | POA: Diagnosis not present

## 2014-03-10 DIAGNOSIS — N184 Chronic kidney disease, stage 4 (severe): Secondary | ICD-10-CM | POA: Diagnosis not present

## 2014-03-14 DIAGNOSIS — I87339 Chronic venous hypertension (idiopathic) with ulcer and inflammation of unspecified lower extremity: Secondary | ICD-10-CM | POA: Diagnosis not present

## 2014-03-14 DIAGNOSIS — L97909 Non-pressure chronic ulcer of unspecified part of unspecified lower leg with unspecified severity: Secondary | ICD-10-CM | POA: Diagnosis not present

## 2014-03-14 DIAGNOSIS — A4902 Methicillin resistant Staphylococcus aureus infection, unspecified site: Secondary | ICD-10-CM | POA: Diagnosis not present

## 2014-03-14 DIAGNOSIS — I87319 Chronic venous hypertension (idiopathic) with ulcer of unspecified lower extremity: Secondary | ICD-10-CM | POA: Diagnosis not present

## 2014-03-14 DIAGNOSIS — E119 Type 2 diabetes mellitus without complications: Secondary | ICD-10-CM | POA: Diagnosis not present

## 2014-03-14 DIAGNOSIS — R609 Edema, unspecified: Secondary | ICD-10-CM | POA: Diagnosis not present

## 2014-03-14 DIAGNOSIS — L97209 Non-pressure chronic ulcer of unspecified calf with unspecified severity: Secondary | ICD-10-CM | POA: Diagnosis not present

## 2014-03-14 DIAGNOSIS — N189 Chronic kidney disease, unspecified: Secondary | ICD-10-CM | POA: Diagnosis not present

## 2014-03-17 DIAGNOSIS — D509 Iron deficiency anemia, unspecified: Secondary | ICD-10-CM | POA: Diagnosis not present

## 2014-03-17 DIAGNOSIS — I469 Cardiac arrest, cause unspecified: Secondary | ICD-10-CM | POA: Diagnosis not present

## 2014-03-17 DIAGNOSIS — I4891 Unspecified atrial fibrillation: Secondary | ICD-10-CM | POA: Diagnosis not present

## 2014-03-17 DIAGNOSIS — Z7901 Long term (current) use of anticoagulants: Secondary | ICD-10-CM | POA: Diagnosis not present

## 2014-03-17 DIAGNOSIS — I509 Heart failure, unspecified: Secondary | ICD-10-CM | POA: Diagnosis not present

## 2014-03-21 ENCOUNTER — Ambulatory Visit: Payer: Self-pay | Admitting: Oncology

## 2014-03-23 DIAGNOSIS — I2589 Other forms of chronic ischemic heart disease: Secondary | ICD-10-CM | POA: Diagnosis not present

## 2014-03-23 DIAGNOSIS — R609 Edema, unspecified: Secondary | ICD-10-CM | POA: Diagnosis not present

## 2014-03-23 DIAGNOSIS — D631 Anemia in chronic kidney disease: Secondary | ICD-10-CM | POA: Diagnosis not present

## 2014-03-23 DIAGNOSIS — I2510 Atherosclerotic heart disease of native coronary artery without angina pectoris: Secondary | ICD-10-CM | POA: Diagnosis not present

## 2014-03-23 DIAGNOSIS — R809 Proteinuria, unspecified: Secondary | ICD-10-CM | POA: Diagnosis not present

## 2014-03-23 DIAGNOSIS — N183 Chronic kidney disease, stage 3 unspecified: Secondary | ICD-10-CM | POA: Diagnosis not present

## 2014-03-23 DIAGNOSIS — I5022 Chronic systolic (congestive) heart failure: Secondary | ICD-10-CM | POA: Diagnosis not present

## 2014-03-23 DIAGNOSIS — I10 Essential (primary) hypertension: Secondary | ICD-10-CM | POA: Diagnosis not present

## 2014-03-24 ENCOUNTER — Ambulatory Visit: Payer: Self-pay | Admitting: Oncology

## 2014-03-24 DIAGNOSIS — Z7901 Long term (current) use of anticoagulants: Secondary | ICD-10-CM | POA: Diagnosis not present

## 2014-03-24 DIAGNOSIS — B1920 Unspecified viral hepatitis C without hepatic coma: Secondary | ICD-10-CM | POA: Diagnosis not present

## 2014-03-24 DIAGNOSIS — I509 Heart failure, unspecified: Secondary | ICD-10-CM | POA: Diagnosis not present

## 2014-03-24 DIAGNOSIS — Z95818 Presence of other cardiac implants and grafts: Secondary | ICD-10-CM | POA: Diagnosis not present

## 2014-03-24 DIAGNOSIS — Z87891 Personal history of nicotine dependence: Secondary | ICD-10-CM | POA: Diagnosis not present

## 2014-03-24 DIAGNOSIS — I4891 Unspecified atrial fibrillation: Secondary | ICD-10-CM | POA: Diagnosis not present

## 2014-03-24 DIAGNOSIS — Z9089 Acquired absence of other organs: Secondary | ICD-10-CM | POA: Diagnosis not present

## 2014-03-24 DIAGNOSIS — Z86718 Personal history of other venous thrombosis and embolism: Secondary | ICD-10-CM | POA: Diagnosis not present

## 2014-03-24 DIAGNOSIS — D631 Anemia in chronic kidney disease: Secondary | ICD-10-CM | POA: Diagnosis not present

## 2014-03-24 DIAGNOSIS — D696 Thrombocytopenia, unspecified: Secondary | ICD-10-CM | POA: Diagnosis not present

## 2014-03-24 DIAGNOSIS — N039 Chronic nephritic syndrome with unspecified morphologic changes: Secondary | ICD-10-CM | POA: Diagnosis not present

## 2014-03-24 DIAGNOSIS — I2589 Other forms of chronic ischemic heart disease: Secondary | ICD-10-CM | POA: Diagnosis not present

## 2014-03-24 DIAGNOSIS — Z79899 Other long term (current) drug therapy: Secondary | ICD-10-CM | POA: Diagnosis not present

## 2014-03-24 DIAGNOSIS — B20 Human immunodeficiency virus [HIV] disease: Secondary | ICD-10-CM | POA: Diagnosis not present

## 2014-03-24 DIAGNOSIS — N189 Chronic kidney disease, unspecified: Secondary | ICD-10-CM | POA: Diagnosis not present

## 2014-03-24 DIAGNOSIS — I2510 Atherosclerotic heart disease of native coronary artery without angina pectoris: Secondary | ICD-10-CM | POA: Diagnosis not present

## 2014-03-24 LAB — CBC CANCER CENTER
BASOS PCT: 0.8 %
Basophil #: 0 x10 3/mm (ref 0.0–0.1)
EOS ABS: 0.2 x10 3/mm (ref 0.0–0.7)
Eosinophil %: 2.9 %
HCT: 28.2 % — AB (ref 40.0–52.0)
HGB: 9.5 g/dL — ABNORMAL LOW (ref 13.0–18.0)
Lymphocyte #: 1.4 x10 3/mm (ref 1.0–3.6)
Lymphocyte %: 25.4 %
MCH: 41.9 pg — ABNORMAL HIGH (ref 26.0–34.0)
MCHC: 33.6 g/dL (ref 32.0–36.0)
MCV: 125 fL — AB (ref 80–100)
Monocyte #: 0.5 x10 3/mm (ref 0.2–1.0)
Monocyte %: 9 %
NEUTROS PCT: 61.9 %
Neutrophil #: 3.3 x10 3/mm (ref 1.4–6.5)
PLATELETS: 83 x10 3/mm — AB (ref 150–440)
RBC: 2.26 10*6/uL — AB (ref 4.40–5.90)
RDW: 15.3 % — ABNORMAL HIGH (ref 11.5–14.5)
WBC: 5.3 x10 3/mm (ref 3.8–10.6)

## 2014-03-24 LAB — IRON AND TIBC
IRON: 97 ug/dL (ref 65–175)
Iron Bind.Cap.(Total): 335 ug/dL (ref 250–450)
Iron Saturation: 29 %
Unbound Iron-Bind.Cap.: 238 ug/dL

## 2014-03-24 LAB — FOLATE: Folic Acid: 14.5 ng/mL (ref 3.1–100.0)

## 2014-03-24 LAB — FERRITIN: FERRITIN (ARMC): 147 ng/mL (ref 8–388)

## 2014-03-24 LAB — LACTATE DEHYDROGENASE: LDH: 273 U/L — ABNORMAL HIGH (ref 85–241)

## 2014-04-04 DIAGNOSIS — R809 Proteinuria, unspecified: Secondary | ICD-10-CM | POA: Diagnosis not present

## 2014-04-04 DIAGNOSIS — N039 Chronic nephritic syndrome with unspecified morphologic changes: Secondary | ICD-10-CM | POA: Diagnosis not present

## 2014-04-04 DIAGNOSIS — B20 Human immunodeficiency virus [HIV] disease: Secondary | ICD-10-CM | POA: Diagnosis not present

## 2014-04-04 DIAGNOSIS — D696 Thrombocytopenia, unspecified: Secondary | ICD-10-CM | POA: Diagnosis not present

## 2014-04-04 DIAGNOSIS — D631 Anemia in chronic kidney disease: Secondary | ICD-10-CM | POA: Diagnosis not present

## 2014-04-04 DIAGNOSIS — N189 Chronic kidney disease, unspecified: Secondary | ICD-10-CM | POA: Diagnosis not present

## 2014-04-04 DIAGNOSIS — B1920 Unspecified viral hepatitis C without hepatic coma: Secondary | ICD-10-CM | POA: Diagnosis not present

## 2014-04-04 DIAGNOSIS — R609 Edema, unspecified: Secondary | ICD-10-CM | POA: Diagnosis not present

## 2014-04-04 DIAGNOSIS — N183 Chronic kidney disease, stage 3 unspecified: Secondary | ICD-10-CM | POA: Diagnosis not present

## 2014-04-04 DIAGNOSIS — Z86718 Personal history of other venous thrombosis and embolism: Secondary | ICD-10-CM | POA: Diagnosis not present

## 2014-04-04 LAB — CANCER CENTER HEMOGLOBIN: HGB: 9.5 g/dL — ABNORMAL LOW (ref 13.0–18.0)

## 2014-04-09 ENCOUNTER — Observation Stay: Payer: Self-pay | Admitting: Specialist

## 2014-04-09 DIAGNOSIS — Z7901 Long term (current) use of anticoagulants: Secondary | ICD-10-CM | POA: Diagnosis not present

## 2014-04-09 DIAGNOSIS — K219 Gastro-esophageal reflux disease without esophagitis: Secondary | ICD-10-CM | POA: Diagnosis not present

## 2014-04-09 DIAGNOSIS — I2510 Atherosclerotic heart disease of native coronary artery without angina pectoris: Secondary | ICD-10-CM | POA: Diagnosis not present

## 2014-04-09 DIAGNOSIS — N179 Acute kidney failure, unspecified: Secondary | ICD-10-CM | POA: Diagnosis not present

## 2014-04-09 DIAGNOSIS — I509 Heart failure, unspecified: Secondary | ICD-10-CM | POA: Diagnosis not present

## 2014-04-09 DIAGNOSIS — Z21 Asymptomatic human immunodeficiency virus [HIV] infection status: Secondary | ICD-10-CM | POA: Diagnosis not present

## 2014-04-09 DIAGNOSIS — Z9861 Coronary angioplasty status: Secondary | ICD-10-CM | POA: Diagnosis not present

## 2014-04-09 DIAGNOSIS — Z79899 Other long term (current) drug therapy: Secondary | ICD-10-CM | POA: Diagnosis not present

## 2014-04-09 DIAGNOSIS — E869 Volume depletion, unspecified: Secondary | ICD-10-CM | POA: Diagnosis not present

## 2014-04-09 DIAGNOSIS — N183 Chronic kidney disease, stage 3 unspecified: Secondary | ICD-10-CM | POA: Diagnosis not present

## 2014-04-09 DIAGNOSIS — R52 Pain, unspecified: Secondary | ICD-10-CM | POA: Diagnosis not present

## 2014-04-09 DIAGNOSIS — R0789 Other chest pain: Secondary | ICD-10-CM | POA: Diagnosis not present

## 2014-04-09 DIAGNOSIS — Z86718 Personal history of other venous thrombosis and embolism: Secondary | ICD-10-CM | POA: Diagnosis not present

## 2014-04-09 DIAGNOSIS — E860 Dehydration: Secondary | ICD-10-CM | POA: Diagnosis not present

## 2014-04-09 DIAGNOSIS — R079 Chest pain, unspecified: Secondary | ICD-10-CM | POA: Diagnosis not present

## 2014-04-09 DIAGNOSIS — I4891 Unspecified atrial fibrillation: Secondary | ICD-10-CM | POA: Diagnosis not present

## 2014-04-09 DIAGNOSIS — B1920 Unspecified viral hepatitis C without hepatic coma: Secondary | ICD-10-CM | POA: Diagnosis not present

## 2014-04-09 DIAGNOSIS — Z95810 Presence of automatic (implantable) cardiac defibrillator: Secondary | ICD-10-CM | POA: Diagnosis not present

## 2014-04-09 DIAGNOSIS — I2589 Other forms of chronic ischemic heart disease: Secondary | ICD-10-CM | POA: Diagnosis not present

## 2014-04-09 DIAGNOSIS — E119 Type 2 diabetes mellitus without complications: Secondary | ICD-10-CM | POA: Diagnosis not present

## 2014-04-09 DIAGNOSIS — F411 Generalized anxiety disorder: Secondary | ICD-10-CM | POA: Diagnosis not present

## 2014-04-09 DIAGNOSIS — J90 Pleural effusion, not elsewhere classified: Secondary | ICD-10-CM | POA: Diagnosis not present

## 2014-04-09 LAB — CBC
HCT: 27.5 % — ABNORMAL LOW (ref 40.0–52.0)
HGB: 9.3 g/dL — ABNORMAL LOW (ref 13.0–18.0)
MCH: 42.1 pg — ABNORMAL HIGH (ref 26.0–34.0)
MCHC: 33.7 g/dL (ref 32.0–36.0)
MCV: 125 fL — ABNORMAL HIGH (ref 80–100)
PLATELETS: 82 10*3/uL — AB (ref 150–440)
RBC: 2.2 10*6/uL — AB (ref 4.40–5.90)
RDW: 14.8 % — ABNORMAL HIGH (ref 11.5–14.5)
WBC: 4.8 10*3/uL (ref 3.8–10.6)

## 2014-04-09 LAB — CK TOTAL AND CKMB (NOT AT ARMC)
CK, Total: 45 U/L
CK, Total: 28 U/L — ABNORMAL LOW
CK, Total: 23 U/L — ABNORMAL LOW
CK-MB: 3.6 ng/mL (ref 0.5–3.6)
CK-MB: 2.3 ng/mL (ref 0.5–3.6)
CK-MB: 2.8 ng/mL (ref 0.5–3.6)

## 2014-04-09 LAB — PROTIME-INR
INR: 1.4
PROTHROMBIN TIME: 16.6 s — AB (ref 11.5–14.7)

## 2014-04-09 LAB — BASIC METABOLIC PANEL
Anion Gap: 7 (ref 7–16)
BUN: 56 mg/dL — AB (ref 7–18)
CALCIUM: 8.4 mg/dL — AB (ref 8.5–10.1)
CO2: 27 mmol/L (ref 21–32)
Chloride: 99 mmol/L (ref 98–107)
Creatinine: 2.91 mg/dL — ABNORMAL HIGH (ref 0.60–1.30)
EGFR (African American): 24 — ABNORMAL LOW
EGFR (Non-African Amer.): 21 — ABNORMAL LOW
Glucose: 183 mg/dL — ABNORMAL HIGH (ref 65–99)
OSMOLALITY: 287 (ref 275–301)
POTASSIUM: 5 mmol/L (ref 3.5–5.1)
Sodium: 133 mmol/L — ABNORMAL LOW (ref 136–145)

## 2014-04-09 LAB — TROPONIN I: Troponin-I: <0.02 ng/mL

## 2014-04-09 LAB — PRO B NATRIURETIC PEPTIDE: B-TYPE NATIURETIC PEPTID: 7739 pg/mL — AB (ref 0–125)

## 2014-04-18 DIAGNOSIS — A4902 Methicillin resistant Staphylococcus aureus infection, unspecified site: Secondary | ICD-10-CM | POA: Diagnosis not present

## 2014-04-18 DIAGNOSIS — I87319 Chronic venous hypertension (idiopathic) with ulcer of unspecified lower extremity: Secondary | ICD-10-CM | POA: Diagnosis not present

## 2014-04-18 DIAGNOSIS — I87339 Chronic venous hypertension (idiopathic) with ulcer and inflammation of unspecified lower extremity: Secondary | ICD-10-CM | POA: Diagnosis not present

## 2014-04-18 DIAGNOSIS — L97909 Non-pressure chronic ulcer of unspecified part of unspecified lower leg with unspecified severity: Secondary | ICD-10-CM | POA: Diagnosis not present

## 2014-04-18 DIAGNOSIS — N189 Chronic kidney disease, unspecified: Secondary | ICD-10-CM | POA: Diagnosis not present

## 2014-04-18 DIAGNOSIS — N184 Chronic kidney disease, stage 4 (severe): Secondary | ICD-10-CM | POA: Diagnosis not present

## 2014-04-18 DIAGNOSIS — R809 Proteinuria, unspecified: Secondary | ICD-10-CM | POA: Diagnosis not present

## 2014-04-18 DIAGNOSIS — R609 Edema, unspecified: Secondary | ICD-10-CM | POA: Diagnosis not present

## 2014-04-18 DIAGNOSIS — L97209 Non-pressure chronic ulcer of unspecified calf with unspecified severity: Secondary | ICD-10-CM | POA: Diagnosis not present

## 2014-04-18 DIAGNOSIS — E119 Type 2 diabetes mellitus without complications: Secondary | ICD-10-CM | POA: Diagnosis not present

## 2014-04-18 DIAGNOSIS — N2581 Secondary hyperparathyroidism of renal origin: Secondary | ICD-10-CM | POA: Diagnosis not present

## 2014-04-18 DIAGNOSIS — D631 Anemia in chronic kidney disease: Secondary | ICD-10-CM | POA: Diagnosis not present

## 2014-04-24 ENCOUNTER — Ambulatory Visit: Payer: Self-pay | Admitting: Oncology

## 2014-04-24 DIAGNOSIS — D631 Anemia in chronic kidney disease: Secondary | ICD-10-CM | POA: Diagnosis not present

## 2014-04-24 DIAGNOSIS — N183 Chronic kidney disease, stage 3 unspecified: Secondary | ICD-10-CM | POA: Diagnosis not present

## 2014-04-24 DIAGNOSIS — R609 Edema, unspecified: Secondary | ICD-10-CM | POA: Diagnosis not present

## 2014-04-24 DIAGNOSIS — R809 Proteinuria, unspecified: Secondary | ICD-10-CM | POA: Diagnosis not present

## 2014-04-24 DIAGNOSIS — B20 Human immunodeficiency virus [HIV] disease: Secondary | ICD-10-CM | POA: Diagnosis not present

## 2014-04-24 DIAGNOSIS — N189 Chronic kidney disease, unspecified: Secondary | ICD-10-CM | POA: Diagnosis not present

## 2014-04-24 DIAGNOSIS — D696 Thrombocytopenia, unspecified: Secondary | ICD-10-CM | POA: Diagnosis not present

## 2014-04-24 LAB — CANCER CENTER HEMOGLOBIN: HGB: 9.1 g/dL — AB (ref 13.0–18.0)

## 2014-04-26 DIAGNOSIS — D509 Iron deficiency anemia, unspecified: Secondary | ICD-10-CM | POA: Diagnosis not present

## 2014-04-26 DIAGNOSIS — I4891 Unspecified atrial fibrillation: Secondary | ICD-10-CM | POA: Diagnosis not present

## 2014-04-26 DIAGNOSIS — I509 Heart failure, unspecified: Secondary | ICD-10-CM | POA: Diagnosis not present

## 2014-04-26 DIAGNOSIS — N183 Chronic kidney disease, stage 3 unspecified: Secondary | ICD-10-CM | POA: Diagnosis not present

## 2014-05-16 DIAGNOSIS — L97209 Non-pressure chronic ulcer of unspecified calf with unspecified severity: Secondary | ICD-10-CM | POA: Diagnosis not present

## 2014-05-16 DIAGNOSIS — I87319 Chronic venous hypertension (idiopathic) with ulcer of unspecified lower extremity: Secondary | ICD-10-CM | POA: Diagnosis not present

## 2014-05-16 DIAGNOSIS — A4902 Methicillin resistant Staphylococcus aureus infection, unspecified site: Secondary | ICD-10-CM | POA: Diagnosis not present

## 2014-05-16 DIAGNOSIS — L97909 Non-pressure chronic ulcer of unspecified part of unspecified lower leg with unspecified severity: Secondary | ICD-10-CM | POA: Diagnosis not present

## 2014-05-16 DIAGNOSIS — N189 Chronic kidney disease, unspecified: Secondary | ICD-10-CM | POA: Diagnosis not present

## 2014-05-16 DIAGNOSIS — E119 Type 2 diabetes mellitus without complications: Secondary | ICD-10-CM | POA: Diagnosis not present

## 2014-05-16 DIAGNOSIS — I87339 Chronic venous hypertension (idiopathic) with ulcer and inflammation of unspecified lower extremity: Secondary | ICD-10-CM | POA: Diagnosis not present

## 2014-05-16 DIAGNOSIS — R609 Edema, unspecified: Secondary | ICD-10-CM | POA: Diagnosis not present

## 2014-05-20 ENCOUNTER — Ambulatory Visit: Payer: Self-pay | Admitting: Oncology

## 2014-05-22 ENCOUNTER — Ambulatory Visit: Payer: Self-pay | Admitting: Oncology

## 2014-05-22 DIAGNOSIS — N189 Chronic kidney disease, unspecified: Secondary | ICD-10-CM | POA: Diagnosis not present

## 2014-05-22 DIAGNOSIS — D631 Anemia in chronic kidney disease: Secondary | ICD-10-CM | POA: Diagnosis not present

## 2014-05-22 DIAGNOSIS — B20 Human immunodeficiency virus [HIV] disease: Secondary | ICD-10-CM | POA: Diagnosis not present

## 2014-05-22 DIAGNOSIS — D696 Thrombocytopenia, unspecified: Secondary | ICD-10-CM | POA: Diagnosis not present

## 2014-05-22 LAB — CANCER CENTER HEMOGLOBIN: HGB: 9.5 g/dL — ABNORMAL LOW (ref 13.0–18.0)

## 2014-05-29 DIAGNOSIS — D509 Iron deficiency anemia, unspecified: Secondary | ICD-10-CM | POA: Diagnosis not present

## 2014-05-29 DIAGNOSIS — I4891 Unspecified atrial fibrillation: Secondary | ICD-10-CM | POA: Diagnosis not present

## 2014-05-29 DIAGNOSIS — N183 Chronic kidney disease, stage 3 unspecified: Secondary | ICD-10-CM | POA: Diagnosis not present

## 2014-05-29 DIAGNOSIS — B20 Human immunodeficiency virus [HIV] disease: Secondary | ICD-10-CM | POA: Diagnosis not present

## 2014-06-07 DIAGNOSIS — I10 Essential (primary) hypertension: Secondary | ICD-10-CM | POA: Diagnosis not present

## 2014-06-07 DIAGNOSIS — Z95810 Presence of automatic (implantable) cardiac defibrillator: Secondary | ICD-10-CM | POA: Diagnosis not present

## 2014-06-07 DIAGNOSIS — R000 Tachycardia, unspecified: Secondary | ICD-10-CM | POA: Diagnosis not present

## 2014-06-07 DIAGNOSIS — I472 Ventricular tachycardia: Secondary | ICD-10-CM | POA: Diagnosis not present

## 2014-06-07 DIAGNOSIS — E119 Type 2 diabetes mellitus without complications: Secondary | ICD-10-CM | POA: Diagnosis not present

## 2014-06-07 DIAGNOSIS — Z45018 Encounter for adjustment and management of other part of cardiac pacemaker: Secondary | ICD-10-CM | POA: Diagnosis not present

## 2014-06-07 DIAGNOSIS — I4729 Other ventricular tachycardia: Secondary | ICD-10-CM | POA: Diagnosis not present

## 2014-06-13 DIAGNOSIS — L97209 Non-pressure chronic ulcer of unspecified calf with unspecified severity: Secondary | ICD-10-CM | POA: Diagnosis not present

## 2014-06-13 DIAGNOSIS — A4902 Methicillin resistant Staphylococcus aureus infection, unspecified site: Secondary | ICD-10-CM | POA: Diagnosis not present

## 2014-06-13 DIAGNOSIS — R609 Edema, unspecified: Secondary | ICD-10-CM | POA: Diagnosis not present

## 2014-06-13 DIAGNOSIS — I87339 Chronic venous hypertension (idiopathic) with ulcer and inflammation of unspecified lower extremity: Secondary | ICD-10-CM | POA: Diagnosis not present

## 2014-06-13 DIAGNOSIS — E119 Type 2 diabetes mellitus without complications: Secondary | ICD-10-CM | POA: Diagnosis not present

## 2014-06-13 DIAGNOSIS — N189 Chronic kidney disease, unspecified: Secondary | ICD-10-CM | POA: Diagnosis not present

## 2014-06-13 DIAGNOSIS — I87319 Chronic venous hypertension (idiopathic) with ulcer of unspecified lower extremity: Secondary | ICD-10-CM | POA: Diagnosis not present

## 2014-06-16 IMAGING — CR DG CHEST 1V PORT
1 series · 1 of 1 positions shown · non-contrast
Comparison: Prior chest x-ray 12/12/2013

CLINICAL DATA: Swelling, short of breath

EXAM:
PORTABLE CHEST - 1 VIEW

[ap]
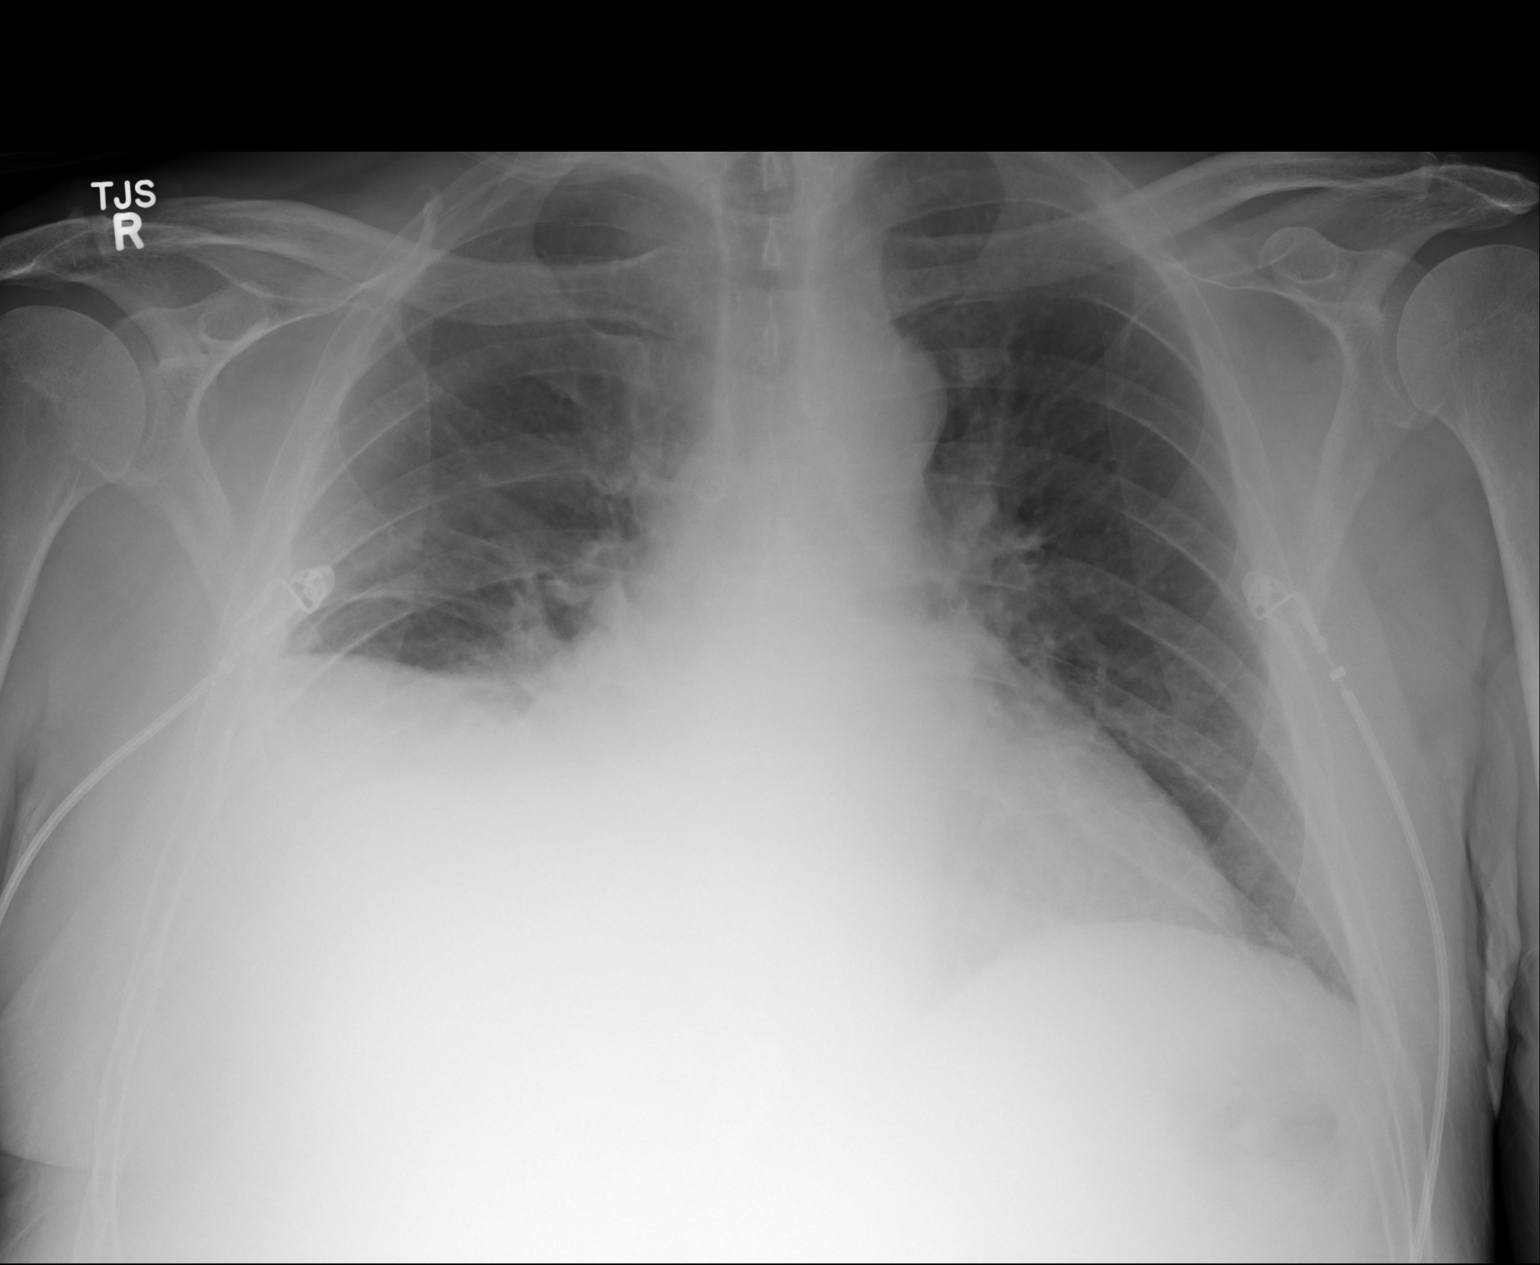

[1 of 1 positions shown; findings below may reference images not displayed]

FINDINGS: Stable cardiomegaly. Interval development of dense right basilar
opacity favored to reflect a layering pleural effusion with
atelectasis and elevation of the right hemidiaphragm. Superimposed
infiltrate is difficult to exclude. Very mild vascular congestion
without overt edema. No pneumothorax. No acute osseous abnormality.
Stable central bronchitic changes and mild left basilar interstitial
prominence.
IMPRESSION: 1. Interval development of a dense right basilar opacity favored to
reflect a layering pleural effusion and atelectasis. Superimposed
infiltrate is difficult to exclude radiographically.
2. Stable cardiomegaly and pulmonary vascular congestion without
edema.

## 2014-06-18 IMAGING — NM NM LUNG SCAN
2 series · 16 of 16 positions shown · non-contrast
Comparison: Chest x-ray from earlier in the same day.

CLINICAL DATA: Shortness of breath and chest pain

EXAM:
NUCLEAR MEDICINE VENTILATION - PERFUSION LUNG SCAN
TECHNIQUE: Ventilation images were obtained in multiple projections using
inhaled aerosol technetium 99 M DTPA. Perfusion images were obtained
in multiple projections after intravenous injection of 8c-BBm MAA.
RADIOPHARMACEUTICALS:  41.06 mCi 8c-BBm DTPA aerosol and 4.31 mCi
8c-BBm MAA

[Series 1000: lung perfusion · 1.95mm/px · 4 acquisitions, 8 frames shown]
[im 1/4]
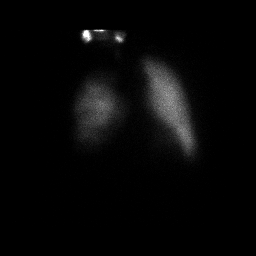
[im 1/4]
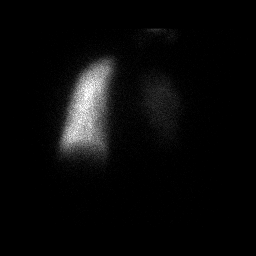
[im 2/4]
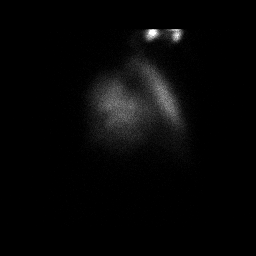
[im 2/4]
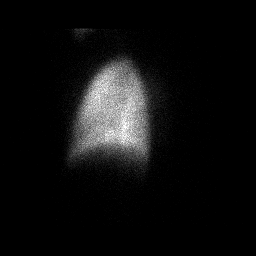
[im 3/4]
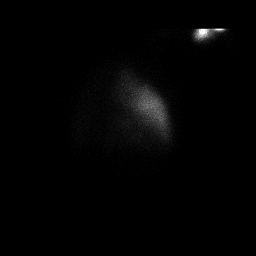
[im 3/4]
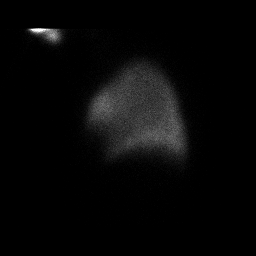
[im 4/4]
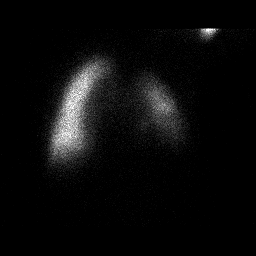
[im 4/4]
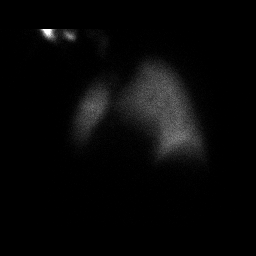

[Series 1000: lung ventilation · 3.90mm/px · 4 acquisitions, 8 frames shown]
[im 1/4]
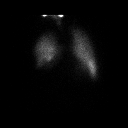
[im 1/4]
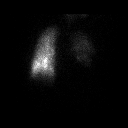
[im 2/4]
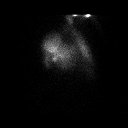
[im 2/4]
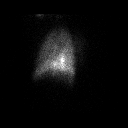
[im 3/4]
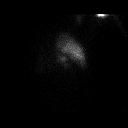
[im 3/4]
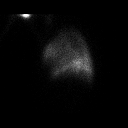
[im 4/4]
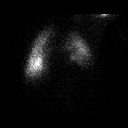
[im 4/4]
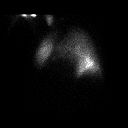

[16 of 16 positions shown; findings below may reference images not displayed]

FINDINGS: Ventilation: There ventilation defects identified inferiorly on the
right corresponding to the known infiltrate and effusion seen on
recent chest x-ray. No other ventilation defects are noted.

Perfusion: Decreased ventilation is noted in the right lung base
consistent with the known effusion and focal infiltrate. No other
perfusion defects are noted
IMPRESSION: Defect related to the known right effusion and infiltrate. No
findings to suggest pulmonary embolism are noted.

## 2014-06-20 ENCOUNTER — Ambulatory Visit: Payer: Self-pay | Admitting: Oncology

## 2014-06-20 DIAGNOSIS — I87339 Chronic venous hypertension (idiopathic) with ulcer and inflammation of unspecified lower extremity: Secondary | ICD-10-CM | POA: Diagnosis not present

## 2014-06-27 DIAGNOSIS — N2581 Secondary hyperparathyroidism of renal origin: Secondary | ICD-10-CM | POA: Diagnosis not present

## 2014-06-27 DIAGNOSIS — N184 Chronic kidney disease, stage 4 (severe): Secondary | ICD-10-CM | POA: Diagnosis not present

## 2014-06-27 DIAGNOSIS — I119 Hypertensive heart disease without heart failure: Secondary | ICD-10-CM | POA: Diagnosis not present

## 2014-06-27 DIAGNOSIS — I4891 Unspecified atrial fibrillation: Secondary | ICD-10-CM | POA: Diagnosis not present

## 2014-06-27 DIAGNOSIS — D631 Anemia in chronic kidney disease: Secondary | ICD-10-CM | POA: Diagnosis not present

## 2014-06-27 DIAGNOSIS — N183 Chronic kidney disease, stage 3 unspecified: Secondary | ICD-10-CM | POA: Diagnosis not present

## 2014-06-27 DIAGNOSIS — I509 Heart failure, unspecified: Secondary | ICD-10-CM | POA: Diagnosis not present

## 2014-06-27 DIAGNOSIS — R809 Proteinuria, unspecified: Secondary | ICD-10-CM | POA: Diagnosis not present

## 2014-07-07 DIAGNOSIS — I4949 Other premature depolarization: Secondary | ICD-10-CM | POA: Diagnosis not present

## 2014-07-07 DIAGNOSIS — I2510 Atherosclerotic heart disease of native coronary artery without angina pectoris: Secondary | ICD-10-CM | POA: Diagnosis not present

## 2014-07-07 DIAGNOSIS — I10 Essential (primary) hypertension: Secondary | ICD-10-CM | POA: Diagnosis not present

## 2014-07-07 DIAGNOSIS — I4891 Unspecified atrial fibrillation: Secondary | ICD-10-CM | POA: Diagnosis not present

## 2014-07-10 DIAGNOSIS — IMO0001 Reserved for inherently not codable concepts without codable children: Secondary | ICD-10-CM | POA: Diagnosis not present

## 2014-07-10 DIAGNOSIS — E11359 Type 2 diabetes mellitus with proliferative diabetic retinopathy without macular edema: Secondary | ICD-10-CM | POA: Diagnosis not present

## 2014-07-11 DIAGNOSIS — B182 Chronic viral hepatitis C: Secondary | ICD-10-CM | POA: Diagnosis not present

## 2014-07-11 DIAGNOSIS — B20 Human immunodeficiency virus [HIV] disease: Secondary | ICD-10-CM | POA: Diagnosis not present

## 2014-07-11 DIAGNOSIS — Z79899 Other long term (current) drug therapy: Secondary | ICD-10-CM | POA: Diagnosis not present

## 2014-07-18 DIAGNOSIS — A4902 Methicillin resistant Staphylococcus aureus infection, unspecified site: Secondary | ICD-10-CM | POA: Diagnosis not present

## 2014-07-18 DIAGNOSIS — R609 Edema, unspecified: Secondary | ICD-10-CM | POA: Diagnosis not present

## 2014-07-18 DIAGNOSIS — E119 Type 2 diabetes mellitus without complications: Secondary | ICD-10-CM | POA: Diagnosis not present

## 2014-07-18 DIAGNOSIS — L97209 Non-pressure chronic ulcer of unspecified calf with unspecified severity: Secondary | ICD-10-CM | POA: Diagnosis not present

## 2014-07-18 DIAGNOSIS — N189 Chronic kidney disease, unspecified: Secondary | ICD-10-CM | POA: Diagnosis not present

## 2014-07-18 DIAGNOSIS — I87319 Chronic venous hypertension (idiopathic) with ulcer of unspecified lower extremity: Secondary | ICD-10-CM | POA: Diagnosis not present

## 2014-07-27 DIAGNOSIS — F4310 Post-traumatic stress disorder, unspecified: Secondary | ICD-10-CM | POA: Diagnosis not present

## 2014-07-27 DIAGNOSIS — Z7901 Long term (current) use of anticoagulants: Secondary | ICD-10-CM | POA: Diagnosis not present

## 2014-07-27 DIAGNOSIS — I4891 Unspecified atrial fibrillation: Secondary | ICD-10-CM | POA: Diagnosis not present

## 2014-07-27 DIAGNOSIS — I509 Heart failure, unspecified: Secondary | ICD-10-CM | POA: Diagnosis not present

## 2014-07-31 DIAGNOSIS — I2510 Atherosclerotic heart disease of native coronary artery without angina pectoris: Secondary | ICD-10-CM | POA: Diagnosis not present

## 2014-07-31 DIAGNOSIS — N183 Chronic kidney disease, stage 3 (moderate): Secondary | ICD-10-CM | POA: Diagnosis not present

## 2014-07-31 DIAGNOSIS — I517 Cardiomegaly: Secondary | ICD-10-CM | POA: Diagnosis not present

## 2014-07-31 DIAGNOSIS — I4891 Unspecified atrial fibrillation: Secondary | ICD-10-CM | POA: Diagnosis not present

## 2014-08-11 DIAGNOSIS — B20 Human immunodeficiency virus [HIV] disease: Secondary | ICD-10-CM | POA: Diagnosis not present

## 2014-08-30 DIAGNOSIS — E119 Type 2 diabetes mellitus without complications: Secondary | ICD-10-CM | POA: Diagnosis not present

## 2014-08-30 DIAGNOSIS — N183 Chronic kidney disease, stage 3 (moderate): Secondary | ICD-10-CM | POA: Diagnosis not present

## 2014-08-30 DIAGNOSIS — I4891 Unspecified atrial fibrillation: Secondary | ICD-10-CM | POA: Diagnosis not present

## 2014-08-30 DIAGNOSIS — I2510 Atherosclerotic heart disease of native coronary artery without angina pectoris: Secondary | ICD-10-CM | POA: Diagnosis not present

## 2014-08-30 DIAGNOSIS — Z7901 Long term (current) use of anticoagulants: Secondary | ICD-10-CM | POA: Diagnosis not present

## 2014-08-31 DIAGNOSIS — E785 Hyperlipidemia, unspecified: Secondary | ICD-10-CM | POA: Diagnosis not present

## 2014-08-31 DIAGNOSIS — E119 Type 2 diabetes mellitus without complications: Secondary | ICD-10-CM | POA: Diagnosis not present

## 2014-08-31 DIAGNOSIS — I10 Essential (primary) hypertension: Secondary | ICD-10-CM | POA: Diagnosis not present

## 2014-09-04 ENCOUNTER — Ambulatory Visit: Payer: Self-pay | Admitting: Internal Medicine

## 2014-09-04 DIAGNOSIS — K7689 Other specified diseases of liver: Secondary | ICD-10-CM | POA: Diagnosis not present

## 2014-09-04 DIAGNOSIS — R188 Other ascites: Secondary | ICD-10-CM | POA: Diagnosis not present

## 2014-09-04 DIAGNOSIS — R161 Splenomegaly, not elsewhere classified: Secondary | ICD-10-CM | POA: Diagnosis not present

## 2014-09-18 ENCOUNTER — Ambulatory Visit: Payer: Self-pay | Admitting: Internal Medicine

## 2014-09-18 DIAGNOSIS — B182 Chronic viral hepatitis C: Secondary | ICD-10-CM | POA: Diagnosis not present

## 2014-09-19 DIAGNOSIS — I469 Cardiac arrest, cause unspecified: Secondary | ICD-10-CM | POA: Diagnosis not present

## 2014-09-19 DIAGNOSIS — I4891 Unspecified atrial fibrillation: Secondary | ICD-10-CM | POA: Diagnosis not present

## 2014-09-19 DIAGNOSIS — I119 Hypertensive heart disease without heart failure: Secondary | ICD-10-CM | POA: Diagnosis not present

## 2014-09-19 DIAGNOSIS — F4310 Post-traumatic stress disorder, unspecified: Secondary | ICD-10-CM | POA: Diagnosis not present

## 2014-09-21 DIAGNOSIS — I5020 Unspecified systolic (congestive) heart failure: Secondary | ICD-10-CM | POA: Diagnosis not present

## 2014-09-21 DIAGNOSIS — B20 Human immunodeficiency virus [HIV] disease: Secondary | ICD-10-CM | POA: Diagnosis not present

## 2014-09-21 DIAGNOSIS — B182 Chronic viral hepatitis C: Secondary | ICD-10-CM | POA: Diagnosis not present

## 2014-09-21 DIAGNOSIS — N19 Unspecified kidney failure: Secondary | ICD-10-CM | POA: Diagnosis not present

## 2014-10-06 DIAGNOSIS — I209 Angina pectoris, unspecified: Secondary | ICD-10-CM | POA: Diagnosis not present

## 2014-10-06 DIAGNOSIS — I480 Paroxysmal atrial fibrillation: Secondary | ICD-10-CM | POA: Diagnosis not present

## 2014-10-06 DIAGNOSIS — N183 Chronic kidney disease, stage 3 (moderate): Secondary | ICD-10-CM | POA: Diagnosis not present

## 2014-10-06 DIAGNOSIS — F4310 Post-traumatic stress disorder, unspecified: Secondary | ICD-10-CM | POA: Diagnosis not present

## 2014-10-06 DIAGNOSIS — I255 Ischemic cardiomyopathy: Secondary | ICD-10-CM | POA: Diagnosis not present

## 2014-10-06 DIAGNOSIS — I10 Essential (primary) hypertension: Secondary | ICD-10-CM | POA: Diagnosis not present

## 2014-10-06 DIAGNOSIS — E8881 Metabolic syndrome: Secondary | ICD-10-CM | POA: Diagnosis not present

## 2014-10-06 DIAGNOSIS — I5022 Chronic systolic (congestive) heart failure: Secondary | ICD-10-CM | POA: Diagnosis not present

## 2014-10-17 DIAGNOSIS — Z95810 Presence of automatic (implantable) cardiac defibrillator: Secondary | ICD-10-CM | POA: Diagnosis not present

## 2014-10-17 DIAGNOSIS — I472 Ventricular tachycardia: Secondary | ICD-10-CM | POA: Diagnosis not present

## 2014-10-30 DIAGNOSIS — A46 Erysipelas: Secondary | ICD-10-CM | POA: Diagnosis not present

## 2014-10-30 DIAGNOSIS — I509 Heart failure, unspecified: Secondary | ICD-10-CM | POA: Diagnosis not present

## 2014-10-30 DIAGNOSIS — E8881 Metabolic syndrome: Secondary | ICD-10-CM | POA: Diagnosis not present

## 2014-10-30 DIAGNOSIS — N183 Chronic kidney disease, stage 3 (moderate): Secondary | ICD-10-CM | POA: Diagnosis not present

## 2014-11-06 DIAGNOSIS — I517 Cardiomegaly: Secondary | ICD-10-CM | POA: Diagnosis not present

## 2014-11-06 DIAGNOSIS — Z7901 Long term (current) use of anticoagulants: Secondary | ICD-10-CM | POA: Diagnosis not present

## 2014-11-06 DIAGNOSIS — E8881 Metabolic syndrome: Secondary | ICD-10-CM | POA: Diagnosis not present

## 2014-11-06 DIAGNOSIS — I119 Hypertensive heart disease without heart failure: Secondary | ICD-10-CM | POA: Diagnosis not present

## 2014-11-06 DIAGNOSIS — I4891 Unspecified atrial fibrillation: Secondary | ICD-10-CM | POA: Diagnosis not present

## 2014-11-19 DIAGNOSIS — R531 Weakness: Secondary | ICD-10-CM | POA: Diagnosis not present

## 2014-11-19 DIAGNOSIS — E875 Hyperkalemia: Secondary | ICD-10-CM | POA: Diagnosis not present

## 2014-11-19 DIAGNOSIS — R42 Dizziness and giddiness: Secondary | ICD-10-CM | POA: Diagnosis not present

## 2014-11-19 DIAGNOSIS — E860 Dehydration: Secondary | ICD-10-CM | POA: Diagnosis not present

## 2014-11-19 DIAGNOSIS — R55 Syncope and collapse: Secondary | ICD-10-CM | POA: Diagnosis not present

## 2014-11-19 LAB — TROPONIN I

## 2014-11-19 LAB — BASIC METABOLIC PANEL
ANION GAP: 4 — AB (ref 7–16)
BUN: 41 mg/dL — ABNORMAL HIGH (ref 7–18)
CO2: 28 mmol/L (ref 21–32)
CREATININE: 2.67 mg/dL — AB (ref 0.60–1.30)
Calcium, Total: 8.3 mg/dL — ABNORMAL LOW (ref 8.5–10.1)
Chloride: 102 mmol/L (ref 98–107)
EGFR (Non-African Amer.): 25 — ABNORMAL LOW
GFR CALC AF AMER: 31 — AB
GLUCOSE: 245 mg/dL — AB (ref 65–99)
Osmolality: 286 (ref 275–301)
Potassium: 6.5 mmol/L (ref 3.5–5.1)
Sodium: 134 mmol/L — ABNORMAL LOW (ref 136–145)

## 2014-11-19 LAB — CBC WITH DIFFERENTIAL/PLATELET
BASOS PCT: 0.3 %
Basophil #: 0 10*3/uL (ref 0.0–0.1)
EOS ABS: 0.1 10*3/uL (ref 0.0–0.7)
Eosinophil %: 1 %
HCT: 33.6 % — ABNORMAL LOW (ref 40.0–52.0)
HGB: 11.2 g/dL — ABNORMAL LOW (ref 13.0–18.0)
LYMPHS PCT: 20.8 %
Lymphocyte #: 1.2 10*3/uL (ref 1.0–3.6)
MCH: 39.9 pg — ABNORMAL HIGH (ref 26.0–34.0)
MCHC: 33.5 g/dL (ref 32.0–36.0)
MCV: 119 fL — ABNORMAL HIGH (ref 80–100)
Monocyte #: 0.7 x10 3/mm (ref 0.2–1.0)
Monocyte %: 11.7 %
NEUTROS PCT: 66.2 %
Neutrophil #: 3.8 10*3/uL (ref 1.4–6.5)
Platelet: 71 10*3/uL — ABNORMAL LOW (ref 150–440)
RBC: 2.82 10*6/uL — ABNORMAL LOW (ref 4.40–5.90)
RDW: 13.4 % (ref 11.5–14.5)
WBC: 5.7 10*3/uL (ref 3.8–10.6)

## 2014-11-19 LAB — URINALYSIS, COMPLETE
Bacteria: NONE SEEN
Bilirubin,UR: NEGATIVE
Blood: NEGATIVE
Glucose,UR: 50 mg/dL (ref 0–75)
KETONE: NEGATIVE
Leukocyte Esterase: NEGATIVE
NITRITE: NEGATIVE
PH: 6 (ref 4.5–8.0)
Protein: 100
Specific Gravity: 1.011 (ref 1.003–1.030)
Squamous Epithelial: NONE SEEN

## 2014-11-20 ENCOUNTER — Inpatient Hospital Stay: Payer: Self-pay | Admitting: Internal Medicine

## 2014-11-20 DIAGNOSIS — I272 Other secondary pulmonary hypertension: Secondary | ICD-10-CM | POA: Diagnosis present

## 2014-11-20 DIAGNOSIS — N184 Chronic kidney disease, stage 4 (severe): Secondary | ICD-10-CM | POA: Diagnosis present

## 2014-11-20 DIAGNOSIS — I6522 Occlusion and stenosis of left carotid artery: Secondary | ICD-10-CM | POA: Diagnosis not present

## 2014-11-20 DIAGNOSIS — I4891 Unspecified atrial fibrillation: Secondary | ICD-10-CM | POA: Diagnosis present

## 2014-11-20 DIAGNOSIS — E875 Hyperkalemia: Secondary | ICD-10-CM | POA: Diagnosis not present

## 2014-11-20 DIAGNOSIS — Z86718 Personal history of other venous thrombosis and embolism: Secondary | ICD-10-CM | POA: Diagnosis not present

## 2014-11-20 DIAGNOSIS — R188 Other ascites: Secondary | ICD-10-CM | POA: Diagnosis not present

## 2014-11-20 DIAGNOSIS — E119 Type 2 diabetes mellitus without complications: Secondary | ICD-10-CM | POA: Diagnosis present

## 2014-11-20 DIAGNOSIS — Z87891 Personal history of nicotine dependence: Secondary | ICD-10-CM | POA: Diagnosis not present

## 2014-11-20 DIAGNOSIS — E860 Dehydration: Secondary | ICD-10-CM | POA: Diagnosis not present

## 2014-11-20 DIAGNOSIS — Z95810 Presence of automatic (implantable) cardiac defibrillator: Secondary | ICD-10-CM | POA: Diagnosis not present

## 2014-11-20 DIAGNOSIS — Z7901 Long term (current) use of anticoagulants: Secondary | ICD-10-CM | POA: Diagnosis not present

## 2014-11-20 DIAGNOSIS — Z888 Allergy status to other drugs, medicaments and biological substances status: Secondary | ICD-10-CM | POA: Diagnosis not present

## 2014-11-20 DIAGNOSIS — R531 Weakness: Secondary | ICD-10-CM | POA: Diagnosis not present

## 2014-11-20 DIAGNOSIS — I10 Essential (primary) hypertension: Secondary | ICD-10-CM | POA: Diagnosis not present

## 2014-11-20 DIAGNOSIS — R809 Proteinuria, unspecified: Secondary | ICD-10-CM | POA: Diagnosis not present

## 2014-11-20 DIAGNOSIS — I482 Chronic atrial fibrillation: Secondary | ICD-10-CM | POA: Diagnosis present

## 2014-11-20 DIAGNOSIS — Z885 Allergy status to narcotic agent status: Secondary | ICD-10-CM | POA: Diagnosis not present

## 2014-11-20 DIAGNOSIS — J9811 Atelectasis: Secondary | ICD-10-CM | POA: Diagnosis not present

## 2014-11-20 DIAGNOSIS — B1920 Unspecified viral hepatitis C without hepatic coma: Secondary | ICD-10-CM | POA: Diagnosis present

## 2014-11-20 DIAGNOSIS — Z8674 Personal history of sudden cardiac arrest: Secondary | ICD-10-CM | POA: Diagnosis not present

## 2014-11-20 DIAGNOSIS — I25119 Atherosclerotic heart disease of native coronary artery with unspecified angina pectoris: Secondary | ICD-10-CM | POA: Diagnosis not present

## 2014-11-20 DIAGNOSIS — Z794 Long term (current) use of insulin: Secondary | ICD-10-CM | POA: Diagnosis not present

## 2014-11-20 DIAGNOSIS — I2510 Atherosclerotic heart disease of native coronary artery without angina pectoris: Secondary | ICD-10-CM | POA: Diagnosis present

## 2014-11-20 DIAGNOSIS — I255 Ischemic cardiomyopathy: Secondary | ICD-10-CM | POA: Diagnosis present

## 2014-11-20 DIAGNOSIS — R55 Syncope and collapse: Secondary | ICD-10-CM | POA: Diagnosis not present

## 2014-11-20 DIAGNOSIS — Z9111 Patient's noncompliance with dietary regimen: Secondary | ICD-10-CM | POA: Diagnosis present

## 2014-11-20 DIAGNOSIS — I5023 Acute on chronic systolic (congestive) heart failure: Secondary | ICD-10-CM | POA: Diagnosis not present

## 2014-11-20 DIAGNOSIS — N183 Chronic kidney disease, stage 3 (moderate): Secondary | ICD-10-CM | POA: Diagnosis not present

## 2014-11-20 DIAGNOSIS — I517 Cardiomegaly: Secondary | ICD-10-CM | POA: Diagnosis not present

## 2014-11-20 DIAGNOSIS — R42 Dizziness and giddiness: Secondary | ICD-10-CM | POA: Diagnosis not present

## 2014-11-20 DIAGNOSIS — K7689 Other specified diseases of liver: Secondary | ICD-10-CM | POA: Diagnosis present

## 2014-11-20 DIAGNOSIS — D696 Thrombocytopenia, unspecified: Secondary | ICD-10-CM | POA: Diagnosis present

## 2014-11-20 DIAGNOSIS — I6521 Occlusion and stenosis of right carotid artery: Secondary | ICD-10-CM | POA: Diagnosis not present

## 2014-11-20 LAB — HEPATIC FUNCTION PANEL A (ARMC)
Albumin: 3 g/dL — ABNORMAL LOW (ref 3.4–5.0)
Alkaline Phosphatase: 91 U/L (ref 46–116)
Bilirubin, Direct: 0.3 mg/dL — ABNORMAL HIGH (ref 0.0–0.2)
Bilirubin,Total: 1.5 mg/dL — ABNORMAL HIGH (ref 0.2–1.0)
SGOT(AST): 51 U/L — ABNORMAL HIGH (ref 15–37)
SGPT (ALT): 65 U/L — ABNORMAL HIGH (ref 14–63)
Total Protein: 6.6 g/dL (ref 6.4–8.2)

## 2014-11-20 LAB — BASIC METABOLIC PANEL
Anion Gap: 0 — ABNORMAL LOW (ref 7–16)
BUN: 43 mg/dL — AB (ref 7–18)
CHLORIDE: 104 mmol/L (ref 98–107)
CREATININE: 2.75 mg/dL — AB (ref 0.60–1.30)
Calcium, Total: 8.3 mg/dL — ABNORMAL LOW (ref 8.5–10.1)
Co2: 32 mmol/L (ref 21–32)
EGFR (African American): 30 — ABNORMAL LOW
GFR CALC NON AF AMER: 24 — AB
Glucose: 211 mg/dL — ABNORMAL HIGH (ref 65–99)
Osmolality: 289 (ref 275–301)
POTASSIUM: 6.4 mmol/L — AB (ref 3.5–5.1)
SODIUM: 136 mmol/L (ref 136–145)

## 2014-11-20 LAB — POTASSIUM
Potassium: 5.2 mmol/L — ABNORMAL HIGH (ref 3.5–5.1)
Potassium: 6.4 mmol/L — ABNORMAL HIGH (ref 3.5–5.1)

## 2014-11-20 LAB — PROTIME-INR
INR: 1.7
Prothrombin Time: 20.2 secs — ABNORMAL HIGH

## 2014-11-20 LAB — MAGNESIUM: Magnesium: 2.3 mg/dL

## 2014-11-20 LAB — TROPONIN I
Troponin-I: <0.02 ng/mL
Troponin-I: <0.02 ng/mL

## 2014-11-21 LAB — URINE CULTURE

## 2014-11-28 DIAGNOSIS — B182 Chronic viral hepatitis C: Secondary | ICD-10-CM | POA: Diagnosis not present

## 2014-11-28 DIAGNOSIS — K7460 Unspecified cirrhosis of liver: Secondary | ICD-10-CM | POA: Diagnosis not present

## 2014-11-28 DIAGNOSIS — B20 Human immunodeficiency virus [HIV] disease: Secondary | ICD-10-CM | POA: Diagnosis not present

## 2014-11-30 DIAGNOSIS — N2581 Secondary hyperparathyroidism of renal origin: Secondary | ICD-10-CM | POA: Diagnosis not present

## 2014-11-30 DIAGNOSIS — N184 Chronic kidney disease, stage 4 (severe): Secondary | ICD-10-CM | POA: Diagnosis not present

## 2014-11-30 DIAGNOSIS — D631 Anemia in chronic kidney disease: Secondary | ICD-10-CM | POA: Diagnosis not present

## 2014-11-30 DIAGNOSIS — E875 Hyperkalemia: Secondary | ICD-10-CM | POA: Diagnosis not present

## 2014-11-30 DIAGNOSIS — R809 Proteinuria, unspecified: Secondary | ICD-10-CM | POA: Diagnosis not present

## 2014-12-29 DIAGNOSIS — D509 Iron deficiency anemia, unspecified: Secondary | ICD-10-CM | POA: Diagnosis not present

## 2014-12-29 DIAGNOSIS — R5381 Other malaise: Secondary | ICD-10-CM | POA: Diagnosis not present

## 2014-12-29 DIAGNOSIS — Z125 Encounter for screening for malignant neoplasm of prostate: Secondary | ICD-10-CM | POA: Diagnosis not present

## 2014-12-29 DIAGNOSIS — I4891 Unspecified atrial fibrillation: Secondary | ICD-10-CM | POA: Diagnosis not present

## 2014-12-29 DIAGNOSIS — E784 Other hyperlipidemia: Secondary | ICD-10-CM | POA: Diagnosis not present

## 2014-12-29 DIAGNOSIS — I10 Essential (primary) hypertension: Secondary | ICD-10-CM | POA: Diagnosis not present

## 2015-01-02 DIAGNOSIS — R55 Syncope and collapse: Secondary | ICD-10-CM | POA: Diagnosis not present

## 2015-01-02 DIAGNOSIS — N183 Chronic kidney disease, stage 3 (moderate): Secondary | ICD-10-CM | POA: Diagnosis not present

## 2015-01-02 DIAGNOSIS — I509 Heart failure, unspecified: Secondary | ICD-10-CM | POA: Diagnosis not present

## 2015-01-02 DIAGNOSIS — I6521 Occlusion and stenosis of right carotid artery: Secondary | ICD-10-CM | POA: Diagnosis not present

## 2015-01-02 DIAGNOSIS — E875 Hyperkalemia: Secondary | ICD-10-CM | POA: Diagnosis not present

## 2015-01-02 DIAGNOSIS — I6529 Occlusion and stenosis of unspecified carotid artery: Secondary | ICD-10-CM | POA: Diagnosis not present

## 2015-01-04 DIAGNOSIS — I2510 Atherosclerotic heart disease of native coronary artery without angina pectoris: Secondary | ICD-10-CM | POA: Diagnosis not present

## 2015-01-04 DIAGNOSIS — I4891 Unspecified atrial fibrillation: Secondary | ICD-10-CM | POA: Diagnosis not present

## 2015-01-04 DIAGNOSIS — I119 Hypertensive heart disease without heart failure: Secondary | ICD-10-CM | POA: Diagnosis not present

## 2015-01-04 DIAGNOSIS — I739 Peripheral vascular disease, unspecified: Secondary | ICD-10-CM | POA: Diagnosis not present

## 2015-01-05 ENCOUNTER — Encounter
Admit: 2015-01-05 | Disposition: A | Discharge status: Home / Self Care | Payer: Self-pay | Attending: Surgery | Admitting: Surgery

## 2015-01-05 DIAGNOSIS — S81801A Unspecified open wound, right lower leg, initial encounter: Secondary | ICD-10-CM | POA: Diagnosis not present

## 2015-01-05 DIAGNOSIS — E11621 Type 2 diabetes mellitus with foot ulcer: Secondary | ICD-10-CM | POA: Diagnosis not present

## 2015-01-05 DIAGNOSIS — B182 Chronic viral hepatitis C: Secondary | ICD-10-CM | POA: Diagnosis not present

## 2015-01-05 DIAGNOSIS — I87311 Chronic venous hypertension (idiopathic) with ulcer of right lower extremity: Secondary | ICD-10-CM | POA: Diagnosis not present

## 2015-01-05 DIAGNOSIS — B20 Human immunodeficiency virus [HIV] disease: Secondary | ICD-10-CM | POA: Diagnosis not present

## 2015-01-08 DIAGNOSIS — E11359 Type 2 diabetes mellitus with proliferative diabetic retinopathy without macular edema: Secondary | ICD-10-CM | POA: Diagnosis not present

## 2015-01-11 DIAGNOSIS — E11621 Type 2 diabetes mellitus with foot ulcer: Secondary | ICD-10-CM | POA: Diagnosis not present

## 2015-01-11 DIAGNOSIS — B182 Chronic viral hepatitis C: Secondary | ICD-10-CM | POA: Diagnosis not present

## 2015-01-11 DIAGNOSIS — S81801A Unspecified open wound, right lower leg, initial encounter: Secondary | ICD-10-CM | POA: Diagnosis not present

## 2015-01-11 DIAGNOSIS — B20 Human immunodeficiency virus [HIV] disease: Secondary | ICD-10-CM | POA: Diagnosis not present

## 2015-01-11 DIAGNOSIS — I87311 Chronic venous hypertension (idiopathic) with ulcer of right lower extremity: Secondary | ICD-10-CM | POA: Diagnosis not present

## 2015-01-19 ENCOUNTER — Encounter
Admit: 2015-01-19 | Disposition: A | Discharge status: Home / Self Care | Payer: Self-pay | Attending: Surgery | Admitting: Surgery

## 2015-01-19 DIAGNOSIS — S81801A Unspecified open wound, right lower leg, initial encounter: Secondary | ICD-10-CM | POA: Diagnosis not present

## 2015-01-19 DIAGNOSIS — E11621 Type 2 diabetes mellitus with foot ulcer: Secondary | ICD-10-CM | POA: Diagnosis not present

## 2015-01-19 DIAGNOSIS — B182 Chronic viral hepatitis C: Secondary | ICD-10-CM | POA: Diagnosis not present

## 2015-01-19 DIAGNOSIS — B20 Human immunodeficiency virus [HIV] disease: Secondary | ICD-10-CM | POA: Diagnosis not present

## 2015-01-19 DIAGNOSIS — I87311 Chronic venous hypertension (idiopathic) with ulcer of right lower extremity: Secondary | ICD-10-CM | POA: Diagnosis not present

## 2015-01-22 DIAGNOSIS — D631 Anemia in chronic kidney disease: Secondary | ICD-10-CM | POA: Diagnosis not present

## 2015-01-22 DIAGNOSIS — N2581 Secondary hyperparathyroidism of renal origin: Secondary | ICD-10-CM | POA: Diagnosis not present

## 2015-01-22 DIAGNOSIS — N183 Chronic kidney disease, stage 3 (moderate): Secondary | ICD-10-CM | POA: Diagnosis not present

## 2015-01-22 DIAGNOSIS — N184 Chronic kidney disease, stage 4 (severe): Secondary | ICD-10-CM | POA: Diagnosis not present

## 2015-01-22 DIAGNOSIS — I10 Essential (primary) hypertension: Secondary | ICD-10-CM | POA: Diagnosis not present

## 2015-01-25 DIAGNOSIS — B182 Chronic viral hepatitis C: Secondary | ICD-10-CM | POA: Diagnosis not present

## 2015-01-25 DIAGNOSIS — I87311 Chronic venous hypertension (idiopathic) with ulcer of right lower extremity: Secondary | ICD-10-CM | POA: Diagnosis not present

## 2015-01-25 DIAGNOSIS — E11621 Type 2 diabetes mellitus with foot ulcer: Secondary | ICD-10-CM | POA: Diagnosis not present

## 2015-01-25 DIAGNOSIS — L97311 Non-pressure chronic ulcer of right ankle limited to breakdown of skin: Secondary | ICD-10-CM | POA: Diagnosis not present

## 2015-01-25 DIAGNOSIS — S81801A Unspecified open wound, right lower leg, initial encounter: Secondary | ICD-10-CM | POA: Diagnosis not present

## 2015-01-25 DIAGNOSIS — B20 Human immunodeficiency virus [HIV] disease: Secondary | ICD-10-CM | POA: Diagnosis not present

## 2015-02-01 DIAGNOSIS — B20 Human immunodeficiency virus [HIV] disease: Secondary | ICD-10-CM | POA: Diagnosis not present

## 2015-02-01 DIAGNOSIS — B182 Chronic viral hepatitis C: Secondary | ICD-10-CM | POA: Diagnosis not present

## 2015-02-01 DIAGNOSIS — E11621 Type 2 diabetes mellitus with foot ulcer: Secondary | ICD-10-CM | POA: Diagnosis not present

## 2015-02-01 DIAGNOSIS — I87311 Chronic venous hypertension (idiopathic) with ulcer of right lower extremity: Secondary | ICD-10-CM | POA: Diagnosis not present

## 2015-02-01 DIAGNOSIS — S81801A Unspecified open wound, right lower leg, initial encounter: Secondary | ICD-10-CM | POA: Diagnosis not present

## 2015-02-06 ENCOUNTER — Ambulatory Visit
Admit: 2015-02-06 | Disposition: A | Discharge status: Home / Self Care | Payer: Self-pay | Attending: Surgery | Admitting: Surgery

## 2015-02-06 DIAGNOSIS — L97319 Non-pressure chronic ulcer of right ankle with unspecified severity: Secondary | ICD-10-CM | POA: Diagnosis not present

## 2015-02-06 DIAGNOSIS — I5022 Chronic systolic (congestive) heart failure: Secondary | ICD-10-CM | POA: Diagnosis not present

## 2015-02-06 DIAGNOSIS — S91001A Unspecified open wound, right ankle, initial encounter: Secondary | ICD-10-CM | POA: Diagnosis not present

## 2015-02-06 DIAGNOSIS — S91301A Unspecified open wound, right foot, initial encounter: Secondary | ICD-10-CM | POA: Diagnosis not present

## 2015-02-06 DIAGNOSIS — I2510 Atherosclerotic heart disease of native coronary artery without angina pectoris: Secondary | ICD-10-CM | POA: Diagnosis not present

## 2015-02-06 DIAGNOSIS — L97519 Non-pressure chronic ulcer of other part of right foot with unspecified severity: Secondary | ICD-10-CM | POA: Diagnosis not present

## 2015-02-06 DIAGNOSIS — I255 Ischemic cardiomyopathy: Secondary | ICD-10-CM | POA: Diagnosis not present

## 2015-02-06 DIAGNOSIS — I519 Heart disease, unspecified: Secondary | ICD-10-CM | POA: Diagnosis not present

## 2015-02-06 NOTE — Discharge Summary (Signed)
PATIENT NAME:  Robert Bentley, Robert Bentley MR#:  [id number] DATE OF BIRTH:  [birth year]  DATE OF ADMISSION:  07/30/2012 DATE OF DISCHARGE:  07/31/2012  PRIMARY CARE PHYSICIAN: Cletis Athens, MD  PRIMARY CARDIOLOGIST: Isaias Cowman, MD  CONSULTANTS:  Bartholome Bill, MD - Cardiology.   IMAGING STUDIES: Chest x-ray showed no acute cardiopulmonary disease.   DISCHARGE DIAGNOSES:  1. Acute bronchitis. 2. Pleuritic chest pain secondary to acute bronchitis.  3. Uncontrolled hypertension.  4. Hyperkalemia.  5. Coronary artery disease, stable.   DISCHARGE MEDICATIONS:  1. Hydrochlorothiazide 12.5 mg oral once a day. 2. Plavix 75 mg oral daily.  3. Aspirin 81 mg oral daily.  4. Coreg 12.5 mg oral two times a day.  5. Glimepiride 2 mg oral three times daily. 6. Imdur 60 mg oral once a day.  7. Vicodin 7.5 mg 1 to 2 tablets orally every six hours as needed for pain.  8. Lamivudine/zidovudine 1 tablet oral twice a day. 9. Januvia 50 mg oral once a day.  10. Norvir 100 mg oral once a day.  11. Reyataz 300 mg oral once a day.  12. Prednisone 10 mg taper over five days starting at 60 mg.  13. ProAir HFA 2 puffs every four hours as needed for shortness of breath or wheezing.  14. Ondansetron 4 mg oral tablet four times daily as needed for nausea.  15. Levaquin 500 mg oral once a day for five days.   ADMITTING HISTORY AND PHYSICAL: Please see detailed history and physical dictated on 07/30/2012. In brief, this is a 74 year old male patient with past history of coronary artery disease and HIV who presented to the Emergency Room complaining of nausea, some chest pain and cough. The patient was admitted to rule out acute coronary syndrome.   HOSPITAL COURSE: The patient was seen by Dr. Ubaldo Glassing of cardiology who suggested the patient might have acute bronchitis with EKG being unchanged, cardiac enzymes being normal, and the pain was from acute bronchitis and his coronary artery disease was stable. The patient is  being discharged home for acute bronchitis with steroid taper of six days of Levaquin, ProAir HFA and some Zofran for his nausea, which is likely secondary from his coughing and bronchitis. The patient had a chest x-ray showed no acute cardiopulmonary disease, no infiltrates. The patient is afebrile and white count is normal. On the day of discharge, the patient's blood pressure is 170/84, pulse 84, and saturating 96% on room air. His lung sounds show wheezing.   The patient has uncontrolled hypertension for which he has been started on hydrochlorothiazide. He does have mild hyperkalemia at 5.3 with no EKG changes and the hydrochlorothiazide should help his hyperkalemia. The patient needs a repeat BMP with his primary care physician in a week.   DISCHARGE INSTRUCTIONS: Follow up with Dr. Cletis Athens in a week and Dr. Saralyn Pilar as needed. The patient needs to have repeat BMP done in a week to follow up on his potassium. He will be on a low potassium, diabetic cardiac diet with activity as tolerated.   TIME SPENT: Time spent today on discharge dictation along with coordinating care and counseling of the patient was 35 minutes. ____________________________ Robert Alf Daivd Fredericksen, MD srs:slb D: 07/31/2012 09:56:07 ET     T: 07/31/2012 10:38:23 ET       JOB#: [id number] cc: Alveta Heimlich R. Cleopha Indelicato, MD, <Dictator> Cletis Athens, MD Isaias Cowman, MD Neita Carp MD ELECTRONICALLY SIGNED 08/16/2012 13:57

## 2015-02-06 NOTE — Consult Note (Signed)
General Aspect 74 year old male with history of ischemic cardiomyopathy, status post PCI, history of HIV infection after blood transfusion in the distant past who was admitted after developing cough, low-grade fever, chest and arm discomfort. He complained of nausea and vomiting as well. He presented to the emergency room where her EKG revealed no acute ischemic tracing. He has ruled out for myocardial infarction. Patient states she has been compliant with his medications. His symptoms have improved dramatically since admission. He denies syncope or presyncope. He states the pain was somewhat atypical of his angina. Hischest x-ray did not reveal significant cardiopulmonary disease.   Physical Exam:   GEN well developed, well nourished, no acute distress    HEENT PERRL, hearing intact to voice    NECK supple  No masses    RESP normal resp effort  clear BS  no use of accessory muscles    CARD Regular rate and rhythm  Normal, S1, S2  Murmur    Murmur Systolic    Systolic Murmur axilla    ABD denies tenderness  denies Flank Tenderness  normal BS    LYMPH negative neck    EXTR negative cyanosis/clubbing, negative edema    SKIN normal to palpation    NEURO cranial nerves intact, motor/sensory function intact    PSYCH A+O to time, place, person   Review of Systems:   Subjective/Chief Complaint chest pain, shortness of breath with abdominal discomfort    General: Fever/chills    Skin: No Complaints    ENT: No Complaints    Eyes: No Complaints    Neck: No Complaints    Respiratory: Short of breath    Cardiovascular: Chest pain or discomfort  Palpitations    Gastrointestinal: Nausea  abdominal discomfort with nausea    Genitourinary: No Complaints    Vascular: No Complaints    Musculoskeletal: No Complaints    Neurologic: No Complaints    Hematologic: No Complaints    Endocrine: No Complaints    Psychiatric: No Complaints    Review of Systems: All other  systems were reviewed and found to be negative    Medications/Allergies Reviewed Medications/Allergies reviewed     Enlarged Heart:    irratic heart beat:    Sudden Cardiac Arrest Syndrome:    Multi-drug Resistant Organism (MDRO): Positive culture for MRSA., 24-Oct-2010   Diabetes:    Myocardial Infarct:    Stent - Cardiac:   Home Medications: Medication Instructions Status  aspirin EC 81 81 mg po daily  Active  plavix 75 mg po daily  Active  carvedilol 12.5 mg oral tablet 1 tab(s) orally 2 times a day Active  glimepiride 2 mg oral tablet 1 tab(s) orally 3 times a day one at breakfast  one at lunch and one at dinner Active  Imdur 60 mg oral tablet, extended release tab(s) orally once a day Active  Vicodin 7.5/325 1-2 tab(s) orally every 4 to 6 hours, As Needed- for Pain  Active  lamiVUDine-zidovudine 150 mg-300 mg oral tablet 1 tab(s) orally 2 times a day Active  Januvia 50 mg oral tablet 1 tab(s) orally once a day Active  Norvir 100 mg oral tablet tab(s) orally once a day Active  Reyataz 300 mg oral capsule 1 cap(s) orally once a day Active   EKG:   EKG NSR    Abnormal NSSTTW changes   Radiology Results: XRay:    11-Oct-13 07:49, Chest PA and Lateral   Chest PA and Lateral    REASON FOR  EXAM:    chest discomfort  COMMENTS:   May transport without cardiac monitor    PROCEDURE: DXR - DXR CHEST PA (OR AP) AND LATERAL  - Jul 30 2012  7:49AM     RESULT: Comparison is made to images dated 05 July 2011.    The lungs are clear. The heart and pulmonary vessels are normal. The bony   and mediastinal structures are unremarkable. There is no effusion. There   is no pneumothorax or evidence of congestive failure.    IMPRESSION:  No acute cardiopulmonary disease.    Dictation Site: 2      Verified By: Sundra Aland, M.D., MD    Other- Explain in Comments Line: Unknown  Morphine: Unknown    Impression patient is a 74 year old male with history of ischemic  cardiomyopathy with an ejection fraction of approximately 25% by echocardiogram done in 2010. He was admitted with chest discomfort, low-grade fever, cough, shortness of breath and upper abdominal discomfort. Chest x-ray revealed no acute cardiopulmonary disease with no evidence of congestive heart failure. He has ruled out for myocardial infarction with negative serum troponin. EKG reveals no significant acute injury. He has improved with oxygen and has had no further symptoms. He states he is compliant with his medications. He then did not appear to be in acute ischemic event. Given his lack of cardiac marker elevation and normal EKG, does not appear to have had significant progression of his coronary disease.    Plan 1. Continue to treat with empiric antibiotics 2. Continue current medications including Plavix 3. Ambulation in the morning and follow for progression of symptoms 4. Patient remains stable, would consider discharge in a.m. With Followup As an Outpatient with His Primary Cardiologist Dr. Saralyn Pilar   Electronic Signatures: Teodoro Spray (MD)  (Signed 11-Oct-13 21:34)  Authored: General Aspect/Present Illness, History and Physical Exam, Review of System, Past Medical History, Home Medications, EKG , Radiology, Allergies, Impression/Plan   Last Updated: 11-Oct-13 21:34 by Teodoro Spray (MD)

## 2015-02-06 NOTE — H&P (Signed)
PATIENT NAME:  Robert Bentley, Robert Bentley MR#:  [id number] DATE OF BIRTH:  05-May-1941  DATE OF ADMISSION:  07/30/2012  PRIMARY CARE PHYSICIAN: Cletis Athens, MD  CARDIOLOGIST: Isaias Cowman, MD  CHIEF COMPLAINT: Chest tightness, burning sensation, and nausea.   History is obtained from the patient and his wife present in the room.   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 He was in his usual state of health before 3 to 4 days, but then he started feeling congestion in his sinuses and in his chest with some cough and yellowish sputum production so he went to his primary medical doctor, Dr. Cletis Athens, and he prescribed him azithromycin for the possibility of a pneumonia or bronchitis. The patient started taking it, he finished three days of the course, since these symptoms and the treatment started he is feeling some upset in his stomach and burning sensation in his chest associated with nausea which gets worse on having a deep breath and sometimes making him cough too. The pain and burning sensation is retrosternal. He denies any palpitations, any dizziness or episode of passing out. He says he had fever, which was low-grade, associated with these symptoms for the last 2 to 3 days and feeling a lot of need to drink water, feeling thirsty all the time. As per him this morning, he felt this nauseous and chest tightness so he decided to come to the ER. On the way, he had some breakfast and drank some juice and he felt a little better, relieved from the symptoms. While in the ER, he again started feeling nauseous and he felt better after getting Zofran. So the [facility] team is consulted for these complaints and we are planning to admit him under observation to rule out any cardiac origin for his complaints.   REVIEW OF SYSTEMS:  CONSTITUTIONAL: He denies any fatigue, weakness, pain, or weight loss. He says he had some low-grade fever for the last 2 to 3 days. EYES: Denies any blurring or double lesion in the eye or any redness. ENT: Denies any tinnitus, ear pain, discharge, or hearing loss. RESPIRATORY: Mild cough which is dry now, but before 2 to 3 days there was some sputum production associated with it. Denies any wheezing or blood in the sputum. CARDIOVASCULAR: He has some chest pain which is retrosternal and burning. Denies any orthopnea or palpitations or any swelling of the legs. GASTROINTESTINAL: Has nausea but denies any vomiting. No abdominal pain, diarrhea, or constipation. GENITOURINARY: Denies dysuria or frequency of the urine or burning from the urine. HEMATOLOGIC: Denies anemia, easy bruising, bleeding, or swollen glands. MUSCULOSKELETAL: Denies any pain or swelling of the joints. He has chronic swelling of his right leg and food and there is a cast present on that because of the issues of chronic ulcer, but he denies any ongoing pain or change in status with that. NEUROLOGICAL: Denies any numbness or weakness, any seizures or headache. PSYCHIATRIC: Denies any anxiety, insomnia, or depressive symptoms.   PAST MEDICAL HISTORY:  1. Coronary artery disease. 2. Congestive heart failure. 3. Chronic systolic failure, ejection fraction 25% as per echocardiogram in 2010. 4. Diabetes mellitus. 5. HIV.  6. Chronic nonhealing ulcer, right foot.  PAST SURGICAL HISTORY:  1. Gallbladder surgery in 1983 for which he needed many blood transfusions and since then he was diagnosed with HIV. 2. Appendectomy  in the past.   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 He stopped smoking in 1977 and drinks very rarely.  FAMILY HISTORY: On the maternal side of family almost everybody has diabetes mellitus and his mother had some cardiac problem and congestive heart failure. He denies any history of cancer in the  family.  HOME MEDICATIONS: 1. Plavix 75 mg p.o. daily. 2. Januvia 25 mg p.o. daily.  3. Imdur 60 mg oral once a day. 4. Glimepiride 2 mg three times daily. 5. Carvedilol 12.5 mg orally twice a day. 6. Aspirin 81 mg daily.  7. Reyataz, Norvir, and lamivudine for HIV.   PHYSICAL EXAMINATION:  VITALS: Temperature 97, pulse rate 87, respirations 18, blood pressure 140/72, and pulse oximetry 99% on room air.   GENERAL: Well-developed, well-nourished and does not appear in any acute distress.   HEENT: Head and neck atraumatic. Conjunctivae pink. Sclera anicteric. Oral mucosa moist. Hearing intact.   NECK: No JVD. No lymph nodes. No stiffness.   PULMONARY: Lungs are bilaterally clear and equal air entry. No crackles or rhonchi heard.   CARDIOVASCULAR: S1 and S2 present, regular. No murmur. No edema of the legs.   ABDOMEN: Soft and nontender. Bowel sounds present. No organomegaly felt.   EXTREMITIES: Right leg and foot with cast present.  I did not remove the cast as it is chronic, without any changes, no complaints, and he goes to his doctor and follows up to get it changed every week. Left leg - no edema present. No skin changes or rashes. Joints - no tenderness.   NEUROLOGICAL: No tremor. Follows commands. No gross sensory or motor abnormalities.   PSYCHIATRIC: Fully alert and oriented x3, cooperative with examination. Does not appear in any acute psychiatric disturbance.  LABORATORY, DIAGNOSTIC AND RADIOLOGIC DATA: Glucose 216, BUN 17, creatinine 1.39, sodium 137, potassium 5.3, chloride 105, CO2 25, calcium 8.3, total protein 6.8, albumin 3.2, bilirubin 3.2, alkaline phosphate 152, SGOT 20, and SGPT 31. CK total 58. CK-MB 1.1. Troponin I less than 0.02. WBC 9.9, RBC 3.1, hemoglobin 12.5, platelet count 85, and mean corpuscular volume 112.  Chest x-ray: No acute cardiopulmonary disease.   EKG: Normal sinus rhythm, rate around 90 beats per minute, intraventricular conduction delay,  and some ST-T changes with ST depression is noted in leads I, II, V5 and V6 which appears to be similar to an EKG in the past; I reviewed the EKG from April 2013.   ASSESSMENT: A 74 year old male with coronary artery disease, congestive heart failure, diabetes mellitus, chronic leg ulcer, chronic kidney disease stage III, and HIV who came to the Emergency Room after having three days of azithromycin taken for his congestive symptoms having tightness in the chest, nausea and burning, retrosternal.   PLAN:  1. For tightness in the chest and burning type of pain associated with nausea, as he has a strong cardiac history with coronary artery disease, decreased ejection fraction, congestive heart failure, and some interventricular conduction delay and ST-T changes, same as before, I would like to monitor him on telemetry for observation for two more troponins and cardiology consults meanwhile to decide any need for further work-up, although it is a less likely he is having any cardiac event at this time. We will continue his cardiac medications as aspirin, Plavix, and Carvedilol. We will not give any ACE inhibitor because he has chronic renal failure and his potassium has been elevated multiple times in the past and on this presentation also.  Continue Imdur and we will start him on a statin as he was not taking at home.  2. Second possibility which can explain his symptoms of having chest tightness, nausea, and burning epigastric and retrosternal pain, is possibly due to gastritis secondary to his antibiotic use, azithromycin, for last three days. We will give him IV PPI and Zofran for his nausea and give him diet as tolerated and give him some IV fluids as he has this nauseous feeling and he says that he was not drinking or eating good enough.  3. Elevated bilirubin and mild elevation of alkaline phosphatase. I confirmed with the patient and from the previous labs this is a chronic problem and he attributes it to  his medications. There is no complaint regarding that and he is following with his doctor and he is well aware about these things, so we will not do any intervention about that in this admission.  4. Chronic kidney disease, stage III. This is a chronic problem with him and right now he is not in any signs of fluid overload or worsening of his renal failure so we will not address this issue at this time.  5. Hyperkalemia. Potassium is 5.3. We will give Kayexalate and follow in the afternoon for potassium and not give any ACE inhibitor because of this issue.  6. Chronic congestive heart failure, systolic failure, ejection fraction 25%. His echocardiogram is available in the past. Currently he has no signs or symptoms of heart failure so we will just continue him on his home medications. We are getting cardiology consult anyway to decide any need for further cardiac work-up. If he feels then we might get echocardiogram, but most probably there is no need for that during this admission.  7. HIV. We will continue his home medications and he is taking since last 20 years.  8. Leg ulcer. There is a cast present on the leg and currently no pain or any issues with that. He is chronically following with his doctor and surgeons and planning to do surgery within a few months, so we will not address this issue in this admission.  9. Diabetes mellitus. We will continue home medications as Januvia and glimepiride and we will give him insulin sliding scale.  10. Possible bronchitis or viral sinusitis. As he finished three days of azithromycin, we will give him two more days IV just to finish the course.   Condition, lab findings, possibilities and plan were discussed with the patient and his wife present in the room. Currently the patient's code status is FULL CODE.  TOTAL TIME SPENT: 50 minutes. ____________________________ Ceasar Lund Anselm Jungling, MD vgv:slb D: 07/30/2012 10:58:49 ET T: 07/30/2012 11:54:00  ET JOB#: [id number]  cc: Ceasar Lund. Anselm Jungling, MD, <Dictator> Cletis Athens, MD Isaias Cowman, MD Vaughan Basta MD ELECTRONICALLY SIGNED 08/10/2012 18:13

## 2015-02-08 DIAGNOSIS — B182 Chronic viral hepatitis C: Secondary | ICD-10-CM | POA: Diagnosis not present

## 2015-02-08 DIAGNOSIS — E11621 Type 2 diabetes mellitus with foot ulcer: Secondary | ICD-10-CM | POA: Diagnosis not present

## 2015-02-08 DIAGNOSIS — S81801A Unspecified open wound, right lower leg, initial encounter: Secondary | ICD-10-CM | POA: Diagnosis not present

## 2015-02-08 DIAGNOSIS — I87311 Chronic venous hypertension (idiopathic) with ulcer of right lower extremity: Secondary | ICD-10-CM | POA: Diagnosis not present

## 2015-02-08 DIAGNOSIS — L97311 Non-pressure chronic ulcer of right ankle limited to breakdown of skin: Secondary | ICD-10-CM | POA: Diagnosis not present

## 2015-02-08 DIAGNOSIS — B20 Human immunodeficiency virus [HIV] disease: Secondary | ICD-10-CM | POA: Diagnosis not present

## 2015-02-09 NOTE — Op Note (Signed)
PATIENT NAME:  Robert Bentley, Robert Bentley MR#:  [id number] DATE OF BIRTH:  [birth year]  DATE OF PROCEDURE:  11/16/2012  PREOPERATIVE DIAGNOSIS: Abscess of neck.    POSTOPERATIVE DIAGNOSIS: Abscess of neck.  PROCEDURE PERFORMED: Incision and drainage.   SURGEON: Rodena Goldmann, MD  ASSISTANT: [place], [place] Student   ANESTHESIA: Monitored anesthetic care.  DESCRIPTION OF PROCEDURE: With the patient in the right side up position and after appropriate induction of intravenous sedation the patient's neck was prepped with Betadine and draped in sterile towels. 0.5% Marcaine was used for anesthesia. The area was incised and drained. A small amount of purulent fluid was removed. No culture was performed as it had been cultured previously. Two counter incisions were made upon opening the abscess cavity and a small Penrose drain placed. The drain was secured with 3-0 nylon. Sterile dressings were applied. The patient was returned to the recovery room having tolerated the procedure well. Sponge, instrument and needle counts were correct x 2, in the operating room. ____________________________ Rodena Goldmann III, MD rle:sb D: 11/16/2012 13:19:12 ET T: 11/16/2012 13:33:58 ET JOB#: [id number]  cc: Rodena Goldmann III, MD, <Dictator> Rodena Goldmann MD ELECTRONICALLY SIGNED 11/17/2012 6:08

## 2015-02-10 NOTE — H&P (Signed)
PATIENT NAME:  Robert Bentley, BRUNKHORST MR#:  [id number] DATE OF BIRTH:  1941-10-04  DATE OF ADMISSION:  01/18/2014  REFERRING PHYSICIAN: Loney Hering, MD  PRIMARY CARE PHYSICIAN: Cletis Athens, MD  CARDIOLOGIST: Isaias Cowman, MD  INFECTIOUS DISEASE PHYSICIAN: [facility], Toni Amend, MD.  HEPATOLOGY: [facility], Roslynn Amble, MD.  CHIEF COMPLAINT: Shortness of breath.   HISTORY OF PRESENT ILLNESS: This is a 74 year old male with known history of congestive heart failure, coronary artery disease with ischemic cardiomyopathy, ejection fraction of 25%, chronic atrial fibrillation, diabetes as well as a history of HIV and hepatitis acquired after blood transfusion after cholecystectomy surgery, as per the patient. The patient reports he has been following with his infectious disease doctor and taking his medication regularly. Reports he has undetectable viral load, and he reports he has good CD4 count above 600. The patient presents with complaints of shortness of breath and worsening lower extremity edema. The patient was recently in the hospital on February 20th, where he was admitted for acute systolic congestive heart failure, where he was on IV diuresis. The patient presents with complaints of shortness of breath. Reports his symptoms have been going on for a few weeks, but have much worsened over the last few days. As well, he reports worsening lower extremity edema as well. Reports he has been seen by Dr. Holley Raring, where he had his Lasix doubled over the last week for a total of 7 days, then he was back on his daily dose, but when he followed up with him, his Lasix again was increased to 40 b.i.d. Upon presentation, the patient had shortness of breath where he required oxygen. The patient reports he is wearing 2 liters oxygen at baseline at home. He is having elevated BNP level at 17,000, and his chest x-ray was showing interval development of dense right basilar  opacity favored to reflect a layering pleural effusion and atelectasis, but superimposed infiltrate could not be ruled out. As well, the patient was found to be in atrial fibrillation with heart rate in the 130s. As well, he had elevated troponin at 0.13. The patient denies any chest pain. His EKG did not have any significant changes from previous. Hospitalists were requested to admit the patient for further treatment of his CHF and atrial fibrillation with RVR.   PAST MEDICAL HISTORY:  1. History of coronary artery disease status post stenting.  2. Ischemic cardiomyopathy with ejection fraction 25%.  3. Chronic systolic CHF.  4. Chronic atrial fibrillation.  5. Frequent PVCs.  6. Diabetes.  7. History of HIV and hepatitis C, which he reports it occurred after he received a blood transfusion status post cholecystectomy surgery.   ALLERGIES:  1. MORPHINE.  2. STATIN.   SOCIAL HISTORY: The patient is married, lives with his wife. He is retired from [employer] He quit smoking in 1977.   FAMILY HISTORY: Mother died at the age of 37 from congestive heart failure.   HOME MEDICATIONS:  1. Aspirin 81 mg oral daily.  2. Sodium bicarbonate 650 mg oral 2 tablets 2 times a day.  3. Imdur 30 mg oral daily.  4. Glimepiride 2 mg oral 3 times a day.  5. Januvia 50 mg oral daily.  6. Plavix 75 mg oral daily.  7. Combivir 150/100 oral 2 times a day.  8. Norvir 100 mg oral daily.  9. Reyataz 300 mg oral daily. 10. Ambien 5 mg oral at bedtime.  11. Xanax 0.5 mg oral every  8 hours as needed.  12. Coreg 6.25 mg oral daily.  13. Lasix 40 mg oral daily.  14. Lumigan 0.01 ophthalmic solution 1 drop each affected eye in the evening.  15. Vicodin 10/300 every 6 hours as needed for pain.   REVIEW OF SYSTEMS:  CONSTITUTIONAL: Denies fever, chills. Complains of fatigue, weakness. Reports weight gain, mainly fluid.  EYES: Denies blurry vision, double vision, inflammation, glaucoma.  ENT:  Denies tinnitus, ear pain, hearing loss, epistaxis or discharge.  RESPIRATORY: Denies any cough, any wheezing, any hemoptysis. Reports shortness of breath.  CARDIOVASCULAR: Denies chest pain, arrhythmia, syncope. Reports some palpitations and worsening edema.  GASTROINTESTINAL: Denies nausea, vomiting, diarrhea, abdominal pain, hematemesis, melena.  GENITOURINARY: Denies dysuria, hematuria, renal colic.  ENDOCRINE: Denies polyuria or polydipsia, heat or cold intolerance.  HEMATOLOGY: Denies anemia, easy bruising, bleeding diathesis.  INTEGUMENTARY: Denies acne, rash or skin lesion.  MUSCULOSKELETAL: Denies any arthritis, cramps, swelling, gout.  NEUROLOGIC: Denies any history of CVA, TIA, ataxia, vertigo, tremors.  PSYCHIATRIC: Denies any anxiety, any depression, any schizophrenia. Reports history of posttraumatic stress disorder.   PHYSICAL EXAMINATION:  VITAL SIGNS: Temperature 97.6, pulse 104, respiratory rate 18, blood pressure 116/84, saturating 100% on oxygen.  GENERAL: Elderly male, looks comfortable, in no apparent distress.  HEENT: Head atraumatic, normocephalic. Pupils equally reactive to light. Pink conjunctivae. Mildly icteric sclerae. Moist oral mucosa.    ____________________________ Robert Patricia, MD dse:lb D: 01/18/2014 05:54:42 ET T: 01/18/2014 06:37:30 ET JOB#: [id number]  cc: Robert Patricia, MD, <Dictator> Miamarie Moll Graciela Husbands MD ELECTRONICALLY SIGNED 01/21/2014 12:37

## 2015-02-10 NOTE — Discharge Summary (Signed)
PATIENT NAME:  Robert Bentley, Robert Bentley MR#:  [id number] DATE OF BIRTH:  [birth year]  DATE OF ADMISSION:  11/08/2013 DATE OF DISCHARGE:  11/10/2013  PRIMARY CARE PHYSICIAN: Cletis Athens, MD  FINAL DIAGNOSES: 1.  Acute on chronic renal failure. 2.  Hyperkalemia.  3.  Chronic systolic congestive heart failure.  4.  Coronary artery disease.  5.  Ischemic cardiomyopathy.  6.  Diabetes.   MEDICATIONS: None.   DISCHARGE MEDICATIONS:  1.  Aspirin 81 mg daily.  2.  Clopidogrel 75 mg daily. 3.  Imdur 60 mg daily. 4.  Carvedilol 12.5 mg daily. 5.  Glimepiride 2 mg t.i.d.    7.  Januvia 50 mg daily. 8.  Norvir 100 mg daily. 9.  Reyataz 300 mg daily. 10.  Vicodin 1 to 2 tabs q. 4 hours p.r.n. 11.  Excedrin 2 tabs q. 6 p.r.n.   HISTORY OF PRESENT ILLNESS: Please see admission H and [place] COURSE: The patient was admitted on 11/08/2013 with chief complaint of generalized weakness, mild nausea, vomiting, and diarrhea. Lab work revealed acute on chronic renal failure with elevated BUN and creatinine of 55 and 2.6, respectively. Potassium was 7.6. The patient was treated with calcium gluconate, started on normal saline intravenous fluid repletion. The patient was evaluated by Dr. Anthonette Legato. Renal ultrasound was performed, which was negative for hydronephrosis. The patient was also treated with Kayexalate. Followup BUN creatinine were 51 and 2.36, respectively. Follow-up potassium was 5.3. The patient had an uncomplicated hospital course. Following intravenous fluid repletion the patient demonstrated clinical improvement with improved overall weakness. He denied any chest pain or shortness of breath. On the morning of 11/10/2013, the patient was ambulating without difficulty. Followup MET B is pending at time of dictation. The patient will be discharged home later today with followup with myself in 1 to 2 weeks. The patient also will have a scheduled followup with Dr. Holley Raring for continued outpatient  management of chronic kidney disease.  ____________________________ Isaias Cowman, MD ap:sb D: 11/10/2013 08:05:05 ET T: 11/10/2013 08:14:13 ET JOB#: [id number]  cc: Isaias Cowman, MD, <Dictator> Isaias Cowman MD ELECTRONICALLY SIGNED 12/06/2013 12:30

## 2015-02-10 NOTE — Consult Note (Signed)
Patient's platelet count is 45 today. Continue to monitor daily CBC. Patient's INR is therapeutic, therefore continue current dose of Coumadin. If platelet count continues to drop, would consider IVC filter at that time. follow.  Electronic Signatures: Delight Hoh (MD)  (Signed on 10-Apr-15 12:25)  Authored  Last Updated: 10-Apr-15 12:25 by Delight Hoh (MD)

## 2015-02-10 NOTE — Consult Note (Signed)
PATIENT NAME:  Robert Bentley, Robert Bentley MR#:  [id number] DATE OF BIRTH:  [birth year]  DATE OF CONSULTATION:  01/24/2014  REFERRING PHYSICIAN:  Bettey Costa, MD CONSULTING PHYSICIAN:  Cheral Marker. Ola Spurr, MD  REASON FOR CONSULTATION: HIV and possible pneumonia.   HISTORY OF PRESENT ILLNESS: This is a very pleasant 74 year old gentleman with history of HIV and hepatitis C, both well controlled, who was admitted on April 1st with increasing congestive heart failure, atrial fibrillation and renal failure. Since that time, he has been attempted to be diuresed, but has had some worsening renal function. We are consulted for further evaluation for possible pneumonia.   The patient reports he has not had any fevers or chills. He has a mild cough that is nonproductive. He does report having a history of PCP approximately 8 years ago.   PAST MEDICAL HISTORY: 1.  HIV, diagnosed many years ago at [facility] Apparently was from a transfusion when he had gallbladder surgery.  2.  Hepatitis C.  3.  Congestive heart failure, follows with Dr. Saralyn Pilar. 4.  Atrial fibrillation.  5.  Diabetes.  6.  Coronary artery disease status post stenting.   PAST SURGICAL HISTORY: Status post cholecystectomy.   SOCIAL HISTORY: The patient is married, lives with his wife. He is retired from the [employer], quite smoking in the distant past.   FAMILY HISTORY: Positive for CHF in his mother.  ALLERGIES: MORPHINE, STATIN.  ANTIBIOTICS SINCE ADMISSION: Include  Cefepime, April 2nd through the 6th, levofloxacin April 2nd, 4th and 6th.   ANTIRETROVIRALS: Include Combivir, atazanavir, and ritonavir.  REVIEW OF SYSTEMS: Eleven 11 systems reviewed and negative except as per HPI.  PHYSICAL EXAMINATION: VITAL SIGNS: Temperature 98, pulse 95, blood pressure 118/76, respirations 20, sat 97% on 2 liters.  GENERAL: He has been in some respiratory distress, lying in bed.  HEENT: Pupils equal, round and reactive to light and  accommodation. Extraocular movements are intact. Sclerae anicteric. Oropharynx is clear.  NECK: Supple.  HEART: Sounds are quite distant and irregular.  LUNGS: Bronchial breath sounds on the right with dullness to percussion about half way up on the right. Left side is clear.  ABDOMEN: Mildly distended, soft, nontender.  EXTREMITIES: He has 3+ edema to his mid shin, 2+ to his knees. He also has sacral edema.  NEUROLOGIC: He is alert and oriented x3. Grossly nonfocal neuro exam.   DIAGNOSTIC DATA: X-rays done April 4th showed right-sided pleural effusion.   Chest x-ray on April 3rd again showed persistent moderate right-sided effusion and adjacent consolidation and atelectasis.   Chest x-ray on admission, April 1st, showed interval development of dense right basilar opacity favored to reflect a layering pleural effusion and atelectasis. There is stable cardiomegaly.  White blood count on admission was 5.7, currently it is 5.2, hemoglobin 12.4, platelets 119,000 up from 75 on admission. Troponins are negative. Renal function shows a creatinine of 3.03, BUN 62. BNP on admission was 17,118. LFTs done April 2nd showed low albumin at 2.8 and an increased bilirubin at 3.3. This could be from his Reyataz, otherwise stable.   Lower extremity Doppler does show nonocclusive DVT.   VQ scan was negative for likely PE.   Ultrasound of his abdomen revealed mild ascites, but otherwise normal.   IMPRESSION: A 74 year old with well-controlled human immunodeficiency virus, per report, with a CD4 over 600 and viral load suppressed when last checked 6 months ago. He is on a stable human immunodeficiency virus regimen. He was admitted mainly with volume overload  and has a large right pleural effusion.   I do not suspect any active infection at this time. With a normal CD4 count he is at very low risk of pneumocystis pneumonia. I also do not see evidence of an active bacterial process since what is in his right  lung base is likely all infiltrate.   RECOMMENDATIONS:  1.  I would stop his levofloxacin and cefepime.  2.  Continue antiretrovirals. His mild increase in his bilirubin is likely a nonserious side effect of the Reyataz and is nothing to worry about. If there is concern about consolidation as opposed to just effusion on the right you could consider a noncontrasted CT of the chest.  3.  He may benefit from thoracentesis to improve pulmonary function, but would defer to cardiology for this.   Thank you for the consult. I will be glad to follow with you.  ____________________________ Cheral Marker. Ola Spurr, MD dpf:sb D: 01/24/2014 16:13:51 ET T: 01/24/2014 17:06:33 ET JOB#: [id number]  cc: Cheral Marker. Ola Spurr, MD, <Dictator> Meade Hogeland Ola Spurr MD ELECTRONICALLY SIGNED 01/31/2014 22:17

## 2015-02-10 NOTE — Consult Note (Signed)
Chief Complaint:  Subjective/Chief Complaint Pt c/o of fatigue tired weakness. Denies pain sob ok Palipitaions ok.   VITAL SIGNS/ANCILLARY NOTES: **Vital Signs.:   22-Feb-15 14:04  Vital Signs Type Recheck  Temperature Temperature (F) 97.9  Celsius 36.6  Temperature Source oral  Pulse Pulse 71  Respirations Respirations 18  Systolic BP Systolic BP 631  Diastolic BP (mmHg) Diastolic BP (mmHg) 68  Mean BP 82  Pulse Ox % Pulse Ox % 93  Pulse Ox Activity Level  At rest  Oxygen Delivery Room Air/ 21 %  *Intake and Output.:   Daily 22-Feb-15 07:00  Grand Totals Intake:  205 Output:      Net:  205 24 Hr.:  205  Oral Intake      In:  180  IV (Primary)      In:  25  Length of Stay Totals Intake:  490 Output:      Net:  490   Brief Assessment:  GEN well developed, well nourished, no acute distress   Cardiac Irregular  murmur present  + LE edema  --Gallop   Respiratory normal resp effort   Gastrointestinal Normal   Gastrointestinal details normal Soft   EXTR positive edema   Lab Results: Routine Chem:  22-Feb-15 04:56   Glucose, Serum 69  BUN  41  Creatinine (comp)  2.43  Sodium, Serum  135  Potassium, Serum  5.2  Chloride, Serum  111  CO2, Serum  17  Calcium (Total), Serum  7.8  Anion Gap 7  Osmolality (calc) 279  eGFR (African American)  30  eGFR (Non-African American)  26 (eGFR values <59m/min/1.73 m2 may be an indication of chronic kidney disease (CKD). Calculated eGFR is useful in patients with stable renal function. The eGFR calculation will not be reliable in acutely ill patients when serum creatinine is changing rapidly. It is not useful in  patients on dialysis. The eGFR calculation may not be applicable to patients at the low and high extremes of body sizes, pregnant women, and vegetarians.)  Routine Hem:  22-Feb-15 04:56   WBC (CBC) 5.0  RBC (CBC)  3.00  Hemoglobin (CBC)  11.5  Hematocrit (CBC)  34.9  Platelet Count (CBC)  53  MCV  116  MCH   38.5  MCHC 33.1  RDW 13.9  Neutrophil % 67.2  Lymphocyte % 15.7  Monocyte % 14.4  Eosinophil % 2.4  Basophil % 0.3  Neutrophil # 3.4  Lymphocyte #  0.8  Monocyte # 0.7  Eosinophil # 0.1  Basophil # 0.0 (Result(s) reported on 11 Dec 2013 at 05:45AM.)   Radiology Results: XRay:    20-Feb-15 18:46, Chest Portable Single View  Chest Portable Single View   REASON FOR EXAM:    shortness of breath, fluid retention  COMMENTS:       PROCEDURE: DXR - DXR PORTABLE CHEST SINGLE VIEW  - Dec 09 2013  6:46PM     CLINICAL DATA:  Chest pain.  Shortness of breath.  Fluid retention.    EXAM:  PORTABLE CHEST - 1 VIEW    COMPARISON:  DG CHEST 2V dated 08/14/2012; DG CHEST 1V PORT dated  07/05/2011    FINDINGS:  Suboptimal inspiration accounts for crowded bronchovascular  markings, especially in the bases, and accentuates the cardiac  silhouette. Taking this into account, cardiac silhouette mildly  enlarged but stable when compared to the prior portable examination  in 2012. Mild pulmonary venous hypertension without overt edema.  Lungs clear. Bronchovascular markings normal. No  visible pleural  effusions. No pneumothorax.     IMPRESSION:  Suboptimal inspiration. Stable cardiomegaly. Pulmonary venous  hypertension without overt edema. No acute cardiopulmonary disease.      Electronically Signed    By: Evangeline Dakin M.D.    On: 12/09/2013 19:18     Verified By: Deniece Portela, M.D.,    21-Feb-15 06:12, Chest Portable Single View  Chest Portable Single View   REASON FOR EXAM:    recent onset cough  COMMENTS:       PROCEDURE: DXR - DXR PORTABLE CHEST SINGLE VIEW  - Dec 10 2013  6:12AM     CLINICAL DATA:  Cough, shortness of breath    EXAM:  PORTABLE CHEST - 1 VIEW    COMPARISON:  12/09/2013    FINDINGS:  Mild cardiomegaly with minor central vascular congestion and basilar  atelectasis. No current CHF or focal pneumonia. Negative for  effusion or pneumothorax. Trachea  is midline. Atherosclerosis of the  aorta.     IMPRESSION:  Cardiomegaly with vascularcongestion.  Stable exam.      Electronically Signed    By: Daryll Brod M.D.    On: 12/10/2013 08:06         Verified By: Earl Gala, M.D.,  Cardiology:    21-Feb-15 13:03, Echo Doppler  Echo Doppler   REASON FOR EXAM:      COMMENTS:       PROCEDURE: [facility] - ECHO DOPPLER COMPLETE(TRANSTHOR)  - Dec 10 2013  1:03PM     RESULT: Echocardiogram Report    Patient Name:   Robert Bentley Date of Exam: 12/10/2013  Medical Rec #:  [id number]              Custom1:  Date of Birth:  1941-10-13           Height:       73.0 in  Patient Age:    74 years            Weight:       145.0 lb  Patient Gender: M                   BSA:          1.88 m??    Indications: Atrial Fib  Sonographer:    Janalee Dane RCS  Referring Phys: Dustin Flock, H    Summary:   1. Left ventricular ejection fraction, by visual estimation, is 25 to   30%.   2. Severely decreased global left ventricular systolic function.   3. Moderately increased left ventricular internal cavity size.   4. Moderately enlarged right ventricle.   5. Moderately reduced RV systolic function.   6. Moderately dilated left atrium.   7. Moderately dilated right atrium.   8. Moderate mitral valve regurgitation.   9. Mild aortic valve sclerosis without stenosis.  10. Moderately elevated pulmonary artery systolic pressure.  11. Mild tricuspid regurgitation.  2D AND M-MODE MEASUREMENTS (normal ranges within parentheses):  Left Ventricle:          Normal  IVSd (2D):      0.84 cm (0.7-1.1)  LVPWd (2D): 0.83 cm (0.7-1.1) Aorta/LA:                  Normal  LVIDd (2D):     5.58 cm (3.4-5.7) Aortic Root (2D): 3.95 cm (2.4-3.7)  LVIDs (2D):     4.88 cm           Left Atrium (  2D): 5.00 cm (1.9-4.0)  LV FS (2D):     12.5 %   (>25%)  LV EF (2D):     26.7 %   (>50%)                                    Right Ventricle:                                     RVd (2D):        5.55 cm  SPECTRAL DOPPLER ANALYSIS (where applicable):  Tricuspid Valve and PA/RV Systolic Pressure: TR Max Velocity: 2.87 m/s RA   Pressure: 20 mmHg RVSP/PASP: 52.9 mmHg    PHYSICIAN INTERPRETATION:  Left Ventricle: The left ventricular internal cavity size was moderately   increased. LV septal wall thickness was normal. LV posterior wall   thickness was normal. Global LV systolic function was severely decreased.   Left ventricular ejection fraction, by visual estimation, is 25 to 30%.  Right Ventricle: The right ventricular size is moderately enlarged.   Global RV systolic function is moderately reduced.  Left Atrium: The left atrium is moderately dilated.  Right Atrium: The right atrium is moderately dilated.  Pericardium: There is no evidence of pericardial effusion.  Mitral Valve: The mitral valve is normal in structure. Moderate mitral   valve regurgitation is seen.  Tricuspid Valve: The tricuspid valve is normal. Mild tricuspid   regurgitation is visualized. The tricuspid regurgitant velocity is 2.87   m/s, and with an assumed right atrial pressure of 20 mmHg, the estimated   right ventricular systolic pressure is moderately elevated at 52.9 mmHg.  Aortic Valve: The aortic valve is normal. Mild aortic valve sclerosis is   present, with no evidence of aortic valve stenosis. No evidence of aortic   valve regurgitation is seen.  Pulmonic Valve: The pulmonic valve is normal.    Morning Glory MD  Electronically signed by Domino MD  Signature Date/Time: 12/10/2013/3:55:13 PM    *** Final ***    IMPRESSION: .        Verified By: Yolonda Kida, M.D., MD   Assessment/Plan:  Assessment/Plan:  Assessment IMP AFIB/Aflut CM CHF Hyperk+ HTN CRI CAD DM .   Plan tPLAN Agree with transfer to tele F/U HyperK+ Consider Nephology input Agree with HTN control CHF control appears to be compensated Anticoug for AFIB ContinueDM  Control Increase activity Consider PT/OT-rehab I do not rec cardiac cath   Electronic Signatures: Yolonda Kida (MD)  (Signed 22-Feb-15 18:03)  Authored: Chief Complaint, VITAL SIGNS/ANCILLARY NOTES, Brief Assessment, Lab Results, Radiology Results, Assessment/Plan   Last Updated: 22-Feb-15 18:03 by Lujean Amel D (MD)

## 2015-02-10 NOTE — H&P (Signed)
PATIENT NAME:  Robert Bentley, Robert Bentley MR#:  [id number] DATE OF BIRTH:  16-Apr-1941  DATE OF ADMISSION:  01/18/2014  ADDENDUM.  This is a continuation.   CONTINUE ON PHYSICAL EXAMINATION:  NECK:  Supple.  No thyromegaly.  No JVD.  CHEST:  Good air entry bilaterally.  No wheezing or rales.  Had decreased air entry in the right lung.  This is most likely related to pleural effusion.  CARDIOVASCULAR:  S1 and S2 heard.  Irregular regular.  Tachycardic.  ABDOMEN:  Obese, has ascitic wave, shifting dullness.  Bowel sounds present.  EXTREMITIES:  +2 edema bilaterally, on the right lower extremity has medial right ankle ulceration.  Pedal pulses felt bilaterally, radial pulses +2 bilaterally.  PSYCHIATRIC:  Appropriate affect.  Awake, alert x 3.  Intact judgment and insight.  NEUROLOGIC:  Cranial nerves grossly intact.  Motor five out of five.  No focal deficits.  SKIN:  Normal skin turgor.  Warm and dry.  MUSCULOSKELETAL:  No joint effusion or erythema could be appreciated.   PERTINENT LABORATORY DATA:  Glucose 153.  BNP 17,118, BUN 58, creatinine 2.73, sodium 137, potassium 5, chloride 104, CO2 28.  Troponin 0.13.  White blood cells 5.7, hemoglobin 12.6, hematocrit 39.5, platelets 75.  Chest x-ray, interval development of a dense right basilar opacity favored to reflect  pleural effusion and atelectasis, superimposed infiltrate is difficult to exclude radiographically.  Stable cardiomegaly and pulmonary vascular congestion without edema.   ASSESSMENT AND PLAN: 1.  Acute systolic congestive heart failure, the patient is known to have history of ischemic cardiomyopathy with an ejection fraction of 25%, we will consult the patient's cardiologist, Dr. Saralyn Pilar who is very familiar with the patient, meanwhile, we will start the patient on intravenous diuresis, but we will be very careful as his blood pressure is borderline and his kidney function is elevated as well, we will start him on IV Lasix 40 mg every 12  hour.  We will give him a total of three doses and then will re-evaluate prior to continuing that, as well, we will cycle his cardiac enzymes, follow the trend, and we will continue him on Coreg.  Certainly the patient is not a candidate for ACE inhibitor or ARB given the fact his chronic kidney disease, we thought about adding Aldactone, but by doing the patient's blood work in the past it seems he is always having hyperkalemia, so I would not start him on Aldactone at this point, and most likely the patient's atrial fibrillation with rapid ventricular response contributing to his systolic congestive heart failure.  We will have him on daily weights, strict ins and outs and fluid restrictions.  2.  Atrial fibrillation with rapid ventricular rhythm, given the patient's borderline blood pressure he was given digoxin in the ED which achieved good control which drops his heart rate from the 130s to the low 100s at this point, we will continue to monitor, we will resume him back on his Coreg, if needed we will consider intravenous as needed Cardizem pushes if blood pressure allows.  3.  Chronic kidney disease, appears to be at baseline, but we will consult nephrology as the patient will be on aggressive diuresis and I would anticipate rise in his creatinine.  4.  Elevated troponin.  Denies any chest pain.  No EKG changes.  This is most likely due to demand ischemia from his atrial fibrillation with rapid ventricular response and systolic congestive heart failure, we will cycle cardiac enzymes and follow the trend.  The patient already given aspirin 324 mg in the Emergency Department.  5.  Thrombocytopenia, appears to be at baseline.  The patient had no evidence of bleed.  We will monitor closely.  This is most likely related to his hepatitis C liver disease.  6.  History of human immunodeficiency virus, the patient is following regularly at [facility], reports he has undetectable viral load with  good CD4 count, will be continued on his home antiretroviral medications.  7.  History of hepatitis C, reports he is following regularly with [facility], supposed to be started on treatment soon.  8.  Deep vein thrombosis prophylaxis, the patient will be kept on sequential compression device and subQ heparin every 12 hours.  We will monitor closely as he is having mild thrombocytopenia, but no evidence of bleed.  CODE STATUS:  THE PATIENT HAS A LIVING WILL.  THE WIFE IS HIS HEALTHCARE POWER OF ATTORNEY AND REPORTS HE IS A FULL CODE.   Total time spent on admission and patient care 55 minutes.     ____________________________ [name], MD dse:ea D: 01/18/2014 06:08:51 ET T: 01/18/2014 06:33:55 ET JOB#: [id number]  cc: [name], MD, <Dictator> Norman Bier Graciela Husbands MD ELECTRONICALLY SIGNED 01/21/2014 12:37

## 2015-02-10 NOTE — Consult Note (Signed)
PATIENT NAME:  Robert Bentley, Robert Bentley MR#:  [id number] DATE OF BIRTH:  27-Oct-1940  DATE OF CONSULTATION:  12/10/2013  PRIMARY CARE PHYSICIAN: Dr. Lavera Guise  CARDIOLOGIST: Dr. Saralyn Pilar.  CONSULTING PHYSICIAN:  Leonila Speranza D. Annabell Oconnor, MD  INDICATION FOR ADMISSION: Shortness of breath and palpitations.   HISTORY OF PRESENT ILLNESS: The patient is a 74 year old white male with history of multiple medical problems recently admitted in January. The patient was admitted with acute on chronic renal failure, hyperkalemia. The patient was discharged home. Comes back today with diarrhea for at least today a day then short of breath when she had a fast heart rate. The patient came to ER and thought to be in tachycardia, rate of about 140, appears to probably be atrial flutter with a wide complex similar to his baseline. He has known cardiomyopathy. He was being evaluated for an AICD. We treated him with metoprolol and digoxin, which seemed to help his rate. He has had a history of PTSD. Again was seen by but had a leg ulcer requiring skin grafts. His AICD had to be postponed until the infection of his leg was treated. Now he is here with recurrent tachycardia.   PAST MEDICAL HISTORY: Myocardial infarction, PCI, ischemic cardiomyopathy, chronic systolic dysfunction, atrial fibrillation,  , diabetes, HIV, hepatitis C.     CURRENT MEDICATIONS: Aspirin 81, carvedilol 12.5, Plavix 75,  Imdur 60, glimepiride 2 t.i.d., Januvia 50,  Norvir 100 daily. Reyataz 300 daily.   ALLERGIES: MORPHINE.   SOCIAL HISTORY: Married, Retired, Education officer, museum. No smoking since 1977.  FAMILY HISTORY: Mother died at 2 of congestive heart failure.  REVIEW OF SYSTEMS:  . No nausea, vomiting, fever, chills or sweats. He has had some mild diarrhea. He recently had some acute shortness of breath, atrial fibrillation, PTSD.   PHYSICAL EXAMINATION: VITAL SIGNS: Blood pressure was 130/85, pulse 100 and irregular, respiratory rate 18,  afebrile.  HEENT: Normocephalic, atraumatic. Pupils equal and reactive to light.  NECK: Supple. No significant JVD, bruits or adenopathy.  LUNGS: Clear to auscultation and percussion. No significant wheeze, rhonchi, or rale.  HEART EXAM: Irregularly irregular. Systolic ejection murmur left sternal border. Positive S3.  ABDOMEN: Benign.  EXTREMITIES: Has skin graft on his lower leg, slight edema. Adequate pulses.  NEUROLOGIC: Intact.  SKIN: Normal.   LABORATORIES: Chest x-ray is suboptimal inspiration. Pulmonary vascular congestion. Glucose 90, BUN 34, creatinine 2.0, sodium 136, potassium 5.5, chloride 111, CO2 20, alkaline phosphatase 154. LFTs negative. White count 5.8, hemoglobin of 12.9, platelet count 68, BNP 13,000.    ASSESSMENT: Congestive heart failure, atrial fibrillation, atrial flutter with rapid rate, congestive heart failure, hyperkalemia, diabetes, known coronary artery disease, cardiomyopathy.   PLAN:  1. Agree with admit. Continue treatment for heart failure as well as cardiomyopathy to continue rate control with metoprolol and digoxin as necessary.  2. For hyperkalemia. Agree with Kayexalate. 3. For coronary artery disease continue current therapy.   4. For diabetes continue Januvia  and sliding scale.  5. Continue deep vein thrombosis prophylaxis.   Hopefully, we will be able to improve the patient's rate, rhythm and heart failure and have the patient follow up with Dr. Josefa Half as an outpatient and outpatient. Do not recommend any invasive cardiac studies at this point. Echocardiogram may be helpful.   ____________________________ Loran Senters. Clayborn Bigness, MD ddc:sg D: 12/11/2013 07:39:00 ET T: 12/11/2013 10:24:46 ET JOB#: [id number]  cc: Carson Meche D. Clayborn Bigness, MD, <Dictator> Yolonda Kida MD ELECTRONICALLY SIGNED 01/16/2014 7:02

## 2015-02-10 NOTE — Consult Note (Signed)
   Present Illness 74 yo male with history of multiple medical problems with histoyr of cad s/p pci, ischemic cardiomyopathy with aicd in place s/p cadiac arrest which is followed at [facility], history of paroxysmal afib, ckd with seerum creatinine of 2.9 who was admitted iwht some chest pain and weakness. Somewhat hypotensive. Felt to be mildy dehydrated. Ruled out fo rmi. Improved with gentle hydration. Complains of intermittant consipation.   Physical Exam:  GEN no acute distress   HEENT hearing intact to voice   NECK supple   RESP normal resp effort  clear BS   CARD Regular rate and rhythm  Murmur   Murmur Systolic   Systolic Murmur axilla   ABD denies tenderness  normal BS  no Abdominal Bruits   LYMPH negative neck   SKIN normal to palpation   NEURO cranial nerves intact, motor/sensory function intact   PSYCH A+O to time, place, person   Review of Systems:  Subjective/Chief Complaint weakness and cp. R/o for mi   General: Weakness   Skin: No Complaints   ENT: No Complaints   Eyes: No Complaints   Neck: No Complaints   Respiratory: No Complaints   Cardiovascular: brief chest pain . currently pain free   Gastrointestinal: Constipation   Genitourinary: No Complaints   Vascular: No Complaints   Musculoskeletal: No Complaints   Neurologic: No Complaints   Hematologic: No Complaints   Endocrine: No Complaints   Psychiatric: No Complaints   Review of Systems: All other systems were reviewed and found to be negative   Medications/Allergies Reviewed Medications/Allergies reviewed   Family & Social History:  Family and Social History:  Family History Non-Contributory   Social History negative tobacco, negative ETOH    Other- Explain in Comments Line: Unknown  Morphine: Unknown  Statins: Unknown   Impression Pt with histoyr of cad s/p pci, ischemic cardiomyopathy with aicd in place, intermitant afib, dm, chronic pain, who is anticoagulated  with warfarin for his afib and has rate control with amiodaonrre and carvedilol and diltiazem. On dual antiplatelet therapy for his cad, has hib on norivr and retroviral. Admitted with vague chest pain. Ruled out for mi. No evidence of chf at present.   Plan 1. Continue amiodarone, cardizem and warfarin for afib 2. Close follow up of aicd at [facility]. 3. Mirilax for constipation 4. Continue with carvedilol, furosemide for chf 5. Continue with plavix, asa, imdur for cad. 6. Continue with glimepride for dm 7. OK for discharge from cardiac standpoint. Follow up Dr. Saralyn Pilar next week.   Electronic Signatures: Teodoro Spray (MD)  (Signed 21-Jun-15 11:09)  Authored: General Aspect/Present Illness, History and Physical Exam, Review of System, Family & Social History, Allergies, Impression/Plan   Last Updated: 21-Jun-15 11:09 by Teodoro Spray (MD)

## 2015-02-10 NOTE — Consult Note (Signed)
Platelets unchanged.  Coumadin initiated with goal INR 2.0-3.0.  Abd U/S revealed normal spleen and liver.  Continue to monitor daily CBC.  follow.  Electronic Signatures: Delight Hoh (MD)  (Signed on 04-Apr-15 12:43)  Authored  Last Updated: 04-Apr-15 12:43 by Delight Hoh (MD)

## 2015-02-10 NOTE — H&P (Signed)
PATIENT NAME:  Robert Bentley, WEITKAMP MR#:  [id number] DATE OF BIRTH:  [birth year]  DATE OF ADMISSION:  11/08/2013  PRIMARY CARE PHYSICIAN:  Dr. Lavera Guise   CARDIOLOGIST:  Dr. Saralyn Pilar  CHIEF COMPLAINT: " I am weak."  HISTORY OF PRESENT ILLNESS: The patient is a 74 year old gentleman with known history of coronary artery disease, ischemic cardiomyopathy, chronic systolic congestive heart failure and atrial fibrillation. The patient reports that he was in his usual state of health until 11/06/2013 when he started experiencing progressive generalized weakness. The patient has had some mild fever and chills, experienced some nausea and vomiting this morning with mild diarrhea. The patient has had some mild chest discomfort, shortness of breath and occasional palpitations which are chronic in nature. The patient presented to the outpatient clinic. Lab work was performed, which revealed a BUN and creatinine of 55 and 2.6, respectively. Potassium was  7.6.   PAST MEDICAL HISTORY: 1.  Status post MI and direct PCI at [facility] 2003.  2.  Ischemic cardiomyopathy with LVEF of 25%.  3.  Chronic systolic congestive heart failure.  4.  Chronic atrial fibrillation.  5.  Frequent premature ventricular contractions.  6.  Diabetes.   MEDICATIONS: Aspirin 81 mg daily, metoprolol tartrate 50 mg daily p.r.n. for tachycardia, clopidogrel 75 mg daily, carvedilol 12.5 mg b.i.d., Imdur 60 mg daily, glyburide 6 mg t.i.d., Xanax 0.5 mg at bedtime p.r.n., Januvia 50 mg daily.   SOCIAL HISTORY: The patient is married. He is retired from [employer]. He quit tobacco use in 1977.   FAMILY HISTORY: Mother died at age 63 from congestive heart failure.   REVIEW OF SYSTEMS.  CONSTITUTIONAL: The patient had some mild fever and chills.  EYES: No blurry vision.  EARS: No hearing loss.  RESPIRATORY: The patient has had chronic exertional dyspnea.  CARDIOVASCULAR: The patient has had mild recurrent chest  pain.  GASTROINTESTINAL: The patient had nausea, vomiting and diarrhea this morning.  GENITOURINARY: No dysuria or hematuria.  ENDOCRINE: No polyuria or polydipsia.  MUSCULOSKELETAL: No arthralgias or myalgias.  NEUROLOGICAL: No focal muscle weakness or numbness.  PSYCHOLOGICAL: No depression or anxiety.   PHYSICAL EXAMINATION: VITAL SIGNS: Height 6 feet, 1 inch, BMI 25, blood pressure 124/84, pulse 54 and irregular.  HEENT: Pupils equal, reactive to light and accommodation.  NECK: Supple without thyromegaly.  LUNGS: Clear.  HEART: Normal JVP. Diffuse PMI. Irregularly irregular rhythm. Normal S1, S2. No appreciable gallop, murmur, or rub.  ABDOMEN: Soft and nontender.  EXTREMITIES: There is trace to 1+ bilateral pedal edema.  MUSCULOSKELETAL: Normal muscle tone.  NEUROLOGIC: The patient is alert and oriented x 3. Motor and sensory both grossly intact.   ACCESSORY DATA: EKG reveals atrial fibrillation at a rate of 77 BPM with left bundle branch block.   IMPRESSION: This is a 74 year old gentleman with known coronary artery disease, ischemic cardiomyopathy, chronic systolic congestive heart failure who presents with 1 to 2 day history of generalized weakness and found to have acute on chronic renal failure and hyperkalemia.   RECOMMENDATIONS: 1.  Admit to telemetry. 2.  IV hydration with normal saline 150 mL per hour. 3.  Renal ultrasound. 4.  Calcium gluconate 1 gram IV push slowly. 5.  Urgent renal consult, Dr. Holley Raring notified.    ____________________________ Isaias Cowman, MD ap:dp D: 11/08/2013 10:58:00 ET T: 11/08/2013 11:20:18 ET JOB#: [id number]  cc: Isaias Cowman, MD, <Dictator> Isaias Cowman MD ELECTRONICALLY SIGNED 12/06/2013 12:29

## 2015-02-10 NOTE — Consult Note (Signed)
PATIENT NAME:  Robert Bentley, Robert Bentley MR#:  [id number] DATE OF BIRTH:  06-03-1941  DATE OF CONSULTATION:    REFERRING PHYSICIAN:  Dr. Posey Pronto CONSULTING PHYSICIAN:  Corey Skains, MD  REASON FOR CONSULTATION:  Acute on chronic congestive heart failure with atrial fibrillation, chronic kidney disease, diabetes and coronary artery disease.   CHIEF COMPLAINT:  "I was short of breath."   HISTORY OF PRESENT ILLNESS:  This is a 74 year old male with known severe dilated cardiomyopathy with ejection fraction of 25% with chronic atrial fibrillation with controlled ventricular rate, chronic kidney disease, diabetes and coronary artery disease.  He has had new onset of weakness, fatigue and pain in his right leg for which has caused significant atrial fibrillation with rapid ventricular rate over a several day period with increase in frequency and intensity of shortness of breath and some mild amount of chest discomfort.  Currently, the patient has a troponin of 0.13 consistent with demand ischemia and a BNP of 17,118.  This is consistent with acute on chronic systolic dysfunction, congestive heart failure.  The patient does have chronic kidney disease also significantly causing current issues with diabetes and coronary artery disease.  He currently is stable with intravenous Lasix improving his symptoms at this time.   REVIEW OF SYSTEMS:  The remainder of his review of systems negative for vision change, ringing in ears, hearing loss, cough, congestion, heartburn, nausea, vomiting, diarrhea, bloody stools, stomach pain, extremity pain, leg weakness, cramping of the buttocks, known blood clots, headaches, blackouts, dizzy spells, nosebleeds, congestion, trouble swallowing, frequent urination, urination at night, muscle weakness, numbness, anxiety, depression, skin lesions, skin rashes.   PAST MEDICAL HISTORY:  1.  Chronic dilated cardiomyopathy.  2.  Atrial fibrillation.  3.  Chronic kidney disease.  4.   Diabetes.  5.  Coronary artery disease.   FAMILY HISTORY:  Multiple family members with early onset of cardiovascular disease or hypertension.   SOCIAL HISTORY:  Currently denies alcohol or tobacco use.   ALLERGIES:  AS LISTED.   MEDICATIONS:  As listed.   PHYSICAL EXAMINATION: VITAL SIGNS:  Blood pressure is 100/60 bilaterally, heart rate 70, upright, reclining, and slightly irregular.  GENERAL:  He is a well-appearing elderly male in no acute distress.  HEAD, EYES, EARS, NOSE, THROAT:  No icterus, thyromegaly, ulcers, hemorrhage, or xanthelasma.  CARDIOVASCULAR:  Irregularly irregular.  Normal S1 and S2.  2 to 3 out of 6 apical murmur consistent with mitral regurgitation.  PMI is enlarged and laterally displaced.  Carotid upstroke normal without bruit.  Jugular venous pressure is normal.  LUNGS:  Have bibasilar crackles with decreased breath sounds in the bases.  ABDOMEN:  Soft, nontender without hepatosplenomegaly or masses.  Abdominal aorta is normal size without bruit.  EXTREMITIES:  2+ bilateral pulses in dorsal, pedal, radial and femoral arteries with 1+ lower extremity edema.  No cyanosis, clubbing or ulcers.  NEUROLOGIC:  He is oriented to time, place, and person, with normal mood and affect.   ASSESSMENT:  A 74 year old male with acute on chronic systolic dysfunction, congestive heart failure, elevated BNP with demand ischemia, atrial fibrillation with rapid ventricular rate, chronic kidney disease, diabetes and coronary artery disease needing further treatment options.   RECOMMENDATIONS: 1.  Lasix drip for acute on chronic systolic dysfunction, congestive heart failure and chronic kidney disease.  2.  Continue carvedilol, beta blocker, ACE inhibitor and other treatments for cardiomyopathy from outpatient.  3.  Further treatment of pain in leg which is possibly causing current  symptoms and atrial fibrillation with rapid ventricular rate.  4.  Anticoagulation for further risk  reduction in stroke with atrial fibrillation.  5.  No further cardiac diagnostics at this time due to no current evidence of significant new true angina.  6.  Begin ambulation following for adjustments of medications as necessary.  7.  Possible discharge to home after further treatment after above.    ____________________________ Corey Skains, MD bjk:ea D: 01/18/2014 22:19:24 ET T: 01/18/2014 23:03:34 ET JOB#: [id number]  cc: Corey Skains, MD, <Dictator> Corey Skains MD ELECTRONICALLY SIGNED 01/23/2014 13:02

## 2015-02-10 NOTE — Discharge Summary (Signed)
PATIENT NAME:  Robert Bentley, Robert Bentley MR#:  [id number] DATE OF BIRTH:  [birth year]  DATE OF ADMISSION:  01/18/2014 DATE OF DISCHARGE:  01/31/2014  For a detailed note, please see the history and physical done on admission by Dr. Waldron Labs.   Please take a look at the interim discharge summary done by Dr. Benjie Karvonen which covers the extensive part of the hospital course from admission until April 12th. This is just an interim hospital course which covers hospital course from April 13th-14th.   DIAGNOSES AT DISCHARGE:  1. Exacerbation of underlying congestive heart failure.  2. Acute on chronic systolic congestive heart failure.  3. Acute deep vein thrombosis.  4. History of HIV and hepatitis C.  5. Acute on chronic renal failure.  6. Diabetes.  7. Anxiety.  8. Glaucoma.  9. Pneumonia - ruled out.  10. Acute on Chronic Renal Failure - likely ATN  DISCHARGE DIET: The patient is being discharged on a low-sodium, low-fat American Diabetic Association diet.   ACTIVITY: As tolerated.   FOLLOWUP:  With Dr. Serafina Royals in the next 1-2 weeks. Also follow up with Dr. Anthonette Legato in the next 1-2 weeks.  The patient is being discharged with home health nursing and physical therapy services.   DISCHARGE MEDICATIONS: Norvir 100 mg daily, Reyataz 300 mg daily, Lumigan 0.01% ophthalmic solution daily, Plavix 75 mg daily, Imdur 30 mg daily, sodium bicarbonate 650 mg 2 tabs b.i.d., vitamin D2 at 50,000 international units weekly, Xanax 0.5 mg b.i.d. as needed, sublingual nitroglycerin as needed, glimepiride 2 mg tablet 4 mg daily, Coreg 6.25 mg daily, lactulose 30 mL b.i.d., Tylenol with hydrocodone 10/325 one tab q. 6 hours as needed, zidovudine  300 mg b.i.d., lamivudine 100 mg daily, Lasix 80 mg b.i.d., and Coumadin 5 mg daily.   CONSULTANTS DURING HOSPITAL COURSE: Dr. Anthonette Legato, Dr. Murlean Iba, MD from nephrology, Dr. Nehemiah Massed from cardiology, Dr. Delight Hoh from hematology/oncology, Dr. Adrian Prows, infectious disease.   Again, please look at the detailed interim discharge summary done by Dr. Benjie Karvonen which covers the extensive part of the hospital course.   PERTINENT STUDIES DONE IN THE INTERIM HOSPITAL COURSE: None.   HOSPITAL COURSE: This is a 74 year old male with medical problems as mentioned above, presented to the hospital with worsening lower extremity edema and noted to be in CHF.  1. Acute systolic CHF. This was likely the cause of the patient's worsening lower extremity edema and shortness of breath. The patient was diuretic-resistant given his renal failure. He was actually admitted to the floor and then transferred to the ICU as he required a Lasix drip along with inotropes, including dobutamine and dopamine. The patient has done very well after being on a Lasix drip and being on inotropes. Was tapered off both the inotropes and the Lasix drip about a few days back. Is now being discharged on an oral dose of Lasix at 80 mg b.i.d.  His dry weight has significantly improved. At this point, the patient will continue the current dose of Lasix along with beta blockers and follow up with nephrology and cardiology as an outpatient.  2. Right lower extremity acute DVT. The patient has a history of underlying hepatitis C and has chronic thrombocytopenia; therefore, we had some hesitance on anticoagulating him. A hematology/oncology consult was obtained. The patient was seen by Dr. Grayland Ormond. The risks of bleeding were explained to the patient. At this point, the patient is being discharged on oral Coumadin for his underlying DVT.  3. Chest pain with elevated troponin. This was likely secondary to demand ischemia from his CHF. Cardiology did not plan on doing acute intervention. He will continue medical management with Plavix, beta blocker, and statin as stated.  4. Chronic atrial fibrillation. The patient's rates remained controlled. He is on Coreg. He will continue that. He was on  Coumadin.  His INR is therapeutic.  5. Acute on chronic kidney disease. The patient's creatinine was significantly elevated as he was diuretic-resistant. After being diuresed aggressively with the Lasix drip and being placed on inotropes, his creatinine has now come back to baseline. At this point, he is being discharged on higher dose of Lasix. The patient will continue followup with nephrology as an outpatient.  6. Thrombocytopenia. This is chronic for the patient related to his underlying hepatitis C. He had no evidence of acute bleeding. This further needs to be followed as an outpatient.  7. History of HIV. The patient was maintained on his highly antiretroviral medications, which he will continue with his lamivudine, zidovudine, Reyataz, and Norvir as stated.  8. History of hepatitis C. The patient follows up with a physician at [facility]. He is looking into possible treatment, and this should be further pursued with his hepatologist as an outpatient. 9. Pneumonia - this was suspected on admission and patient was started on broad spectrum antibiotics but it was ruled out during hospital course.  Patient was seen by ID and they ruled out any acute pneumonia and patient was taken off antibiotics.  10.  Acute on Chronic Renal Failure - likely related to ATN as per nephrology.  Pt. had baseline CKD Stage III and it had gotten worse on admission.  Patient was given diuretics and started on inotropes (Dobumatine, Dopamine) which helped with forward flow and improved patient's renal function.  Creatinine back to baseline upon discharge.   Physical therapy did evaluate the patient and thought he would benefit from home health physical therapy and also with nursing services which was arranged for him prior to discharge.   CODE STATUS: The patient is a full code.   TIMES SPENT ON THE DISCHARGE: 45 minutes    ____________________________ Belia Heman. Verdell Carmine, MD vjs:dd D: 01/31/2014 16:51:00  ET T: 01/31/2014 18:37:31 ET JOB#: [id number]  cc: Corey Skains, MD Munsoor Lilian Kapur, MD Belia Heman. Verdell Carmine, MD, <Dictator>   Henreitta Leber MD ELECTRONICALLY SIGNED 02/13/2014 10:39

## 2015-02-10 NOTE — H&P (Signed)
PATIENT NAME:  Robert Bentley, Robert Bentley MR#:  [id number] DATE OF BIRTH:  01-06-1941  DATE OF ADMISSION:  04/09/2014  PRIMARY CARE PHYSICIAN: Dr. Lavera Guise.  PRIMARY CARDIOLOGIST: Dr. Saralyn Pilar.  INFECTIOUS DISEASE PHYSICIAN: [facility].  HEPATOLOGY: [facility].  CHIEF COMPLAINT: Chest pain.  HISTORY OF PRESENT ILLNESS: The patient is a 74 year old male with known history of coronary artery disease with ischemic cardiomyopathy, recent echocardiogram in February has revealed ejection fraction of 25%, chronic atrial fibrillation on Coumadin, diabetes mellitus, lower extremity DVT on Coumadin, INR is subtherapeutic, a history of congestive heart failure, now fluid overloaded, history of HIV with good CD4 counts and regularly following up with [facility], hepatitis C following up with [facility] on a regular basis, is presenting to the ED with a chief complaint of chest pain. The patient recently had AICD placed regarding his chronic atrial fibrillation. The patient reports that chest pain started yesterday afternoon when he felt like some muscle pull sensation in the AICD area. Denies any dizziness. No diaphoresis. He took a sublingual nitroglycerin at home following which the chest pain was completely resolved. The patient came to the ED regarding his chest pain. His creatinine is worse than his baseline. Chest x-ray did not reveal any fluid overload. ER physician, Dr. Mliss Sax, has talked to the on-call cardiologist, Dr. Ubaldo Glassing, who has recommended gentle hydration. During my examination, the patient is doing fine. Denies any more chest pain. Wife is at bedside. No other complaints, reporting that he is following up with [facility] and [facility] on a regular basis regarding his HIV and hepatitis C respectively. Compliant with his medications.   PAST MEDICAL HISTORY: History of coronary artery disease status post stenting, ischemic cardiomegaly with a recent  echocardiogram in February revealed ejection fraction of 25% to [id number], chronic systolic congestive heart failure, chronic atrial fibrillation on Coumadin, history of DVT on Coumadin, frequent PVCs, diabetes mellitus, history of HIV and hepatitis C. He reports after he received a blood transfusion status post cholecystectomy surgery.   ALLERGIES: MORPHINE AND STATIN.   PAST SURGICAL HISTORY: Recent AICD placement and stent placement in the past.   PSYCHOSOCIAL HISTORY: Married and lives with wife. He quit smoking in 1977. He is retired from [employer]   FAMILY HISTORY: Mother died at age 42 from congestive heart failure.   REVIEW OF SYSTEMS:  CONSTITUTIONAL: Denies any fever, chills. Denies any fatigue or weakness.  EYES: Denies blurry vision, double vision, glaucoma.  ENT: Denies epistaxis, discharge, or tinnitus.  RESPIRATORY: Denies cough, COPD, hemoptysis.  CARDIOVASCULAR: Currently denies any chest pain, no erythema, syncope. AICD appears intact, minimal bruising is noticed.   GASTROINTESTINAL: Denies nausea, vomiting, and diarrhea, hematemesis or melena.  GENITOURINARY: No dysuria or hematuria, renal colic, or urinary frequency. ENDOCRINE: Denies polyuria, nocturia, heat or cold intolerance.  HEMATOLOGIC AND LYMPHATIC: At AICD site noticed some bruises, denies any bleeding.  INTEGUMENTARY: No acne, rash, lesions.  MUSCULOSKELETAL: No joint pain in the neck and back. Denies any gout.  NEUROLOGIC: Denies any CVA, TIA, ataxia, vertigo.  PSYCHIATRIC: No ADD, OCD. Denies any schizophrenia.  PHYSICAL EXAMINATION:  VITAL SIGNS: Temperature 97.4, pulse 61, respirations 20, blood pressure 151/70, pulse oximetry 100% on .  GENERAL APPEARANCE: Not in acute distress. Moderately built and nourished.  HEENT: Normocephalic, atraumatic. Pupils are equally reacting to light and accommodation. No scleral icterus. No conjunctival injection. No sinus tenderness. No postnasal drip. Dry  mucous membranes.  NECK: Supple. No JVD. No thyromegaly.  Range of motion is intact.  LUNGS: Clear to auscultation bilaterally. No accessory muscle usage. No anterior chest wall tenderness on palpation.  CARDIAC: AICD implanted area looks intact. Minimal bruising is noticed. No peripheral edema.  GASTROINTESTINAL: Soft. Bowel sounds are positive in all 4 quadrants. Nontender, nondistended. No hepatosplenomegaly. No masses felt.  NEUROLOGIC: Awake, alert, oriented x 3. Cranial nerves II-XII are intact. Motor and sensory are intact. Reflexes are 2+.  EXTREMITIES: No edema. No cyanosis. No clubbing.  SKIN: Warm to touch. Normal turgor. No rashes, no lesions.  MUSCULOSKELETAL: No joint effusion, tenderness, erythema.  PSYCHIATRIC: Normal mood and affect.   LABORATORIES AND IMAGING STUDIES: Glucose 183. BNP 7739. BUN 26, creatinine is at 2.91. Baseline seemed to be at around 2.5-2.6, sodium 133, potassium 5.0, chloride and CO2 are now normal. Anion gap is normal. GFR 21. Serum osmolality normal, calcium 8.4. First set of cardiac enzymes are normal. WBC 4.8, hemoglobin 9.3, hematocrit 27.5, platelet found is 82,000.  PT 16.6, INR 1.4.  EKG with normal sinus rhythm with a first-degree AV block and left bundle branch block, which is old. Chest x-ray, PA and lateral: No pulmonary edema, cardiomegaly, and small right-sided effusion.   ASSESSMENT AND PLAN: A 74 year old pleasant Caucasian male presenting to the Emergency Department with a chief complaint of chest pain. Recently had automatic implantable cardiac defibrillator placement with elevated creatinine than his baseline will be admitted with the following assessment and plan.  1. Chest pain, rule out acute myocardial infarction. Cycle cardiac biomarkers. Cardiology consult is placed to Dr. Ubaldo Glassing. As per ER physician's discussion with Dr. Ubaldo Glassing, the patient just had recent echocardiogram, which has revealed an ejection fraction of 25 to 30%. Will continue  Coreg, Imdur. The patient is allergic to statin. We will provide him aspirin with close monitoring of the platelet count. We will provide him baby aspirin. Continue Plavix.  2. Acute kidney injury on chronic kidney disease. Hold Lasix and gentle hydration with IV fluids, chronic atrial fibrillation. Continue amiodarone. Recent history of automatic implantable cardiac defibrillator placement. Check daily PT-INR. The patient is on Coumadin. We will continue that and INR is subtherapeutic.  3. History of lower extremity deep vein thrombosis per ER records. The patient is on Coumadin. INR is subtherapeutic.  4. History of HIV. Resume home medications. The patient is regularly following up with [facility]. His CD4 counts are normal as reported by the patient. No detectable viral load.  5. History of hepatitis C. Continue following up with [facility] as recommended.  6. History of diabetes mellitus. The patient will be on sliding scale insulin. Resume his home medication.   CODE STATUS: He is full code. Wife is the medical power of attorney.   We will provide DVT prophylaxis with heparin subcutaneous and INR is subtherapeutic. We will continue close monitoring of the platelet count as the patient is thrombocytopenic probably from hepatitis C.  Diagnosis and plan of care discussed in detail with the patient and his wife at bedside. They both verbalized understanding of the plan.   TIME SPENT: 50 minutes.    ____________________________ Nicholes Mango, MD ag:lt D: 04/09/2014 03:22:30 ET T: 04/09/2014 06:22:22 ET JOB#: [id number]  cc: Nicholes Mango, MD, <Dictator> Cletis Athens, MD Javier Docker. Ubaldo Glassing, MD Nicholes Mango MD ELECTRONICALLY SIGNED 04/09/2014 7:33

## 2015-02-10 NOTE — Discharge Summary (Signed)
PATIENT NAME:  NALU, LABA MR#:  [id number] DATE OF BIRTH:  [birth year]  DATE OF ADMISSION:  12/09/2013  DATE OF DISCHARGE:  12/13/2013  DISCHARGE DIAGNOSES:  1.  Acute systolic congestive heart failure.  2.  Chronic respiratory failure due to congestive heart failure, being discharged on oxygen 2 liters nasal cannula.  3.  Atrial fibrillation with rapid ventricular response.  4.  Worsening renal function.  5.  HIV and hepatitis C.  6.  Acute bronchitis with hemoptysis.  7.  Right ankle chronic wound.  8.  Hypertension.  9.  Diabetes.   CODE STATUS ON DISCHARGE:  FULL CODE.   MEDICATIONS ON DISCHARGE: 1.  Imdur 60 mg oral extended-release once a day.  2.  Lamivudine/Zidovudine 2 times a day.  3.  Januvia 50 mg once a day.  4.  Norvir. 5.  Aspirin enteric-coated 81 mg once a day.  7.  Plavix 75 mg oral once a day.  8.  Cardizem extended release 180 mg oral capsule.  9.  Levofloxacin 250 mg oral tablet every 24 hours for 5 days.   INSTRUCTIONS: Home oxygen on discharge: Yes, 2 liters nasal cannula oxygen. Advised renal diet and regular consistency. Activity as tolerated.   Timeframe to follow up:   Dr. Holley Raring in 1 to 2  days. Advised to follow in Nephrology clinic in 2 days, patient has appointment already. Follow up with Dr. Saralyn Pilar in 1 to 2 weeks, needs to make an appointment. Follow up with the Infectious Disease clinic. I spoke to his nurse practitioner at Infectious Disease clinic, and updated them about his condition and treatment we gave over here. They agreed to follow up and, if needed, then see him in the office within a week.   HOSPITAL COURSE: A 74 year old white male with multiple medical problems, who was recently hospitalized from January 20 to January 22, was admitted in acute-on-chronic renal failure at that time. He was discharged home, now came back, had some diarrhea for one day. After that, he was becoming short of breath. His heart started beating fast. In  the Emergency Room, he had tachycardia, with QRS being slightly wide. The rhythm of the heart was also irregular. ER physician spoke to Dr. Clayborn Bigness, who recommended digoxin and metoprolol. After this treatment, his heart rate continued to be elevated to 140s, so he was started on Cardizem drip and placed in critical care unit for further management. The patient did not have any other significant complaints.   1.  Atrial fibrillation with rapid ventricular response. Initially was on Cardizem drip; was also given digoxin and metoprolol. Heart rate came under control, so we switched to oral digoxin. TSH was normal. Dr. Saralyn Pilar followed in the hospital, and did not mention anything about his anticoagulation, even though AFib was persistent with rate control. I discussed with him in detail about anticoagulation and limited options because of his HIV medication and renal failure. He did not want to go on Coumadin, and also did not want to have any other anticoagulation like Pradaxa which was a reasonably suitable option in view of renal failure and HIV. After a detailed discussion, patient wanted to just continue taking aspirin and Plavix as he was taking at home. I also advised him to discuss this option again with Dr. Saralyn Pilar in the office when he goes for a cardiology checkup.   2.  Systolic congestive heart failure. We gave him his old dose of IV Lasix and monitored. Echocardiogram was done, ejection fraction  was 25%. He was already planning to move to  Dr. Saralyn Pilar to schedule for AICD.    3.  Hyperkalemia; this was with renal failure. Kayexalate was provided repeatedly, and he was also started on bicarb orally to have better control of his potassium at home. Advised to follow with Dr. Holley Raring in office also.    4.  Acute bronchitis with HIV and hepatitis C. He was initially started on Augmentin, but was not feeling very well with Augmentin, with some stomach upset, so we switched it to Levaquin and he  was tolerating it very nicely. I spoke to his Infectious Disease clinic and to his nurse practitioner on the phone about his issues and antibiotic requirement. They agreed to follow up with him on phone within a week, and if needed, will see him in the office about his condition and overall progress.  CONSULTATIONS IN THE HOSPITAL:  Cardiology consult, Dr. Clayborn Bigness. Nephrology consult, Dr. Lavonia Dana and Dr. Murlean Iba.   LABORATORY DATA: BUN was 33, creatinine was 2.05 on admission, potassium was 5.5. Troponin 0.05. WBC count 6.1, hemoglobin 11.5, and platelet count 57. Creatinine went up to 2.43 on February 22, and on February 23 was 2.64.   Chest x-ray on February 23 cannot exclude very mild congestive heart failure and interstitial pulmonary edema. Mild pneumonitis not excluded.   Creatinine went up to 2.73 on February 23, and on February 24 came down to 2.48. Potassium level was 5.4 on February 24. He was discharged after Kayexalate.   TOTAL TIME SPENT ON THIS DISCHARGE:  40 minutes.    ____________________________ Ceasar Lund Anselm Jungling, MD vgv:mr D: 12/17/2013 23:27:00 ET T: 12/18/2013 16:20:07 ET JOB#: [id number]  cc: Mamie Levers, MD Isaias Cowman, MD Munsoor Lilian Kapur, MD Ceasar Lund Anselm Jungling, MD, <Dictator>        Vaughan Basta MD ELECTRONICALLY SIGNED 12/26/2013 7:52

## 2015-02-10 NOTE — Discharge Summary (Signed)
PATIENT NAME:  Robert Bentley, Robert Bentley MR#:  [id number] DATE OF BIRTH:  1941-03-24  DATE OF ADMISSION:  04/09/2014  DATE OF DISCHARGE:  04/09/2014  For detailed note, please look at the History and Physical done on admission by Dr. Valentino Nose.  DIAGNOSES ON DISCHARGE:  1.  Chest pain, likely suspected to be anxiety or GERD-related.  2.  History of cardiomyopathy, ejection fraction 25%. 3.  History of HIV. 4.  History of hepatitis C. 5.  Diabetes. 6.  Anxiety. 7.  History of previous deep vein thrombosis.   INSTRUCTIONS:  The patient is being discharged on a low-sodium, low-fat, American Diabetic Association diet. Activity is as tolerated. Follow up with Dr. Miquel Dunn in the next 1 to 2 weeks.   DISCHARGE MEDICATIONS: Norvir 100 mg daily, Reyataz 300 mg daily, Lumigan 0.01% ophthalmic solution daily, Plavix 75 mg daily, Imdur 30 mg daily, sodium bicarbonate 650 mg 2 tabs b.i.d., vitamin D2, 50,000 international units weekly, Xanax 0.5 mg b.i.d. as needed, nitroglycerin sublingual as needed, glimepiride 2 mg tablet 4 mg daily, Coreg 6.25 mg daily, lactulose 30 mL b.i.d., Tylenol with hydrocodone 1 tab every 6 hours as needed, Zidovudine 300 mg b.i.d.,  Lamivudine 100 mg 1-1/2 tabs daily, Lasix 80 mg b.i.d., Coumadin 5 mg alternating with 7.5, amiodarone 400 mg daily, Zofran 4 mg at bedtime as needed.   [place] COURSE: Dr. Jordan Hawks from Cardiology.   PERTINENT STUDIES DONE DURING THE HOSPITAL COURSE: A chest x-ray done on admission showing cardiomegaly, with right small effusion, no pulmonary edema.   BRIEF HOSPITAL COURSE: This is a 74 year old male with medical problems as mentioned above, presented to the hospital secondary to chest pain.   1.  Chest pain. Given the patient's history of diabetes and underlying cardiomyopathy, patient was observed overnight on telemetry, had 3 sets of cardiac markers checked, which were negative. He was seen by Cardiology, who did not  want to pursue any further aggressive intervention. He is currently chest pain-free and hemodynamically stable, and therefore is being discharged home on his aspirin, beta blocker, and statin. The most likely cause of the chest pain was probably anxiety, or possibly reflux-related.   2.  Acute on chronic renal failure. This was mild, secondary to dehydration. The patient received some gentle IV fluids. The patient will continue followup with Dr. Holley Raring as an outpatient.   3.  History of HIV and hepatitis C. The patient follows up at [facility]. He will continue his highly antiretroviral therapy.   4.  History of deep vein thrombosis. The patient is on Coumadin, his INR is therapeutic. He will continue followup as an outpatient.   5.  Diabetes. The patient's blood sugars remain stable. The patient was maintained on his Amaryl and sliding scale insulin, and he will continue that upon discharge.   6.  Anxiety. The patient was maintained on Xanax. He will resume that.  7.  History of cardiomyopathy, ejection fraction 25%. Clinically in the hospital, patient had no evidence of congestive heart failure. He will continue his Imdur and Coreg.   The patient is being discharged home.   Time spent on discharge is 35 minutes.    ____________________________ Belia Heman. Verdell Carmine, MD vjs:mr D: 04/09/2014 13:47:00 ET T: 04/09/2014 18:19:19 ET JOB#: [id number]  cc: Belia Heman. Verdell Carmine, MD, <Dictator> Isaias Cowman, MD  Henreitta Leber MD ELECTRONICALLY SIGNED 04/18/2014 20:49

## 2015-02-10 NOTE — H&P (Signed)
PATIENT NAME:  Robert Bentley, BRUMMER MR#:  [id number] DATE OF BIRTH:  Oct 25, 1940  DATE OF ADMISSION:  12/09/2013  PRIMARY CARE PROVIDER: Cletis Athens, MD  CARDIOLOGIST: Isaias Cowman, MD  CHIEF COMPLAINT: Shortness of breath, palpitations.   HISTORY OF PRESENT ILLNESS: The patient is a 74 year old white male with multiple medical problems who was recently hospitalized on 01/20 to 01/22. At that time, he was admitted with acute on chronic renal failure, hyperkalemia. The patient was discharged home, comes back today stating that he had diarrhea for 1 day yesterday, and then after that he started becoming short of breath. His heart started beating fast. The patient came to the ED and was noted to have tachycardia, with QRS being slightly wide. He has a history of having this previously. His heart rhythm is irregular. The ED physician spoke to Dr. Clayborn Bigness, who recommended digoxin and metoprolol. However, despite these treatments, his heart rate continues to be elevated in the 140s. The patient otherwise complains of restless night sleep and shortness of breath waking up. He also has a history of PTSD. He otherwise denies any nausea, vomiting or diarrhea. Denies any urinary symptoms.   PAST MEDICAL HISTORY:  1.  History of MI with PCI at [facility] in 2003.  2.  Ischemic cardiomyopathy with EF of 25%. 3.  Chronic systolic CHF.  4.  Chronic A. fib.  5.  Frequent PVCs.  6.  Diabetes.  7.  HIV. 8.  Hepatitis C.  CURRENT MEDICATIONS: He is on aspirin 81 mg 1 tab p.o. daily, carvedilol 12.5 daily, Plavix 75 p.o. daily, Imdur 60 daily, and glimepiride 2 mg t.i.d., Januvia 50 daily, Norvir 100 daily, Reyataz 300 mg 1 tab p.o. daily.    ALLERGIES: MORPHINE.   SOCIAL HISTORY: The patient is married. He is retired from [employer] He quit smoking in 1977.   FAMILY HISTORY: Mother died at age 40 from congestive heart failure.   REVIEW OF SYSTEMS:    CONSTITUTIONAL: Denies any  fevers. Complains of fatigue, weakness. No pain. No weight loss. No weight gain.  EYES: No blurred or double vision. No pain. No redness. No inflammation. No glaucoma. No cataracts.  EARS, NOSE, THROAT: No tinnitus. No ear pain. No hearing loss. No seasonal or year-round allergies. No epistaxis. No nasal discharge. No difficulty with swallowing.  RESPIRATORY: Denies any cough, wheezing, hemoptysis.  CARDIOVASCULAR: Denies any chest pain. Complains of orthopnea, dyspnea on exertion, and has atrial fibrillation.  GASTROINTESTINAL: Had diarrhea yesterday, none now. No abdominal pain. No hematemesis. No melena.  GENITOURINARY: Denies any dysuria, hematuria, renal calculus or frequency.  ENDOCRINE: Denies any polyuria, nocturia or thyroid problems.  HEMATOLOGIC AND LYMPHATIC: Denies anemia, easy bruisability or bleeding.  SKIN: No acne. Has a skin graft on the right lower extremity.  MUSCULOSKELETAL: Denies any pain in the neck, back or shoulder.  NEUROLOGIC: No numbness, CVA or TIA.  PSYCHIATRIC: No anxiety. Has PTSD according to him.   PHYSICAL EXAMINATION: VITAL SIGNS: Temperature 98, pulse 145, respirations 20, blood pressure 136/91, O2 sat 100%.  GENERAL: The patient is a well-developed, well-nourished male in no acute distress.  HEENT: Head atraumatic, normocephalic. Pupils equally round, reactive to light and accommodation. There is no conjunctival pallor. No scleral icterus. Nasal exam shows no drainage or ulceration. Oropharynx is clear without any exudate.  NECK: Supple without any JVD.  CARDIOVASCULAR: Irregularly irregular rhythm. There are no murmurs, gallops.  LUNGS: Occasional crackles at the bases.  ABDOMEN: Soft, nontender, nondistended. Positive  bowel sounds x 4.  EXTREMITIES: No clubbing, cyanosis, edema.  SKIN: He has a skin graft in the right lower extremity.  NEUROLOGIC: Awake, alert, oriented x 3. No focal deficits.   LABORATORY AND RADIOLOGICAL EVALUATIONS: Chest x-ray  shows suboptimal inspiration, stable cardiomegaly, pulmonary venous hypertension. Glucose 90, BUN 34, creatinine 2.02, sodium 136, potassium 5.5, chloride 111; CO2 is 20; alkaline phosphatase is 154; ALT is 24; AST is 31. WBC 5.8, hemoglobin 12.9; platelet count is 68. BNP is 12,869.  ASSESSMENT AND PLAN: The patient is a 74 year old white male with history of atrial fibrillation, history of systolic congestive heart failure, diabetes, hepatitis C, HIV, who presents with shortness of breath, noted to have a heart rate in 140s.  1.  Atrial fibrillation with rapid ventricular response: At this time will treat him with Cardizem drip. He was given digoxin and metoprolol. Heart rate continues to be elevated. ED physician has spoken to Dr. Clayborn Bigness. We will also obtain an echocardiogram of the heart. Check a TSH.  2.  Possible systolic congestive heart failure: In light of his creatinine being high, will give him low-dose IV Lasix and monitor his renal function.  3.  Hyperkalemia and acute renal failure: I will give him Kayexalate x 1, have nephrology see the patient again.  4.  Chronic atrial fibrillation: On Plavix and aspirin, which we will continue. Cardiology to see.  5.  Diabetes: Continue glyburide and Januvia as taking at home Place him on sliding scale insulin.  6.  He will be on heparin for deep vein thrombosis prophylaxis.    NOTE: Time spent on this patient is 50 minutes of critical time. The patient's heart rate is in the 140s and at high risk of cardiopulmonary compromise. We will be monitoring him closely in the Intensive Care Unit.     ____________________________ Lafonda Mosses. Posey Pronto, MD shp:jcm D: 12/09/2013 20:14:00 ET T: 12/09/2013 20:56:23 ET JOB#: [id number]  cc: Luiscarlos Kaczmarczyk H. Posey Pronto, MD, <Dictator> Alric Seton MD ELECTRONICALLY SIGNED 12/23/2013 17:58

## 2015-02-10 NOTE — Consult Note (Signed)
History of Present Illness:  Reason for Consult DVT in the setting of thrombocytopenia.   HPI   Patient is a 74 year old male with multiple medical problems including CHF, HIV, and hepatitis C who was admitted to the hospital with worsening shortness of breath and lower extremity edema. He was also noted to be thrombocytopenic which have been relatively unchanged since February 2015. Further workup of his lower edema revealed a right lower extremity DVT.  He continues to feel short of breath, but improved since admission. He has no neurologic complaints. He denies any fevers. He denies any recent weight loss. He has no chest pain or cough. He denies any nausea, vomiting, constipation, or diarrhea. He denies any leg pain. Patient otherwise feels well and offers no further specific complaints.  PFSH:  Additional Past Medical and Surgical History CAD status post stenting, ischemic cardiomyopathy with EF of 25%, CHF, atrial fibrillation, diabetes, HIV and hepatitis C status post a blood transfusion, cholecystectomy, chronic renal insufficiency  Social history: Retired from the [employer], quit smoking in 1979. No report of alcohol.   Family history: Mother with CHF.   Review of Systems:  Performance Status (ECOG) 0   Review of Systems   As per HPI. Otherwise, 10 point system review was negative.   NURSING NOTES: **Vital Signs.:   03-Apr-15 11:37   Vital Signs Type: Routine   Temperature Temperature (F): 97.7   Celsius: 36.5   Temperature Source: oral   Pulse Pulse: 105   Respirations Respirations: 18   Systolic BP Systolic BP: 629   Diastolic BP (mmHg) Diastolic BP (mmHg): 92   Mean BP: 107   Pulse Ox % Pulse Ox %: 97   Pulse Ox Activity Level: At rest   Oxygen Delivery: 2L   Physical Exam:  Physical Exam General: Well-developed, well-nourished, no acute distress. Eyes: Pink conjunctiva, anicteric sclera. HEENT: Normocephalic, moist mucous  membranes, clear oropharnyx. Lungs: Clear to auscultation bilaterally. Heart: Regular rate and rhythm. No rubs, murmurs, or gallops. Abdomen: Soft, nontender, nondistended. No organomegaly noted, normoactive bowel sounds. Musculoskeletal: No edema, cyanosis, or clubbing. Neuro: Alert, answering all questions appropriately. Cranial nerves grossly intact. Skin: No rashes or petechiae noted. Psych: Normal affect. Lymphatics: No cervical, calvicular, axillary or inguinal LAD.    Other- Explain in Comments Line: Unknown  Morphine: Unknown  Statins: Unknown    sodium bicarbonate 650 mg oral tablet: 2 tab(s) orally 2 times a day, Status: Active, Quantity: 14, Refills: None   clopidogrel 75 mg oral tablet: 1 tab(s) orally once a day, Status: Active, Quantity: 0, Refills: None   Aspirin Enteric Coated 81 mg oral delayed release tablet: 1 tab(s) orally once a day, Status: Active, Quantity: 0, Refills: None   lamiVUDine-zidovudine 150 mg-300 mg oral tablet: 1 tab(s) orally 2 times a day, Status: Active, Quantity: 0, Refills: None   Norvir 100 mg oral tablet: tab(s) orally once a day, Status: Active, Quantity: 0, Refills: None   Reyataz 300 mg oral capsule: 1 cap(s) orally once a day, Status: Active, Quantity: 0, Refills: None   Lumigan 0.01% ophthalmic solution: 1 drop(s) to each affected eye once a day (in the evening), Status: Active, Quantity: 0, Refills: None   Imdur 30 mg oral tablet, extended release: 1 tab(s) orally once a day (in the morning), Status: Active, Quantity: 0, Refills: None   glimepiride 2 mg oral tablet:  orally 3 times a day, Status: Active, Quantity: 0, Refills: None   Lasix 40 mg oral tablet: 1  tab(s) orally once a day, Status: Active, Quantity: 0, Refills: None   Vitamin D2 50,000 intl units oral capsule: 1 cap(s) orally every 7 days, Status: Active, Quantity: 0, Refills: None   Xanax 0.5 mg oral tablet: 1  orally 2 times a day, As Needed, Status: Active, Quantity:  0, Refills: None  Laboratory Results: Routine Chem:  03-Apr-15 05:14   Glucose, Serum  132  BUN  56  Creatinine (comp)  2.54  Sodium, Serum 138  Potassium, Serum 4.4  Chloride, Serum 102  CO2, Serum 32  Calcium (Total), Serum 8.7  Anion Gap  4  Osmolality (calc) 293  eGFR (African American)  29  eGFR (Non-African American)  25 (eGFR values <32m/min/1.73 m2 may be an indication of chronic kidney disease (CKD). Calculated eGFR is useful in patients with stable renal function. The eGFR calculation will not be reliable in acutely ill patients when serum creatinine is changing rapidly. It is not useful in  patients on dialysis. The eGFR calculation may not be applicable to patients at the low and high extremes of body sizes, pregnant women, and vegetarians.)   Assessment and Plan: Impression:   DVT in the setting of thrombocytopenia. Plan:   1. DVT: Despite patient's thrombocytopenia as well as on Plavix and aspirin, would continue heparin and recommend initiating Coumadin. This has been ordered. Patient will have to be monitored closely given the increased risk of bleeding. The risks and benefits were discussed at length with the patient and he agreed to proceed. Patient require an INR of 2.0-3.0. Cannot use Lovenox given his chronic renal insufficiency. I do not think an IVC filter is necessary. Patient will require 3 months of Coumadin.Thrombocytopenia: Likely multifactorial with his liver disease, multiple medications, and acute illnesses. Will get a splenic ultrasound for completeness. consult, will follow.  Electronic Signatures: FDelight Hoh(MD)  (Signed 03-Apr-15 17:33)  Authored: HISTORY OF PRESENT ILLNESS, PFSH, ROS, NURSING NOTES, PE, ALLERGIES, HOME MEDICATIONS, LABS, ASSESSMENT AND PLAN   Last Updated: 03-Apr-15 17:33 by FDelight Hoh(MD)

## 2015-02-15 DIAGNOSIS — E11621 Type 2 diabetes mellitus with foot ulcer: Secondary | ICD-10-CM | POA: Diagnosis not present

## 2015-02-15 DIAGNOSIS — L97311 Non-pressure chronic ulcer of right ankle limited to breakdown of skin: Secondary | ICD-10-CM | POA: Diagnosis not present

## 2015-02-15 DIAGNOSIS — B20 Human immunodeficiency virus [HIV] disease: Secondary | ICD-10-CM | POA: Diagnosis not present

## 2015-02-15 DIAGNOSIS — S81801A Unspecified open wound, right lower leg, initial encounter: Secondary | ICD-10-CM | POA: Diagnosis not present

## 2015-02-15 DIAGNOSIS — I87311 Chronic venous hypertension (idiopathic) with ulcer of right lower extremity: Secondary | ICD-10-CM | POA: Diagnosis not present

## 2015-02-15 DIAGNOSIS — H40003 Preglaucoma, unspecified, bilateral: Secondary | ICD-10-CM | POA: Diagnosis not present

## 2015-02-15 DIAGNOSIS — B182 Chronic viral hepatitis C: Secondary | ICD-10-CM | POA: Diagnosis not present

## 2015-02-18 NOTE — Consult Note (Signed)
CHIEF COMPLAINT and HISTORY:  Subjective/Chief Complaint near syncope   History of Present Illness Patient was admitted after abrupt onset of near syncope last night.  He had a sensation on two occasions with attempting to stand that resulted in him nearly passing out and having to lie down.  He did not have focal arm or leg weakness or numbness, no speech or swallowing issues, no visual symtpoms.  He reports no previous episodes.  No pain.  Feels well now and denies any dizziness or syncopal feelings today.  Has a large number of medical issues listed below and has known CKD.  Had hyperkalemia on admission, better now.  Work up included a head CT which was normal and then a carotid duplex today.  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  States that his PCP checked this some years ago and told him his carotid disease was mild and could be checked in several years.  He is already on Coumadin and Plavix for cardiac issues.  can not take statins due to Allergy.   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 Active  zidovudine 300 mg oral tablet 1 tab(s) orally every 12 hours Active   lamiVUDine 100 mg oral tablet 1.5 tab(s) orally once a day Active  glimepiride 2 mg oral tablet 2 milligram(s) orally once a day as needed Active  lactulose 10 g/15 mL oral syrup 30 milliliter(s) orally 2 times a day, As Needed - for Constipation Active  carvedilol 6.25 mg oral tablet 1 tab(s) orally once a day Active  nitroglycerin 0.4 mg sublingual tablet 1 tab(s) sublingual , As needed, chest pain Active  sodium bicarbonate 650 mg oral tablet 2 tab(s) orally 2 times a day Active  Norvir 100 mg oral tablet tab(s) orally once a day Active  Reyataz 300 mg oral capsule 1 cap(s) orally once a day Active  Lumigan 0.01% ophthalmic solution 1 drop(s) to each affected eye once a day (in the evening) Active  Imdur 30 mg oral tablet, extended release 1 tab(s) orally once a day (in the morning) Active  Xanax 0.5 mg oral tablet 1  orally 2 times a day, As Needed Active  amiodarone 400 mg oral tablet .5 tab(s) orally once a day Active  D3 1000 1000 intl units oral tablet 2 tab(s) orally once a day Active  Coumadin 7.5 mg oral tablet 1 tab(s) orally once a day Active  Senokot  orally once a day Active  ondansetron 4 mg oral tablet 1 tab(s) orally once a day (at bedtime), As Needed Active  Plavix 75 mg oral tablet 1 tab(s) orally  once a day Active   Family and Social History:  Family History Non-Contributory   Social History positive tobacco (Greater than 1 year), negative ETOH, negative Illicit drugs   Place of Living Home   Review of Systems:  Subjective/Chief Complaint Near syncope as per HPI No heat or cold intolerance No dysuria/hematuria No blurry or double vision No tinnitus or ear pain No rashes or ulcer No suicidal ideation or psychosis No signs of bleeding or easy bruising No SOB/DOE, orthopnea, or sputum No palpitations or chest pain No N/V/D or abdominal pain No joint pain or joint swelling No fever or chills No unintentional weight loss or gain   Medications/Allergies  Reviewed Medications/Allergies reviewed   Physical Exam:  GEN well developed, well nourished, no acute distress   HEENT pink conjunctivae, hearing intact to voice   NECK No masses  trachea midline   RESP normal resp effort  no use of accessory muscles   CARD regular rate  no JVD   VASCULAR ACCESS none   ABD denies tenderness  soft   LYMPH negative neck, negative axillae   EXTR negative cyanosis/clubbing, positive edema, marked stasis changes to the right lower leg.  Mild swelling at this time.  No open wounds.   SKIN No rashes, skin turgor good   NEURO cranial nerves intact, follows commands, motor/sensory function intact   PSYCH alert, A+O to time, place, person, good insight   LABS:  Laboratory Results: Hepatic:    01-Feb-16 02:29, Hepatic Function Panel A  Bilirubin, Total 1.5  Bilirubin, Direct 0.3  Result(s) reported on 20 Nov 2014 at 03:30AM.  Alkaline Phosphatase 91  SGPT (ALT) 65  SGOT (AST) 51  Total Protein, Serum 6.6  Albumin, Serum 3.0  Routine Micro:    31-Jan-16 22:12, Urine Culture  Micro Text Report   URINE CULTURE    COMMENT                   NO GROWTH IN 8-12 HOURS     ANTIBIOTIC  Specimen Source   CLEAN CATCH  Culture Comment   NO GROWTH IN 8-12 HOURS   Result(s) reported on 20 Nov 2014 at 10:11AM.  Routine Chem:    31-Jan-16 01:74, Basic Metabolic Panel (w/Total Calcium)  Glucose, Serum 245  BUN 41  Creatinine (comp) 2.67  Sodium, Serum 134  Potassium, Serum 6.5  Chloride, Serum 102  CO2, Serum 28  Calcium (Total), Serum 8.3  Anion Gap 4  Osmolality (calc) 286  eGFR (African American) 31  eGFR (Non-African American) 25  eGFR values <32m/min/1.73 m2 may be an indication of chronic  kidney disease (CKD).  Calculated eGFR, using the MRDR Study equation, is useful in   patients with stable renal function.  The eGFR calculation will not be reliable in acutely ill patients  when serum creatinine is changing rapidly. It is not  useful in  patients on dialysis. The eGFR calculation may not be applicable  to patients at the low and high extremes of body sizes, pregnant  women, and vegetarians.  Result Comment   POTASSIUM - RESULTS VERIFIED BY REPEAT TESTING.   - NOTIFIED OF CRITICAL VALUE   - C/ATI OLOWABI AT 2206 11/19/14.PMH   - READ-BACK PROCESS PERFORMED.   Result(s) reported on 19 Nov 2014 at 09:55PM.    31-Jan-16 21:38, CBC Profile  Result Comment   PLATELET - RESULTS VERIFIED BY REPEAT TESTING.   - VERIFIED BY SMEAR ESTIMATE   Result(s) reported on 19 Nov 2014  at 10:23PM.    01-Feb-16 00:14, Potassium, Serum  Potassium, Serum 6.4  Result(s) reported on 20 Nov 2014 at 12:41AM.    01-Feb-16 16:07, Basic Metabolic Panel (w/Total Calcium)  Glucose, Serum 211  BUN 43  Creatinine (comp) 2.75  Sodium, Serum 136  Potassium, Serum 6.4  Chloride, Serum 104  CO2, Serum 32  Calcium (Total), Serum 8.3  Anion Gap 0  Osmolality (calc) 289  eGFR (African American) 30  eGFR (Non-African American) 24  eGFR values <40m/min/1.73 m2 may be an indication of chronic  kidney disease (CKD).  Calculated eGFR, using the MRDR Study equation, is useful in   patients with stable renal function.  The eGFR calculation will not be reliable in acutely ill patients  when serum creatinine is changing rapidly. It is not useful in  patients on dialysis. The eGFR calculation may not be applicable  to patients at the low and high extremes of body sizes, pregnant  women, and vegetarians.    01-Feb-16 10:24, Magnesium, Serum  Magnesium, Serum 2.3  1.8-2.4  THERAPEUTIC RANGE: 4-7 mg/dL  TOXIC: > 10 mg/dL   -----------------------    01-Feb-16 10:24, Potassium, Serum  Potassium, Serum 5.2  Result(s) reported on 20 Nov 2014 at 10:47AM.  Cardiac:    31-Jan-16 21:38, Troponin I  Troponin I < 0.02  0.00-0.05  0.05 ng/mL or less: NEGATIVE   Repeat testing in 3-6 hrs   if clinically indicated.  >0.05 ng/mL: POTENTIAL    MYOCARDIAL INJURY. Repeat   testing in 3-6 hrs if   clinically indicated.  NOTE: An increase or decrease   of 30% or more on serial   testing suggests a   clinically important change    01-Feb-16 02:29, Troponin I  Troponin I < 0.02  0.00-0.05  0.05 ng/mL or less: NEGATIVE   Repeat testing in 3-6 hrs   if clinically indicated.  >0.05 ng/mL: POTENTIAL   MYOCARDIAL INJURY. Repeat   testing in 3-6 hrs if   clinically indicated.  NOTE: An increase or decrease   of 30% or more on serial   testing suggests a   clinically important change    01-Feb-16 06:43, Troponin I  Troponin I < 0.02  0.00-0.05  0.05 ng/mL or less: NEGATIVE   Repeat testing in 3-6 hrs   if clinically indicated.  >0.05 ng/mL: POTENTIAL   MYOCARDIAL INJURY. Repeat   testing in 3-6 hrs if   clinically indicated.  NOTE: An increase or decrease   of 30% or more on serial   testing suggests a   clinically important change  Routine UA:    31-Jan-16 22:12, Urinalysis  Color (UA) Yellow  Clarity (UA) Clear  Glucose (UA) 50 mg/dL  Bilirubin (UA) Negative  Ketones (UA) Negative  Specific Gravity (UA) 1.011  Blood (UA) Negative  pH (UA) 6.0  Protein (UA)   100 mg/dL  Nitrite (UA) Negative  Leukocyte Esterase (UA) Negative  Result(s) reported on 19 Nov 2014 at 11:54PM.  RBC (UA) 1 /HPF  WBC (UA) 2 /HPF  Bacteria (UA)   NONE SEEN  Epithelial Cells (UA)   NONE SEEN  Mucous (UA) PRESENT  Hyaline Cast (UA) 8 /LPF  Result(s) reported on 19 Nov 2014 at 11:54PM.  Routine Coag:    01-Feb-16 02:29, Prothrombin Time  Prothrombin 20.2  11.4-15.0  NOTE: New Reference Range   11/17/14  INR 1.7  INR reference interval applies to patients on anticoagulant therapy.  A single INR therapeutic range for coumarins  is not optimal for all  indications; however, the suggested range for most indications is  2.0 - 3.0.  Exceptions to the INR Reference Range may include: Prosthetic heart  valves, acute myocardial  infarction, prevention of myocardial  infarction, and combinations of aspirin and anticoagulant. The need  for a higher or lower target INR must be assessed individually.  Reference: The Pharmacology and Management of the Vitamin K   antagonists: the seventh ACCP Conference on Antithrombotic and  Thrombolytic Therapy. GXQJJ.9417 Sept:126 (3suppl): [id number].  A HCT value >55% may artifactually increase the PT.  In one study,   the increase was an average of 25%.  Reference:  "Effect on Routine and Special Coagulation Testing Values  of Citrate Anticoagulant Adjustment in Patients with High HCT Values."  American Journal of Clinical Pathology 2006;126:400-405.  Routine Hem:    31-Jan-16 21:38, CBC Profile  WBC (CBC) 5.7  RBC (CBC) 2.82  Hemoglobin (CBC) 11.2  Hematocrit (CBC) 33.6  Platelet Count (CBC) 71  MCV 119  MCH 39.9  MCHC 33.5  RDW 13.4  Neutrophil % 66.2  Lymphocyte % 20.8  Monocyte % 11.7  Eosinophil % 1.0  Basophil % 0.3  Neutrophil # 3.8  Lymphocyte # 1.2  Monocyte # 0.7  Eosinophil # 0.1  Basophil # 0.0   RADIOLOGY:  Radiology Results: XRay:    20-Feb-15 18:46, Chest Portable Single View  Chest Portable Single View  REASON FOR EXAM:    shortness of breath, fluid retention  COMMENTS:       PROCEDURE: DXR - DXR PORTABLE CHEST SINGLE VIEW  - Dec 09 2013  6:46PM     CLINICAL DATA:  Chest pain.  Shortness of breath.  Fluid retention.    EXAM:  PORTABLE CHEST - 1 VIEW    COMPARISON:  DG CHEST 2V dated 08/14/2012; DG CHEST 1V PORT dated  07/05/2011    FINDINGS:  Suboptimal inspiration accounts for crowded bronchovascular  markings, especially in the bases, and accentuates the cardiac  silhouette. Taking this into account, cardiac silhouette mildly  enlarged but stable when compared to the prior portable examination  in 2012. Mild pulmonary venous hypertension without overt edema.  Lungs clear. Bronchovascular markings normal. No visible  pleural  effusions. No pneumothorax.     IMPRESSION:  Suboptimal inspiration. Stable cardiomegaly. Pulmonary venous  hypertension without overt edema. No acute cardiopulmonary disease.      Electronically Signed    By: Evangeline Dakin M.D.    On: 12/09/2013 19:18     Verified By: Deniece Portela, M.D.,    21-Feb-15 06:12, Chest Portable Single View  Chest Portable Single View  REASON FOR EXAM:    recent onset cough  COMMENTS:       PROCEDURE: DXR - DXR PORTABLE CHEST SINGLE VIEW  - Dec 10 2013  6:12AM     CLINICAL DATA:  Cough, shortness of breath    EXAM:  PORTABLE CHEST - 1 VIEW    COMPARISON:  12/09/2013    FINDINGS:  Mild cardiomegaly with minor central vascular congestion and basilar  atelectasis. No current CHF or focal pneumonia. Negative for  effusion or pneumothorax. Trachea is midline. Atherosclerosis of the  aorta.     IMPRESSION:  Cardiomegaly with vascularcongestion.  Stable exam.      Electronically Signed    By: Daryll Brod M.D.    On: 12/10/2013 08:06         Verified By: Earl Gala, M.D.,    [phone]  11:07, Chest PA and Lateral  Chest PA and Lateral  REASON FOR EXAM:    compare CHF, Bronchitis  COMMENTS:       PROCEDURE: DXR - DXR CHEST PA (OR AP) AND LATERAL  - Dec 12 2013 11:07AM     CLINICAL DATA:  Bronchitis.  CHF.    EXAM:  CHEST  2 VIEW    COMPARISON:  DG CHEST 1V PORT dated 12/10/2013    FINDINGS:  Cardiomegaly with mild pulmonary vascular prominence. Mild basilar  interstitial prominence noted. Subtle Kerley B-lines noted. These  findings suggest mild congestive heart failure. Pneumonitis cannot  be excluded. No pleural effusion or pneumothorax. Degenerative  changes thoracic spine.     IMPRESSION:  Cannot exclude very mild congestive heart failure and interstitial  pulmonary edema. Mild pneumonitis not excluded.      Electronically Signed    By: Marcello Moores  Register    On: 12/12/2013 13:25         Verified  By: Osa Craver, M.D., MD    01-Apr-15 02:12, Chest Portable Single View  Chest Portable Single View  REASON FOR EXAM:    swelling, sob  COMMENTS:       PROCEDURE: DXR - DXR PORTABLE CHEST SINGLE VIEW  - Jan 18 2014  2:12AM     CLINICAL DATA:  Swelling, short of breath    EXAM:  PORTABLE CHEST - 1 VIEW    COMPARISON:  Prior chest x-ray 12/12/2013    FINDINGS:  Stable cardiomegaly. Interval development of dense right basilar  opacity favored to reflect a layering pleural effusion with  atelectasis and elevation of the right hemidiaphragm. Superimposed  infiltrate is difficult to exclude. Very mildvascular congestion  without overt edema. No pneumothorax. No acute osseous abnormality.  Stable central bronchitic changes and mild left basilar interstitial  prominence.     IMPRESSION:  1. Interval development of a dense right basilar opacity favored to  reflect a layering pleural effusion and atelectasis. Superimposed  infiltrate is difficult to exclude radiographically.  2. Stable cardiomegaly and pulmonary vascular congestion without  edema.      Electronically Signed    By: Jacqulynn Cadet M.D.    On: 01/18/2014 02:11         Verified By: Criselda Peaches, M.D.,    03-Apr-15 12:42, Chest Portable Single View  Chest Portable Single View  REASON FOR EXAM:    SOB and chest pain  COMMENTS:       PROCEDURE: DXR - DXR PORTABLE CHEST SINGLE VIEW  - Jan 20 2014 12:42PM     CLINICAL DATA:  Shortness of breath, chest pain    EXAM:  PORTABLE CHEST - 1 VIEW    COMPARISON:  01/18/2014    FINDINGS:  Persistent moderate right pleural effusion, with persistent  consolidation/ atelectasis in the right lower lung. Left lung  remains clear. Heart size difficult to assess due to adjacent  opacity. Vascular clips in the right upper abdomen. .    Visualized skeletal structures are unremarkable.     IMPRESSION:  Persistent moderate right pleural effusion and  adjacent  consolidation/atelectasis.      Electronically Signed    By: Arne Cleveland M.D.    On: 01/20/2014 12:44       Verified By: Kandis Cocking, M.D.,    04-Apr-15 12:53, KUB - Kidney Ureter Bladder  KUB - Kidney Ureter Bladder  REASON FOR EXAM:    nausea  COMMENTS:  PROCEDURE: DXR - DXR KIDNEY URETER BLADDER  - Jan 21 2014 12:53PM     CLINICAL DATA:  Nausea    EXAM:  ABDOMEN - 1 VIEW    COMPARISON:  None.    FINDINGS:  The bowel gas pattern is normal. No radio-opaque calculi or other  significant radiographic abnormality are seen. Right-sided pleural  effusion is again noted.     IMPRESSION:  Right-sided pleural effusion.      Electronically Signed    By: Inez Catalina M.D.    On: 01/21/2014 15:19         Verified By: Everlene Farrier, M.D.,    21-Jun-15 01:18, Chest PA and Lateral  Chest PA and Lateral  REASON FOR EXAM:    Chest Pain  COMMENTS:       PROCEDURE: DXR - DXR CHEST PA (OR AP) AND LATERAL  - Apr 09 2014  1:18AM     CLINICAL DATA:  Chest pain weakness    EXAM:  CHEST  2 VIEW    COMPARISON:  12/12/2013    FINDINGS:  Left-sided defibrillatornoted. Stable enlarged cardiac silhouette.  No infiltrate or pneumothorax. There is a small right effusion. No  pulmonary edema.     IMPRESSION:  Cardiomegaly and small right effusion.  No pulmonary edema.      Electronically Signed    By: Suzy Bouchard M.D.    On: 04/09/2014 01:46         Verified By: Rennis Golden, M.D.,    01-Feb-16 03:58, Chest PA and Lateral  Chest PA and Lateral  REASON FOR EXAM:    syncope. Hx of chf  COMMENTS:       PROCEDURE: DXR - DXR CHEST PA (OR AP) AND LATERAL  - Nov 20 2014  3:58AM     CLINICAL DATA:  Syncope.  History of CHF.    EXAM:  CHEST  2 VIEW    COMPARISON:  04/09/2014    FINDINGS:  Left-sidedpacemaker in place, the lead is unchanged in position  compared to prior, allowing for differences in technique. Decreased  cardiomegaly and  resolution of previous right pleural effusion from  prior exam. There is no consolidation. No pneumothorax. Minimal  atelectasis at the left lung base. Pulmonary vasculature is normal.  No acute osseous abnormality.     IMPRESSION:  Decreased cardiomegaly. Resolution of previous right pleural  effusion. Minimal atelectasis at the left lung base.      Electronically Signed    By: Jeb Levering M.D.    On: 11/20/2014 04:12       Verified By: Rollene Fare. Marisue Humble, M.D.,  [place]:    23-Feb-15 11:36, US Kidney Bilateral  US Kidney Bilateral  REASON FOR EXAM:    worsening renal function.  COMMENTS:       PROCEDURE: [place]  - US KIDNEY  - Dec 12 2013 11:36AM     CLINICAL DATA:  Worsening renal function.    EXAM:  RENAL/URINARY TRACT ULTRASOUND COMPLETE    COMPARISON:  US RENAL KIDNEY dated 11/08/2013    FINDINGS:  Right Kidney:  Length: 12.1 cm. Minimally diffuse increased echogenicity cannot be  excluded. No hydronephrosis. No focal mass.    Left Kidney:    Length: 11.5 cm. Pelvic diffuse increased echogenicity cannot be  excluded. No hydronephrosis or mass.    Bladder:    Bladder is nondistended.    Mild ascites.     IMPRESSION:  1. Possible slight increased echogenicity diffusely  throughout both  kidneys. Medical renal disease cannot be excluded. No hydronephrosis  or focal renal abnormality. Bladder is not distended.  2. Mild ascites.      Electronically Signed    By: Marcello Moores  Register    On: 12/12/2013 13:44         Verified By: Osa Craver, M.D., MD    03-Apr-15 12:52, [place] Color Flow Doppler Low Extrem Bilat (Legs)  [place] Color Flow Doppler Low Extrem Bilat (Legs)  REASON FOR EXAM:    edemas and SOB  COMMENTS:       PROCEDURE: [place]  - US DOPPLER LOW EXTR BILATERAL  - Jan 20 2014 12:52PM     CLINICAL DATA:  Edema, shortness of Breath    EXAM:  BILATERAL LOWER EXTREMITY VENOUS DOPPLER ULTRASOUND    TECHNIQUE:  Gray-scale sonography with compression, as  well as color and duplex  ultrasound, were performed to evaluate the deep venous system from  the level of the common femoral vein through the popliteal and  proximal calf veins.  COMPARISON:  None    FINDINGS:  On the right, there is incomplete compressibility of the femoral  vein throughout its length with eccentric thrombus identified. There  is eccentric thrombus in the popliteal vein resulting in incomplete  compressibility. There is antegrade flow through these segments.  There is incompletely occlusive thrombus in the profunda femoral  vein. Common femoral vein is widely patent, uninvolved by thrombus.  Saphenofemoral junction patent. Augmentation was not attempted.  Segments of the right greater saphenous vein in the midthigh, knee,  and calf are thrombosed and noncompressible.    On the left, Normal compressibility of the common femoral,  superficial femoral, and popliteal veins, as well as the proximal  calf veins. No filling defects to suggest DVT on grayscale or color  Doppler imaging. Doppler waveforms show normal direction of venous  flow, normal respiratory phasicity and response to augmentation.     IMPRESSION:  1. Nonocclusive DVT in right profunda femoral, superficial femoral,  and popliteal veins.  2. Superficial thrombophlebitis involving right greater saphenous  vein.  3. No left  lower extremity deep vein thrombosis.      Electronically Signed    By: Arne Cleveland M.D.    On: 01/20/2014 12:57     Verified By: Kandis Cocking, M.D.,    04-Apr-15 08:27, US Abdomen General Survey  US Abdomen General Survey  REASON FOR EXAM:    thrombocytopenia, assess for splenomegaly.  COMMENTS:       PROCEDURE: [place]  - US ABDOMEN GENERAL SURVEY  - Jan 21 2014  8:27AM     CLINICAL DATA:  Thrombocytopenia    EXAM:  ULTRASOUND ABDOMEN COMPLETE    COMPARISON:  None.    FINDINGS:  Gallbladder:  Surgically removed    Common bile duct:    Diameter: 6.4  mm.    Liver:    Scattered cysts are noted in the inferior aspect of the right lobe  of the liver. No other focal mass lesion is noted.    IVC:    No abnormality visualized.  Pancreas:    Not well visualized due to overlying bowel gas.    Spleen:    Not significantly enlarged. A small amount of fluid is noted in the  splenic hilum    Right Kidney:    Length: 10.4 cm.. Echogenicity within normal limits. No mass or  hydronephrosis visualized.    Left Kidney:  Length: 10.9  cm.. Echogenicity within normal limits. No mass or  hydronephrosis visualized.    Abdominal aorta:    No aneurysm visualized.    Other findings:    Mild free fluid is noted within the pelvis. Additionally there is a  large right-sided pleural effusion.     IMPRESSION:  Mild ascites.  No sizable pocket to allow for paracentesis is noted.  Normal appearing spleen with the exception of the ascites adjacent  to the spleen.    Hepatic cysts.      Electronically Signed    By: Inez Catalina M.D.    On: 01/21/2014 08:38         Verified By: Everlene Farrier, M.D.,    [phone] 09:23, US Abdomen General Survey  US Abdomen General Survey  REASON FOR EXAM:    Dr Roslynn Amble [phone] Eval [place]   Splenomegaly  Liver Spleen  Dept ...  COMMENTS:       PROCEDURE: [place]  - US ABDOMEN GENERAL SURVEY  - Sep 04 2014  9:23AM     CLINICAL DATA:  History of hepatocellular carcinoma evaluate for  splenomegaly    EXAM:  ULTRASOUND ABDOMEN COMPLETE    COMPARISON:  None.    FINDINGS:  Gallbladder: Surgically absent    Common bile duct: Diameter: 4.6 mm    Liver: . There 2 cysts in the right hepatic lobe. One measures 1.1  cm in greatest dimension in the second measures 1.5 cm in greatest  dimension. There is no intrahepatic ductal dilation.    IVC: No abnormality visualized.    Pancreas: Obscured by bowel gas    Spleen: Size and appearance within normal limits. 8.7 cm in  maximal  dimension  Right Kidney: Length: 12.2 cm. Echogenicity within normal limits. No  mass or hydronephrosis visualized.    Left Kidney: Length: 11.6 cm. Echogenicity within normal limits. No  mass or hydronephrosis visualized.    Abdominal aorta: No aneurysm visualized. Evaluation is limited by  bowel gas    Other findings: There is a tiny amount of ascites.     IMPRESSION:  1. There are tiny hepatic cysts in the right lobe. No solid hepatic  masses are demonstrated.  2. The spleen is normal in size and echotexture.  3. There is a small amount of ascites.      Electronically Signed    By: David  [place]    On: 09/04/2014 09:47         Verified By: DAVID A. [place], M.D., MD    01-Feb-16 03:41, US Carotid Doppler Bilateral  US Carotid Doppler Bilateral  REASON FOR EXAM:    near syncope  COMMENTS:       PROCEDURE: [place]  - US CAROTID DOPPLER BILATERAL  - Nov 20 2014  3:41AM     CLINICAL DATA:  74 year old male with near syncope    EXAM:  BILATERAL CAROTID DUPLEX ULTRASOUND    TECHNIQUE:  Pearline Cables scale imaging, color Doppler and duplex ultrasound were  performed of bilateral carotid and vertebral arteries in the neck.    COMPARISON:  CT scan of the head 11/19/2014 ; prior carotid Doppler  ultrasound 02/06/2012    FINDINGS:  Criteria: Quantification of carotid stenosis is based on velocity  parameters that correlate the residual internal carotid diameter  with NASCET-based stenosis levels, using the diameter of the distal  internal carotid lumen as the denominator for stenosis measurement.    The following velocity measurements were obtained:  RIGHT    ICA:  100/21 cm/sec    CCA:  18/2 cm/sec  SYSTOLIC ICA/CCA RATIO:  1.6    DIASTOLIC ICA/CCA RATIO:  1.4    ECA:  60 cm/sec    LEFT    ICA:  132/35 cm/sec    CCA:  99/37 cm/sec    SYSTOLIC ICA/CCARATIO:  2.1    DIASTOLIC ICA/CCA RATIO:  3.0  ECA:  77 cm/sec    RIGHT CAROTID ARTERY: Heterogeneous,  partially calcified and  slightly irregular atherosclerotic plaque in the distal common  carotid artery extending into the proximal internal carotidartery.  By peak systolic velocity criteria, the estimated stenosis remains  less than 50%.    RIGHT VERTEBRAL ARTERY:  Patent with normal antegrade flow.    LEFT CAROTID ARTERY: Bulky heterogeneous but smooth atherosclerotic  plaque beginning in theinternal carotid bulb and extending through  the mid internal carotid artery. Peak systolic velocity criteria,  the estimated stenosis falls within the 50- 69% range.  LEFT VERTEBRAL ARTERY:  Not visualized.     IMPRESSION:  1. 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  2. Mild (less than 50% diameter) stenosis of the proximal right  internal carotid artery.  3. The left vertebral artery is not visualized and may be occluded  or hypoplastic.  4. Unremarkable right vertebral artery.  Signed,    Criselda Peaches, MD  [facility]    [facility]      Electronically Signed    By: Jacqulynn Cadet M.D.    On: 11/20/2014 07:47         Verified By: Criselda Peaches, M.D.,  [place]:    20-Feb-15 18:46, Chest Portable Single View  PACS Image    21-Feb-15 06:12, Chest Portable Single View  PACS Image    23-Feb-15 11:07, Chest PA and Lateral  PACS Image    23-Feb-15 11:36, US Kidney Bilateral  PACS Image    01-Apr-15 02:12, Chest Portable Single View  PACS Image    03-Apr-15 12:42, Chest Portable Single View  PACS Image    03-Apr-15 12:52, [place] Color Flow Doppler Low Extrem Bilat (Legs)  PACS Image    03-Apr-15 13:54, Lung VQ Scan - Nuc Med  PACS Image    04-Apr-15 08:27, US Abdomen General Survey  PACS Image    04-Apr-15 12:53, KUB - Kidney Ureter Bladder  PACS Image    21-Jun-15 01:18, Chest PA and Lateral  PACS Image     31-Jan-16 22:26, CT Head Without Contrast  PACS Image    01-Feb-16 03:41, US Carotid Doppler Bilateral  PACS Image    01-Feb-16 03:58, Chest PA and Lateral  PACS Image  CT:    31-Jan-16 22:26, CT Head Without Contrast  CT Head Without Contrast  REASON FOR EXAM:    sudden onset dizziness and weakness  COMMENTS:       PROCEDURE: CT  - CT HEAD WITHOUT CONTRAST  - Nov 19 2014 10:26PM     CLINICAL DATA:  Acute onset weakness and near syncope at 7:30 p.m..  Pale, diaphoresis.    EXAM:  CT HEAD WITHOUT CONTRAST    TECHNIQUE:  Contiguous axial images were obtained from the base of the skull  through the vertex without intravenous contrast.  COMPARISON:  CT of the head February 06, 2012    FINDINGS:  The ventricles and sulci are normal for age. No intraparenchymal  hemorrhage, mass effect nor midline shift. Patchy supratentorial  white matter hypodensities are within normal range for patient's age  and though non-specific suggest sequelae of chronic small vessel  ischemic disease, relatively unchanged. No acute large vascular  territory infarcts.    No abnormal extra-axial fluid collections. Basal cisterns are  patent. Mild calcific atherosclerosis of the carotid siphons and  included vertebral arteries.    No skull fracture. The included ocular globes and orbital contents  are non-suspicious. The mastoid aircells and included paranasal  sinuses are well-aerated. Sub occipital scalp scarring.     IMPRESSION:  No acute intracranial process.    Involutional changes. Mild to moderate whitematter changes can be  seen with chronic small vessel ischemic disease, relatively  unchanged.      Electronically Signed    By: Elon Alas    On: 11/19/2014 23:04     Verified By: Ricky Ala, M.D.,  Nuclear Med:    03-Apr-15 13:54, Lung VQ Scan - Nuc Med  Lung VQ Scan - Nuc Med  REASON FOR EXAM:    SOB chest pain elevated DDimer  COMMENTS:       PROCEDURE: NM  - NM VQ  LUNG SCAN  - Jan 20 2014  1:54PM     CLINICAL DATA:  Shortness of breath and chest pain    EXAM:  NUCLEAR MEDICINE VENTILATION - PERFUSION LUNG SCAN    TECHNIQUE:  Ventilation images were obtained in multiple projections using  inhaled aerosol technetium 99 M DTPA. Perfusion images were obtained  in multiple projections after intravenous injection of Tc-72mMAA.  RADIOPHARMACEUTICALS:  41.06 mCi Tc-949mTPAaerosol and 4.31 mCi  Tc-9931mA    COMPARISON:  Chest x-ray from earlier in the same day.    FINDINGS:  Ventilation: There ventilation defects identified inferiorly on the  right corresponding to the known infiltrate and effusion seen on  recent chest x-ray. No other ventilation defects are noted.    Perfusion: Decreased ventilation is noted in the right lung base  consistent with the known effusion and focal infiltrate. No other  perfusion defects are noted     IMPRESSION:  Defect related to the known right effusion and infiltrate. No  findings to suggest pulmonary embolism are noted.      Electronically Signed    By: MarInez CatalinaD.    On: 01/20/2014 13:55         Verified By: MAREverlene Farrier.D.,   ASSESSMENT AND PLAN:  Assessment/Admission Diagnosis near syncope Mild right ICA stensois, moderate left ICA stenosis. hyperkalemia Many other medical issues as listed above including CKD stage III and CHF with reduced EF.   Plan I doubt his carotid disease was the cause of his near syncope.  It was more likely dehydration in relation to CKD and CHF.  His Hyperkalemia is better.  He feels well and wants to go home.   Work up included a head CT which was normal and then a carotid duplex today.  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 He is already on anticoagulation and can not tolerate Statins.  Would not change medical therapy. Degree of stenosis is below threshold for repair, and this will be  followed as an outpatient. Have discussed with patient who voices his understanding RTC 6 weeks Other medical issues mangaged by primary service or outpatient physicians.   level 4 consult   Electronic  Signatures: Algernon Huxley (MD)  (Signed 01-Feb-16 15:58)  Authored: Chief Complaint and History, PAST MEDICAL/SURGICAL HISTORY, ALLERGIES, HOME MEDICATIONS, Family and Social History, Review of Systems, Physical Exam, LABS, RADIOLOGY, Assessment and Plan   Last Updated: 01-Feb-16 15:58 by Algernon Huxley (MD)

## 2015-02-18 NOTE — H&P (Signed)
PATIENT NAME:  Robert Bentley, Robert Bentley MR#:  [id number] DATE OF BIRTH:  [birth year]  DATE OF ADMISSION:  11/20/2014  REFERRING PHYSICIAN: Gretchen Short. Beather Arbour, MD  PRIMARY CARE PHYSICIAN: Cletis Athens, MD  PRIMARY CARDIOLOGIST: Isaias Cowman, MD  INFECTION DISEASES: [facility] Diseases- [facility]  HEPATOLOGY: [facility]   ADMITTING PHYSICIAN: Juluis Mire, MD  CHIEF COMPLAINT: Dizziness with weakness with near syncope.   HISTORY OF PRESENT ILLNESS: A 74 year old Caucasian male with a past medical history of multiple medical problems including coronary artery disease,  history of atrial fibrillation, status post AICD on chronic anticoagulation, history of DVT, chronic kidney disease stage III, diabetes mellitus type 2, sudden cardiac arrest history, history of HIV and hepatitis C, chronic thrombocytopenia. Presents with complaints of episode of dizziness with associated generalized weakness and diaphoresis with near syncope around 7:30 p.m. last night. EMS was called on scene and found the patient with positive orthostatic vital signs and was given IV fluids, normal saline, following which his symptoms started improving. He was brought to the Emergency Room for further evaluation.   The patient denies having any chest pain. No shortness of breath. No nausea, no vomiting. No diarrhea. Denies any focal weakness or numbness. In the Emergency Room, the patient was evaluated by the ED physician and was found to be dehydrated and was given IV fluids, normal saline, following which his blood pressure is stable. Further workup revealed hyperkalemia with a potassium of 6.5 and his creatinine is stable at 2.67, his baseline. The rest of his labs are stable at his baseline. The patient denies any complaints at this time. He does not have any further dizziness on near syncopal episodes.   In view of his hyperkalemia, it was suggested by the ED physician to the patient to have IV treatment, namely  insulin D50, calcium gluconate, and soda bicarbonate, but the patient  refused for any of these treatments because of history of severe intolerance with one of these medications. The patient is agreeable to take oral Kayexalate at this time and that is the only medication he wants to take for his hyperkalemia despite explaining the benefits and side effects no. No history of any recent fever, cough. No nausea, no vomiting. No diarrhea. No urinary symptoms.   PAST MEDICAL HISTORY:  1.  Chronic kidney disease, stage III.  2.  Atrial fibrillation, status post AICD on Coumadin.  3.  Diabetes mellitus type 2.  4.  Coronary artery disease status post stent.  5.  History of sudden cardiac arrest.  6.  History of DVT on chronic anticoagulation.  7.  Hepatitis C.  8.  HIV.  9.  Chronic thrombocytopenia.   PAST SURGICAL HISTORY:  1.  Cholecystectomy.  2.  AICD placement.  3.  Cardiac stents.   ALLERGIES:  1.  MORPHINE.  2.  STATINS.   FAMILY HISTORY: Mother died at age 65 from congestive heart failure.   SOCIAL HISTORY: Married, lives with his wife. Quit smoking in 1977. Denies any alcohol or substance abuse. He is retired from the [employer].   HOME MEDICATIONS:  1.  Acetaminophen/hydrocodone 325/10 mg tablet, 1 tablet orally every 6 hours as needed for pain.  2.  Amiodarone 400 mg tablet half tablet daily orally once a day.  3.  Carvedilol 6.25 mg 1 tablet orally once a day.  4.  Coumadin 7.5 mg 1 tablet orally once a day.  5.  Vitamin D3 1000 units 2 tablets orally once a day.  6.  Furosemide 80 mg tablet 1 tablet orally 2 times a day.  7.  Glimepiride 2 mg tablet 1 tablet orally once a day.  8.  Imdur 30 mg tablet extended release 1 tablet orally once a day.  9.  Lactulose 30 mL oral syrup 2 times a day.  10.  Lamivudine 100 mg oral tablet 1.5 tablets orally once a day.  11.  Lumigan ophthalmic drops 1 drop to affected eye once a day.  12.  Nitroglycerin  sublingual tablet 1 tablet sublingual as needed for chest pain.  13.  Norvir 100 mg tablet orally once a day.  14.  Ondansetron 4 mg tablet 1 tablet orally once a day at bedtime as needed.  15.  Plavix 75 mg 1 tablet orally once a day.  16.  Reyataz 300 mg oral capsule 1 capsule orally once a day.  17.  Senokot orally once a day.  18.  Soda bicarbonate 650 mg oral tablet 2 tablets orally 2 times a day.  19.  Xanax 0.5 mg 1 tablet orally 2 times a day as needed.  10.  Zidovudine 300 mg tablet orally 1 tablet every 12 hours.   REVIEW OF SYSTEMS:  CONSTITUTIONAL: Negative for fever. He does have generalized weakness with near syncopal episodes. Currently, generalized weakness is resolved.  EYES: Negative for blurred vision, double vision. No pain. No redness. No discharge. EARS, NOSE, AND THROAT: Negative for tinnitus, ear pain, hearing loss, epistaxis, nasal discharge, difficulty swallowing.  RESPIRATORY: Negative for cough, wheezing, dyspnea, hemoptysis, painful respirations.  CARDIOVASCULAR: Negative for chest pain or palpitations. He did have some dizziness with near syncopal episode, as noted in the history of present illness. No dyspnea on exertion. No pedal edema.  GASTROINTESTINAL: Negative for nausea, vomiting, diarrhea, constipation, abdominal pain, hematemesis, melena, rectal bleeding, or GERD symptoms.  GENITOURINARY: Negative for dysuria, frequency, dysuria, or hematuria.  ENDOCRINE: Negative for polyuria, nocturia, heat or cold intolerance.  HEMATOLOGIC AND LYMPHATIC: Negative for easy bruising, bleeding, or swollen glands. INTEGUMENTARY: Positive for chronic dermatitis of right lower extremity.  MUSCULOSKELETAL: Negative for neck pain or back pain. No arthritis. No gout.  NEUROLOGICAL: Negative for focal weakness or numbness. No history of CVA, TIA, or seizure disorder.  PSYCHIATRIC: Negative for anxiety, insomnia, or depression.   PHYSICAL EXAMINATION:  VITAL SIGNS: Temperature  98.2 degrees Fahrenheit, pulse rate 56 per minute, respirations 18 per minute, blood pressure 137/82, O2 saturation 100% on room air.  GENERAL: Well developed, well nourished, alert, in no acute distress, comfortably resting in the bed.  HEAD: Atraumatic, normocephalic.  EYES: Pupils are equal, react to light and accommodation. No conjunctival pallor. No scleral icterus. Extraocular movements are intact.  NOSE: No drainage. No lesions.  EARS: No drainage. No external lesions are seen.  ORAL CAVITY: No mucosal lesions. No exudates.  NECK: Supple. No JVD. No thyromegaly. No carotid bruit. Range of motion of the neck within normal limits.  RESPIRATORY: Good respiratory effort. Not using accessory muscles of respiration. Bilateral vesicular breath sounds present. No rales or rhonchi.  CARDIOVASCULAR: S1, S2 regular. No murmurs, gallops, or clicks. Peripheral pulses equal at carotid, femoral, and pedal pulses. No peripheral edema. GASTROINTESTINAL: Abdomen is soft and nontender. No hepatosplenomegaly. No masses. No rigidity. No guarding. Bowel sounds present and equal in all 4 quadrants.  GENITOURINARY: Deferred.  MUSCULOSKELETAL: No joint tenderness or effusion. Range of motion is adequate. Strength and tone equal bilaterally.  SKIN INSPECTION: Chronic dermatosis of right lower extremity. Otherwise no  obvious wounds.  LYMPHATIC: No cervical lymphadenopathy.  VASCULAR: Good dorsalis pedis and posterior tibial pulses.  NEUROLOGIC: Alert, awake, and oriented x 3. Cranial nerves II through XII grossly intact. No sensory deficit. Motor strength 5/5 in both upper and lower extremities. Deep tendon reflexes 2+, bilaterally symmetrical. Plantars downgoing.  PSYCHIATRIC: Alert, awake, and oriented x 3. Judgment and insight adequate. Memory and mood within normal limits.   LABORATORY DATA: Serum glucose 245, BUN 41, creatinine 2.67, sodium 134, potassium 6.4, serum chloride 102, bicarbonate 28, total calcium  8.3. Troponin less than 0.02. WBC 5.7, hemoglobin 11.2, hematocrit 33.6, platelet count 71,000.   Urinalysis: No bacteria.   IMAGING STUDIES: CT of the head, noncontrast study: No acute intracranial process.   EKG: Sinus bradycardia with left bundle branch block, tall T waves.   ASSESSMENT AND PLAN: A 74 year old Caucasian male with a history of multiple medical problems, presents with dizziness, generalized weakness, diaphoresis and near syncopal episode. Was found to be with positive orthostatic vitals  per EMS. Workup revealed dehydration and hyperkalemia. The patient was admitted for further management.  1.  Near syncope, with positive orthostatic vitals likely secondary due to dehydration, rule out acute coronary syndrome, rule out arrhythmias. CT head negative for any acute CNS pathology.  Plan: Admit to telemetry, IV hydration, cycle cardiac enzymes, aspirin, beta blocker, carotid Dopplers, orthostatic vital signs. NO STATINS BECAUSE OF HISTORY OF ALLERGIES.  2.  Hyperkalemia, history of chronic kidney disease, stage III. The patient admits noncompliance with diet. The patient is stable clinically. Plan: Telemetry monitoring, Kayexalate p.o. Patient refuses IV treatment for hyperkalemia because of history of bad reaction with one of the IV agents in the past. Follow BMP.  3.  Chronic kidney disease stage III. Creatinine stable at baseline for 2.6. Monitor BMP.  4.  Coronary artery disease status post stent, stable clinically. No symptoms at present. Continue home medications, cycle cardiac enzymes because of near syncope.  5.  History of ischemic cardiomyopathy, recent ejection fraction per echocardiogram done in June 2015, 25% to 30%. Stable clinically. Continue home medications.  6.  History of chronic atrial fibrillation, status post AICD, patient on Coumadin, stable clinically. Continue home medications. Check ProTime, INR.  7.  Diabetes mellitus type 2, stable on home medications.  Continue home medications sliding-scale insulin. Monitor blood sugars.  8.  History of deep vein thrombosis on Coumadin, stable. Monitor ProTime, INR.  9.  HIV on medications per [facility], stable clinically. Continue home medications.  10.  Hepatitis C under the care of [place]. Patient to start treatment shortly. The patient is stable clinically. Check LFTs and we will monitor closely.  11.  Chronic thrombocytopenia likely secondary due to HIV and hepatitis C, stable. Monitor CBC.  12.  Deep vein thrombosis prophylaxis. Coumadin.  13.  Gastrointestinal prophylaxis: PPI.   CODE STATUS: Full code.   TIME SPENT: 55 minutes.    ____________________________ Juluis Mire, MD enr:ah D: 11/20/2014 03:01:19 ET T: 11/20/2014 05:04:20 ET JOB#: [id number]  cc: Juluis Mire, MD, <Dictator> Cletis Athens, MD Isaias Cowman, MD  Juluis Mire MD ELECTRONICALLY SIGNED 11/20/2014 16:53

## 2015-02-18 NOTE — Discharge Summary (Signed)
PATIENT NAME:  Robert Bentley, Robert Bentley MR#:  [id number] DATE OF BIRTH:  [birth year]  DATE OF ADMISSION:  11/20/2014 DATE OF DISCHARGE:  11/20/2014  DISCHARGE DIAGNOSIS: Near syncope likely due to orthostatic vitals, likely due to dehydration.   SECONDARY DIAGNOSIS:  1.  Chronic kidney disease, stage III.  2.  Atrial fibrillation, status post AICD on Coumadin.  3.  Diabetes mellitus type 2.  4.  Coronary artery disease status post stent.  5.  History of sudden cardiac arrest.  6.  History of DVT on chronic anticoagulation.  7.  Hepatitis C.  8.  HIV.  9.  Chronic thrombocytopenia.   CONSULTATIONS:  1.  Nephrology, Dr. Lavonia Dana. 2.  Vascular surgery, Dr. Leotis Pain.   PROCEDURES AND RADIOLOGY: CT scan of the head without contrast on the 31st of January showed no acute intracranial process. Chronic small vessel ischemic disease, relatively unchanged.   Chest x-ray on 1st of February showed decrease cardiomegaly. Minimal atelectasis at the left lung base.   Bilateral carotid Doppler on 1st of February showed moderate (50% to 69%) diameter stenosis of the proximal left internal carotid arteries. Mild stenosis of the proximal right internal carotid artery. Unremarkable right vertebral artery.   MAJOR LABORATORY PANEL: UA on admission was negative. Urine culture had no growth.   HISTORY AND SHORT HOSPITAL COURSE: The patient is a 74 year old male with above-mentioned medical problems who was admitted for dizziness along with weakness and near syncope, thought to be due to orthostatic vitals. Please see Dr. Reddy's dictated history and physical for further details. The patient was started on aggressive IV hydration. The patient was also noted to have hyperkalemia which was improved with treatment. The patient was doing much better by the 1st of February, later in the day. Three sets of troponins are negative. Nephrology, Dr. Juleen China, did see the patient and agreed with the discharge planning. The  patient was also found to have left ICA stenosis. This was not amenable for surgery and vascular surgery did recommend discontinuing anticoagulation at this time. The patient was very adamant to go home and was discharged home in stable condition.   PERTINENT DISCHARGE PHYSICAL EXAMINATION: VITAL SIGNS: On the date of discharge, temperature 97, heart rate 60 per minute, respirations 20 per minute, blood pressure 124/60 and he is saturating 99% on room air.  CARDIOVASCULAR: S1, S2 normal. No murmurs, rubs or gallops.  LUNGS: Clear to auscultation bilaterally. No wheezing, rales, rhonchi or crepitation.  ABDOMEN: Soft, benign.  NEUROLOGIC: Nonfocal examination. All other physical examination remained at baseline.   DISCHARGE MEDICATIONS:   Medication Instructions  norvir 100 mg oral tablet  tab(s) orally once a day   reyataz 300 mg oral capsule  1 cap(s) orally once a day   lumigan 0.01% ophthalmic solution  1 drop(s) to each affected eye once a day (in the evening)   imdur 30 mg oral tablet, extended release  1 tab(s) orally once a day (in the morning)   sodium bicarbonate 650 mg oral tablet  2 tab(s) orally 2 times a day   xanax 0.5 mg oral tablet  1  orally 2 times a day, As Needed   nitroglycerin 0.4 mg sublingual tablet  1 tab(s) sublingual , As needed, chest pain   carvedilol 6.25 mg oral tablet  1 tab(s) orally once a day   lactulose 10 g/15 ml oral syrup  30 milliliter(s) orally 2 times a day, As Needed - for Constipation   zidovudine 300 mg oral  tablet  1 tab(s) orally every 12 hours   lamivudine 100 mg oral tablet  1.5 tab(s) orally once a day   ondansetron 4 mg oral tablet  1 tab(s) orally once a day (at bedtime), As Needed   plavix 75 mg oral tablet  1 tab(s) orally once a day   glimepiride 2 mg oral tablet  2 milligram(s) orally once a day as needed   amiodarone 400 mg oral tablet  .5 tab(s) orally once a day   d3 1000 1000 intl units oral tablet  2 tab(s) orally once a day    coumadin 7.5 mg oral tablet  1 tab(s) orally once a day   senokot   orally once a day   furosemide 80 mg oral tablet  1x tab orally qxam and 1xtab qx other night.   acetaminophen-hydrocodone 325 mg-10 mg oral tablet  1 tab(s) orally every 6 hours, As Needed, pain , As needed, pain     DISCHARGE DIET: Low sodium, low fat, low cholesterol.   DISCHARGE ACTIVITY: As tolerated.   DISCHARGE INSTRUCTIONS AND FOLLOW-UP: The patient was instructed to follow up with his primary care physician, Dr. Cletis Athens, in 1 to 2 weeks. He will need follow-up with vascular surgery, Dr. Leotis Pain, in 2 to 3 weeks.   TOTAL TIME DISCHARGING THIS PATIENT: 45 minutes.  ____________________________ Lucina Mellow. Manuella Ghazi, MD vss:sb D: 11/21/2014 10:47:28 ET T: 11/21/2014 11:34:16 ET JOB#: [id number]  cc: Izyk Marty S. Manuella Ghazi, MD, <Dictator> Cletis Athens, MD Mamie Levers, MD Algernon Huxley, MD [place] MD ELECTRONICALLY SIGNED 11/21/2014 15:29

## 2015-02-21 DIAGNOSIS — M79604 Pain in right leg: Secondary | ICD-10-CM | POA: Diagnosis not present

## 2015-02-21 DIAGNOSIS — I509 Heart failure, unspecified: Secondary | ICD-10-CM | POA: Diagnosis not present

## 2015-02-21 DIAGNOSIS — I4891 Unspecified atrial fibrillation: Secondary | ICD-10-CM | POA: Diagnosis not present

## 2015-02-21 DIAGNOSIS — F4310 Post-traumatic stress disorder, unspecified: Secondary | ICD-10-CM | POA: Diagnosis not present

## 2015-03-08 ENCOUNTER — Ambulatory Visit: Payer: Federal, State, Local not specified - PPO | Admitting: Surgery

## 2015-03-15 ENCOUNTER — Ambulatory Visit: Payer: Federal, State, Local not specified - PPO | Admitting: Surgery

## 2015-03-23 DIAGNOSIS — E8881 Metabolic syndrome: Secondary | ICD-10-CM | POA: Diagnosis not present

## 2015-03-23 DIAGNOSIS — N183 Chronic kidney disease, stage 3 (moderate): Secondary | ICD-10-CM | POA: Diagnosis not present

## 2015-03-23 DIAGNOSIS — R399 Unspecified symptoms and signs involving the genitourinary system: Secondary | ICD-10-CM | POA: Diagnosis not present

## 2015-03-23 DIAGNOSIS — I4891 Unspecified atrial fibrillation: Secondary | ICD-10-CM | POA: Diagnosis not present

## 2015-03-26 DIAGNOSIS — B20 Human immunodeficiency virus [HIV] disease: Secondary | ICD-10-CM | POA: Diagnosis not present

## 2015-04-05 DIAGNOSIS — B182 Chronic viral hepatitis C: Secondary | ICD-10-CM | POA: Diagnosis not present

## 2015-04-05 DIAGNOSIS — N19 Unspecified kidney failure: Secondary | ICD-10-CM | POA: Diagnosis not present

## 2015-04-05 DIAGNOSIS — I5020 Unspecified systolic (congestive) heart failure: Secondary | ICD-10-CM | POA: Diagnosis not present

## 2015-04-05 DIAGNOSIS — B20 Human immunodeficiency virus [HIV] disease: Secondary | ICD-10-CM | POA: Diagnosis not present

## 2015-04-10 ENCOUNTER — Emergency Department: Payer: Medicare Other

## 2015-04-10 ENCOUNTER — Other Ambulatory Visit: Payer: Self-pay

## 2015-04-10 ENCOUNTER — Encounter: Payer: Self-pay | Admitting: Urgent Care

## 2015-04-10 ENCOUNTER — Inpatient Hospital Stay
Admission: EM | Admit: 2015-04-10 | Discharge: 2015-04-12 | DRG: 975 | Disposition: A | Discharge status: Home / Self Care | Payer: Medicare Other | Attending: Internal Medicine | Admitting: Internal Medicine

## 2015-04-10 DIAGNOSIS — R509 Fever, unspecified: Secondary | ICD-10-CM | POA: Diagnosis not present

## 2015-04-10 DIAGNOSIS — I5022 Chronic systolic (congestive) heart failure: Secondary | ICD-10-CM | POA: Diagnosis not present

## 2015-04-10 DIAGNOSIS — A419 Sepsis, unspecified organism: Principal | ICD-10-CM | POA: Diagnosis present

## 2015-04-10 DIAGNOSIS — I429 Cardiomyopathy, unspecified: Secondary | ICD-10-CM | POA: Diagnosis not present

## 2015-04-10 DIAGNOSIS — Z95810 Presence of automatic (implantable) cardiac defibrillator: Secondary | ICD-10-CM

## 2015-04-10 DIAGNOSIS — B20 Human immunodeficiency virus [HIV] disease: Secondary | ICD-10-CM | POA: Diagnosis present

## 2015-04-10 DIAGNOSIS — I129 Hypertensive chronic kidney disease with stage 1 through stage 4 chronic kidney disease, or unspecified chronic kidney disease: Secondary | ICD-10-CM | POA: Diagnosis present

## 2015-04-10 DIAGNOSIS — N189 Chronic kidney disease, unspecified: Secondary | ICD-10-CM

## 2015-04-10 DIAGNOSIS — Z7901 Long term (current) use of anticoagulants: Secondary | ICD-10-CM

## 2015-04-10 DIAGNOSIS — B1920 Unspecified viral hepatitis C without hepatic coma: Secondary | ICD-10-CM | POA: Diagnosis present

## 2015-04-10 DIAGNOSIS — Z79899 Other long term (current) drug therapy: Secondary | ICD-10-CM

## 2015-04-10 DIAGNOSIS — I4891 Unspecified atrial fibrillation: Secondary | ICD-10-CM | POA: Diagnosis present

## 2015-04-10 DIAGNOSIS — R079 Chest pain, unspecified: Secondary | ICD-10-CM | POA: Diagnosis present

## 2015-04-10 DIAGNOSIS — E119 Type 2 diabetes mellitus without complications: Secondary | ICD-10-CM | POA: Diagnosis present

## 2015-04-10 DIAGNOSIS — R791 Abnormal coagulation profile: Secondary | ICD-10-CM | POA: Diagnosis present

## 2015-04-10 DIAGNOSIS — N184 Chronic kidney disease, stage 4 (severe): Secondary | ICD-10-CM | POA: Diagnosis present

## 2015-04-10 DIAGNOSIS — N390 Urinary tract infection, site not specified: Secondary | ICD-10-CM | POA: Diagnosis not present

## 2015-04-10 DIAGNOSIS — Z79891 Long term (current) use of opiate analgesic: Secondary | ICD-10-CM

## 2015-04-10 DIAGNOSIS — Z8249 Family history of ischemic heart disease and other diseases of the circulatory system: Secondary | ICD-10-CM

## 2015-04-10 DIAGNOSIS — Z7982 Long term (current) use of aspirin: Secondary | ICD-10-CM

## 2015-04-10 DIAGNOSIS — N179 Acute kidney failure, unspecified: Secondary | ICD-10-CM | POA: Diagnosis present

## 2015-04-10 DIAGNOSIS — Z7902 Long term (current) use of antithrombotics/antiplatelets: Secondary | ICD-10-CM

## 2015-04-10 DIAGNOSIS — E11649 Type 2 diabetes mellitus with hypoglycemia without coma: Secondary | ICD-10-CM | POA: Diagnosis present

## 2015-04-10 DIAGNOSIS — I25110 Atherosclerotic heart disease of native coronary artery with unstable angina pectoris: Secondary | ICD-10-CM | POA: Diagnosis present

## 2015-04-10 DIAGNOSIS — F419 Anxiety disorder, unspecified: Secondary | ICD-10-CM | POA: Diagnosis present

## 2015-04-10 DIAGNOSIS — R0602 Shortness of breath: Secondary | ICD-10-CM | POA: Diagnosis not present

## 2015-04-10 DIAGNOSIS — F4310 Post-traumatic stress disorder, unspecified: Secondary | ICD-10-CM | POA: Diagnosis present

## 2015-04-10 HISTORY — DX: Post-traumatic stress disorder, unspecified: F43.10

## 2015-04-10 HISTORY — DX: Heart failure, unspecified: I50.9

## 2015-04-10 HISTORY — DX: Human immunodeficiency virus (HIV) disease: B20

## 2015-04-10 HISTORY — DX: Essential (primary) hypertension: I10

## 2015-04-10 HISTORY — DX: Anxiety disorder, unspecified: F41.9

## 2015-04-10 HISTORY — DX: Type 2 diabetes mellitus without complications: E11.9

## 2015-04-10 HISTORY — DX: Unspecified kidney failure: N19

## 2015-04-10 HISTORY — DX: Unspecified viral hepatitis C without hepatic coma: B19.20

## 2015-04-10 LAB — CBC WITH DIFFERENTIAL/PLATELET
Basophils Absolute: 0 10*3/uL (ref 0–0.1)
Basophils Relative: 0 %
EOS ABS: 0 10*3/uL (ref 0–0.7)
EOS PCT: 0 %
HCT: 28.8 % — ABNORMAL LOW (ref 40.0–52.0)
Hemoglobin: 9.8 g/dL — ABNORMAL LOW (ref 13.0–18.0)
Lymphocytes Relative: 8 %
Lymphs Abs: 1.3 10*3/uL (ref 1.0–3.6)
MCH: 40.2 pg — ABNORMAL HIGH (ref 26.0–34.0)
MCHC: 33.9 g/dL (ref 32.0–36.0)
MCV: 118.6 fL — ABNORMAL HIGH (ref 80.0–100.0)
MONO ABS: 1.2 10*3/uL — AB (ref 0.2–1.0)
Monocytes Relative: 7 %
NEUTROS ABS: 14.1 10*3/uL — AB (ref 1.4–6.5)
NEUTROS PCT: 85 %
PLATELETS: 115 10*3/uL — AB (ref 150–440)
RBC: 2.43 MIL/uL — ABNORMAL LOW (ref 4.40–5.90)
RDW: 14.4 % (ref 11.5–14.5)
WBC: 16.7 10*3/uL — ABNORMAL HIGH (ref 3.8–10.6)

## 2015-04-10 LAB — BASIC METABOLIC PANEL
Anion gap: 7 (ref 5–15)
BUN: 59 mg/dL — AB (ref 6–20)
CALCIUM: 8.4 mg/dL — AB (ref 8.9–10.3)
CO2: 28 mmol/L (ref 22–32)
CREATININE: 3.42 mg/dL — AB (ref 0.61–1.24)
Chloride: 100 mmol/L — ABNORMAL LOW (ref 101–111)
GFR, EST AFRICAN AMERICAN: 19 mL/min — AB (ref >60)
GFR, EST NON AFRICAN AMERICAN: 17 mL/min — AB (ref >60)
GLUCOSE: 72 mg/dL (ref 65–99)
Potassium: 5.4 mmol/L — ABNORMAL HIGH (ref 3.5–5.1)
Sodium: 135 mmol/L (ref 135–145)

## 2015-04-10 LAB — URINALYSIS COMPLETE WITH MICROSCOPIC (ARMC ONLY)
BILIRUBIN URINE: NEGATIVE
Bacteria, UA: NONE SEEN
GLUCOSE, UA: NEGATIVE mg/dL
KETONES UR: NEGATIVE mg/dL
NITRITE: NEGATIVE
Protein, ur: >500 mg/dL — AB
SPECIFIC GRAVITY, URINE: 1.013 (ref 1.005–1.030)
SQUAMOUS EPITHELIAL / LPF: NONE SEEN
pH: 7 (ref 5.0–8.0)

## 2015-04-10 LAB — LACTIC ACID, PLASMA: Lactic Acid, Venous: 0.8 mmol/L (ref 0.5–2.0)

## 2015-04-10 LAB — BRAIN NATRIURETIC PEPTIDE: B Natriuretic Peptide: 232 pg/mL — ABNORMAL HIGH (ref 0.0–100.0)

## 2015-04-10 LAB — TROPONIN I

## 2015-04-10 MED ORDER — LACTULOSE 10 GM/15ML PO SOLN: 20.0000 g | Freq: Two times a day (BID) | ORAL | Status: DC | PRN

## 2015-04-10 MED ORDER — NITROGLYCERIN 0.4 MG SL SUBL
SUBLINGUAL_TABLET | SUBLINGUAL | Status: AC
  Administered 2015-04-10: 0.4 mg via SUBLINGUAL
  Filled 2015-04-10: qty 1

## 2015-04-10 MED ORDER — WARFARIN SODIUM 5 MG PO TABS
7.5000 mg | ORAL_TABLET | Freq: Every day | ORAL | Status: DC
Start: 2015-04-10 — End: 2015-04-11
  Administered 2015-04-11: 7.5 mg via ORAL
  Filled 2015-04-10 (×2): qty 1

## 2015-04-10 MED ORDER — ACETAMINOPHEN 650 MG RE SUPP: 650.0000 mg | Freq: Four times a day (QID) | RECTAL | Status: DC | PRN

## 2015-04-10 MED ORDER — NITROGLYCERIN 0.4 MG SL SUBL
0.4000 mg | SUBLINGUAL_TABLET | SUBLINGUAL | Status: DC | PRN
  Administered 2015-04-10: 0.4 mg via SUBLINGUAL

## 2015-04-10 MED ORDER — AMIODARONE HCL 200 MG PO TABS: 200.0000 mg | ORAL_TABLET | Freq: Every day | ORAL | Status: DC

## 2015-04-10 MED ORDER — HYDROCODONE-ACETAMINOPHEN 10-325 MG PO TABS: 1.0000 | ORAL_TABLET | Freq: Four times a day (QID) | ORAL | Status: DC | PRN

## 2015-04-10 MED ORDER — INSULIN ASPART 100 UNIT/ML ~~LOC~~ SOLN: 0.0000 [IU] | Freq: Three times a day (TID) | SUBCUTANEOUS | Status: DC

## 2015-04-10 MED ORDER — ATAZANAVIR SULFATE 150 MG PO CAPS
300.0000 mg | ORAL_CAPSULE | Freq: Every day | ORAL | Status: DC
  Filled 2015-04-10: qty 2

## 2015-04-10 MED ORDER — CEFTRIAXONE SODIUM IN DEXTROSE 20 MG/ML IV SOLN
INTRAVENOUS | Status: AC
Start: 2015-04-10 — End: 2015-04-11
  Filled 2015-04-10: qty 50

## 2015-04-10 MED ORDER — RITONAVIR 100 MG PO TABS
100.0000 mg | ORAL_TABLET | Freq: Every day | ORAL | Status: DC
  Administered 2015-04-11: 100 mg via ORAL
  Filled 2015-04-10 (×2): qty 1

## 2015-04-10 MED ORDER — CARVEDILOL 6.25 MG PO TABS
6.2500 mg | ORAL_TABLET | Freq: Two times a day (BID) | ORAL | Status: DC
  Administered 2015-04-11: 6.25 mg via ORAL
  Filled 2015-04-10 (×3): qty 1

## 2015-04-10 MED ORDER — HEPARIN SODIUM (PORCINE) 5000 UNIT/ML IJ SOLN: 5000.0000 [IU] | Freq: Three times a day (TID) | INTRAMUSCULAR | Status: DC

## 2015-04-10 MED ORDER — NITROGLYCERIN 2 % TD OINT
0.5000 [in_us] | TOPICAL_OINTMENT | Freq: Once | TRANSDERMAL | Status: AC
  Administered 2015-04-10: 0.5 [in_us] via TOPICAL

## 2015-04-10 MED ORDER — SODIUM CHLORIDE 0.9 % IV SOLN
INTRAVENOUS | Status: DC
  Administered 2015-04-11 – 2015-04-12 (×4): via INTRAVENOUS

## 2015-04-10 MED ORDER — LAMIVUDINE 150 MG PO TABS
150.0000 mg | ORAL_TABLET | Freq: Every day | ORAL | Status: DC
  Administered 2015-04-11: 150 mg via ORAL
  Filled 2015-04-10 (×2): qty 1

## 2015-04-10 MED ORDER — ASPIRIN 81 MG PO CHEW
CHEWABLE_TABLET | ORAL | Status: AC
  Administered 2015-04-10: 243 mg via ORAL
  Filled 2015-04-10: qty 4

## 2015-04-10 MED ORDER — VITAMIN D3 25 MCG (1000 UNIT) PO TABS
ORAL_TABLET | Freq: Every day | ORAL | Status: DC
  Administered 2015-04-11: 1000 [IU] via ORAL
  Filled 2015-04-10 (×2): qty 2

## 2015-04-10 MED ORDER — ACETAMINOPHEN 325 MG PO TABS
650.0000 mg | ORAL_TABLET | Freq: Four times a day (QID) | ORAL | Status: DC | PRN
Start: 2015-04-10 — End: 2015-04-12
  Administered 2015-04-11: 650 mg via ORAL
  Filled 2015-04-10: qty 2

## 2015-04-10 MED ORDER — ONDANSETRON HCL 4 MG/2ML IJ SOLN: 4.0000 mg | Freq: Four times a day (QID) | INTRAMUSCULAR | Status: DC | PRN

## 2015-04-10 MED ORDER — SODIUM BICARBONATE 650 MG PO TABS
1300.0000 mg | ORAL_TABLET | Freq: Two times a day (BID) | ORAL | Status: DC
  Administered 2015-04-11 (×3): 1300 mg via ORAL
  Filled 2015-04-10 (×2): qty 2

## 2015-04-10 MED ORDER — SODIUM CHLORIDE 0.9 % IV BOLUS (SEPSIS)
500.0000 mL | Freq: Once | INTRAVENOUS | Status: AC
  Administered 2015-04-10: 500 mL via INTRAVENOUS

## 2015-04-10 MED ORDER — ISOSORBIDE MONONITRATE ER 30 MG PO TB24
30.0000 mg | ORAL_TABLET | Freq: Every day | ORAL | Status: DC
  Filled 2015-04-10: qty 1

## 2015-04-10 MED ORDER — ONDANSETRON HCL 4 MG PO TABS: 4.0000 mg | ORAL_TABLET | Freq: Four times a day (QID) | ORAL | Status: DC | PRN

## 2015-04-10 MED ORDER — NITROGLYCERIN 2 % TD OINT
TOPICAL_OINTMENT | TRANSDERMAL | Status: AC
  Administered 2015-04-10: 0.5 [in_us] via TOPICAL
  Filled 2015-04-10: qty 1

## 2015-04-10 MED ORDER — ALPRAZOLAM 0.5 MG PO TABS: 0.5000 mg | ORAL_TABLET | Freq: Two times a day (BID) | ORAL | Status: DC | PRN

## 2015-04-10 MED ORDER — LATANOPROST 0.005 % OP SOLN
1.0000 [drp] | Freq: Every day | OPHTHALMIC | Status: DC
  Administered 2015-04-11: 1 [drp] via OPHTHALMIC
  Filled 2015-04-10 (×2): qty 2.5

## 2015-04-10 MED ORDER — ZIDOVUDINE 300 MG PO TABS
300.0000 mg | ORAL_TABLET | Freq: Two times a day (BID) | ORAL | Status: DC
  Administered 2015-04-11 (×2): 300 mg via ORAL
  Filled 2015-04-10 (×2): qty 1

## 2015-04-10 MED ORDER — INSULIN ASPART 100 UNIT/ML ~~LOC~~ SOLN: 0.0000 [IU] | Freq: Every day | SUBCUTANEOUS | Status: DC

## 2015-04-10 MED ORDER — ASPIRIN 81 MG PO CHEW
243.0000 mg | CHEWABLE_TABLET | Freq: Once | ORAL | Status: AC
  Administered 2015-04-10: 243 mg via ORAL

## 2015-04-10 MED ORDER — NITROGLYCERIN 0.4 MG SL SUBL: 0.4000 mg | SUBLINGUAL_TABLET | SUBLINGUAL | Status: DC | PRN

## 2015-04-10 MED ORDER — SODIUM CHLORIDE 0.9 % IJ SOLN: 3.0000 mL | Freq: Two times a day (BID) | INTRAMUSCULAR | Status: DC

## 2015-04-10 MED ORDER — CEFTRIAXONE SODIUM IN DEXTROSE 20 MG/ML IV SOLN
1.0000 g | INTRAVENOUS | Status: DC
  Administered 2015-04-10 – 2015-04-11 (×2): 1 g via INTRAVENOUS
  Filled 2015-04-10 (×2): qty 50

## 2015-04-10 MED ORDER — AMIODARONE HCL 200 MG PO TABS
200.0000 mg | ORAL_TABLET | Freq: Every day | ORAL | Status: DC
  Filled 2015-04-10: qty 1

## 2015-04-10 MED ORDER — CLOPIDOGREL BISULFATE 75 MG PO TABS
75.0000 mg | ORAL_TABLET | Freq: Every day | ORAL | Status: DC
  Administered 2015-04-11: 75 mg via ORAL
  Filled 2015-04-10 (×2): qty 1

## 2015-04-10 MED ORDER — ASPIRIN EC 81 MG PO TBEC
81.0000 mg | DELAYED_RELEASE_TABLET | Freq: Every day | ORAL | Status: DC
  Filled 2015-04-10: qty 1

## 2015-04-10 NOTE — ED Notes (Signed)
Pt has chest pain    States it feels like twinges in my chest.  Hx chf.  States sob no worse than usual.  No n/v/d.  No diaphoresis.  Pain 2/10.  Pt alert.  Skin warm and dry.  nsr on monitor.  md at bedside.

## 2015-04-10 NOTE — ED Notes (Signed)
States no chest pain  Waiting on admission.  Iv fluids infusing.    Family with pt. Sinus brady on monitor.

## 2015-04-10 NOTE — ED Notes (Signed)
Patient presents with reports of LEFT side CP that has been going on, intermittently, since last night. Patient reporting PMH significant for CHF - has small vessel that "they couldn't do anything with." (+) Nausea, SOB, and diaphoresis reported. Patient on chronic supplemental oxygen as needed. Patient took Vicodin 10/325, ASA 81mg , Zofran 8mg  PTA.

## 2015-04-10 NOTE — ED Notes (Signed)
Iv started   meds given.   Pain 1/10 now

## 2015-04-10 NOTE — H&P (Signed)
[facility] NAME: Robert Bentley    MR#:  [id number]  DATE OF BIRTH:  21-May-1941   DATE OF ADMISSION:  04/10/2015  PRIMARY CARE PHYSICIAN: Cletis Athens, MD  Cardiologist: Josefa Half  REQUESTING/REFERRING PHYSICIAN: Schaevitz  CHIEF COMPLAINT:   Chief Complaint  Patient presents with  . Chest Pain  . Shortness of Breath    HISTORY OF PRESENT ILLNESS:  Robert Bentley  is a 74 y.o. male with a known history of coronary artery disease, type 2 diabetes non-insulin-requiring, atrial fibrillation presenting with chest pain. Describes acute onset chest pain, retrosternal in location, radiation to left chest, pressure in quality, 8/10 intensity, no worsening or relieving factors. Had associated nausea without emesis. Also describes one day duration of fever. He also attests to having signs and symptoms of BPH including incomplete voiding however denies any change in urinary frequency or dysuria. After receiving nitroglycerin he is now chest pain-free and emergency department  PAST MEDICAL HISTORY:   Past Medical History  Diagnosis Date  . CHF (congestive heart failure)   . Renal failure   . HIV disease   . Hepatitis C   . Diabetes mellitus without complication   . Hypertension   . Anxiety   . PTSD (post-traumatic stress disorder)     PAST SURGICAL HISTORY:   Past Surgical History  Procedure Laterality Date  . Cholecystectomy    . Ep implantable device    . Appendectomy    . Cardiac stenting      SOCIAL HISTORY:   History  Substance Use Topics  . Smoking status: Never Smoker   . Smokeless tobacco: Not on file  . Alcohol Use: No    FAMILY HISTORY:   Family History  Problem Relation Age of Onset  . Heart failure Mother     DRUG ALLERGIES:   Allergies  Allergen Reactions  . Efavirenz Other (See Comments)    Reaction:  Unknown   . Statins Other (See Comments)    Reaction:  Unknown   . Sulfamethizole  Other (See Comments)    Reaction:  Unknown   . Morphine And Related Nausea And Vomiting and Rash    REVIEW OF SYSTEMS:  REVIEW OF SYSTEMS:  CONSTITUTIONAL: Denies fevers, chills, fatigue, weakness.  EYES: Denies blurred vision, double vision, or eye pain.  EARS, NOSE, THROAT: Denies tinnitus, ear pain, hearing loss.  RESPIRATORY: denies cough, shortness of breath, wheezing  CARDIOVASCULAR: Positive chest pain, denies palpitations, edema.  GASTROINTESTINAL: Positive nausea, denies vomiting, diarrhea, abdominal pain.  GENITOURINARY: Denies dysuria, hematuria. Positive incomplete voiding ENDOCRINE: Denies nocturia or thyroid problems. HEMATOLOGIC AND LYMPHATIC: Denies easy bruising or bleeding.  SKIN: Denies rash or lesions.  MUSCULOSKELETAL: Denies pain in neck, back, shoulder, knees, hips, or further arthritic symptoms.  NEUROLOGIC: Denies paralysis, paresthesias.  PSYCHIATRIC: Denies anxiety or depressive symptoms. Otherwise full review of systems performed by me is negative.   MEDICATIONS AT HOME:   Prior to Admission medications   Medication Sig Start Date End Date Taking? Authorizing Provider  ALPRAZolam Duanne Moron) 0.5 MG tablet Take 0.5 mg by mouth 2 (two) times daily as needed for anxiety.   Yes Historical Provider, MD  amiodarone (PACERONE) 400 MG tablet Take 200 mg by mouth daily.   Yes Historical Provider, MD  aspirin EC 81 MG tablet Take 81 mg by mouth every evening.   Yes Historical Provider, MD  atazanavir (REYATAZ) 300 MG capsule Take 300 mg by mouth daily.  Yes Historical Provider, MD  bimatoprost (LUMIGAN) 0.01 % SOLN Place 1 drop into both eyes at bedtime.   Yes Historical Provider, MD  carvedilol (COREG) 6.25 MG tablet Take 6.25 mg by mouth 2 (two) times daily.   Yes Historical Provider, MD  Cholecalciferol (VITAMIN D3) 2000 UNITS TABS Take 1 tablet by mouth daily.   Yes Historical Provider, MD  clopidogrel (PLAVIX) 75 MG tablet Take 75 mg by mouth daily.   Yes  Historical Provider, MD  furosemide (LASIX) 80 MG tablet Take 80 mg by mouth 2 (two) times daily.   Yes Historical Provider, MD  glimepiride (AMARYL) 2 MG tablet Take 2 mg by mouth daily as needed (for high blood sugar).   Yes Historical Provider, MD  HYDROcodone-acetaminophen (NORCO) 10-325 MG per tablet Take 1 tablet by mouth every 6 (six) hours as needed for severe pain.   Yes Historical Provider, MD  isosorbide mononitrate (IMDUR) 30 MG 24 hr tablet Take 30 mg by mouth daily.   Yes Historical Provider, MD  lactulose (CHRONULAC) 10 GM/15ML solution Take 20 g by mouth 2 (two) times daily as needed for mild constipation.   Yes Historical Provider, MD  lamiVUDine (EPIVIR) 150 MG tablet Take 150 mg by mouth daily.   Yes Historical Provider, MD  Multiple Vitamins-Minerals (MULTIVITAMIN PO) Take 1 tablet by mouth daily.   Yes Historical Provider, MD  nitroGLYCERIN (NITROSTAT) 0.4 MG SL tablet Place 0.4 mg under the tongue every 5 (five) minutes as needed for chest pain.   Yes Historical Provider, MD  ondansetron (ZOFRAN) 8 MG tablet Take 8 mg by mouth every 8 (eight) hours as needed for nausea or vomiting.   Yes Historical Provider, MD  ritonavir (NORVIR) 100 MG TABS tablet Take 100 mg by mouth daily.   Yes Historical Provider, MD  sodium bicarbonate 650 MG tablet Take 1,300 mg by mouth 2 (two) times daily.    Yes Historical Provider, MD  warfarin (COUMADIN) 7.5 MG tablet Take 7.5 mg by mouth at bedtime.   Yes Historical Provider, MD  zidovudine (RETROVIR) 300 MG tablet Take 300 mg by mouth 2 (two) times daily.   Yes Historical Provider, MD      VITAL SIGNS:  Blood pressure 133/59, pulse 57, temperature 97.8 F (36.6 C), temperature source Oral, resp. rate 17, height 6\' 1"  (1.854 m), weight 163 lb (73.936 kg), SpO2 93 %.  PHYSICAL EXAMINATION:  VITAL SIGNS: Filed Vitals:   04/10/15 2200  BP: 133/59  Pulse: 57  Temp:   Resp: 17   GENERAL:73 y.o.male currently in no acute distress.  HEAD:  Normocephalic, atraumatic.  EYES: Pupils equal, round, reactive to light. Extraocular muscles intact. No scleral icterus.  MOUTH: Moist mucosal membrane. Dentition intact. No abscess noted.  EAR, NOSE, THROAT: Clear without exudates. No external lesions.  NECK: Supple. No thyromegaly. No nodules. No JVD.  PULMONARY: Clear to ascultation, without wheeze rails or rhonci. No use of accessory muscles, Good respiratory effort. good air entry bilaterally CHEST: Nontender to palpation.  CARDIOVASCULAR: S1 and S2. Regular rate and rhythm. No murmurs, rubs, or gallops. No edema. Pedal pulses 2+ bilaterally.  GASTROINTESTINAL: Soft, nontender, nondistended. No masses. Positive bowel sounds. No hepatosplenomegaly.  MUSCULOSKELETAL: No swelling, clubbing, or edema. Range of motion full in all extremities.  NEUROLOGIC: Cranial nerves II through XII are intact. No gross focal neurological deficits. Sensation intact. Reflexes intact.  SKIN: No ulceration, lesions, rashes, or cyanosis. Skin warm and dry. Turgor intact.  PSYCHIATRIC: Mood, affect within  normal limits. The patient is awake, alert and oriented x 3. Insight, judgment intact.    LABORATORY PANEL:   CBC  Recent Labs Lab 04/10/15 2023  WBC 16.7*  HGB 9.8*  HCT 28.8*  PLT 115*   ------------------------------------------------------------------------------------------------------------------  Chemistries   Recent Labs Lab 04/10/15 2023  NA 135  K 5.4*  CL 100*  CO2 28  GLUCOSE 72  BUN 59*  CREATININE 3.42*  CALCIUM 8.4*   ------------------------------------------------------------------------------------------------------------------  Cardiac Enzymes  Recent Labs Lab 04/10/15 2023  TROPONINI <0.03   ------------------------------------------------------------------------------------------------------------------  RADIOLOGY:  Dg Chest 1 View  04/10/2015   CLINICAL DATA:  Patient with chest pain and shortness of  breath.  EXAM: CHEST  1 VIEW  COMPARISON:  Chest radiograph 11/20/2014  FINDINGS: Monitoring leads overlie the patient. Stable enlarged cardiac and mediastinal contours. No consolidative pulmonary opacities. No pleural effusion or pneumothorax. Stable defibrillator device and lead.  IMPRESSION: No acute cardiopulmonary process.   Electronically Signed   By: Lovey Newcomer M.D.   On: 04/10/2015 20:35    EKG:   Orders placed or performed in visit on 11/20/14  . EKG 12-Lead    IMPRESSION AND PLAN:   74 year old Caucasian gentleman history of coronary artery disease, HIV, type 2 diabetes uncomplicated presenting with chest pain, fever.  1.Chest pain, central: Initiate aspirin and statin therapy, admitted to telemetry, trend cardiac enzymes 3,  if continued elevation will initiate heparin drip ,nitroglycerin when necessary, morphine when necessary, consult cardiology follows with Dr. Josefa Half  2. Acute kidney injury on chronic kidney disease: Hold diuretics, gentle IV fluid hydration, follow renal function and urine output 3. UTI: Currently does not meet sepsis criteria however was febrile at home, ceftriaxone antibiotics coverage follow culture data taper antibiotics accordingly 4. Type 2 diabetes uncomplicated: Hold oral agents at insulin sliding scale with every 6 hours Accu-Cheks number 5. HIV: Continue home medications  6. Venous thromboembolism prophylactic: Therapeutic warfarin, check INR    All the records are reviewed and case discussed with ED provider. Management plans discussed with the patient, family and they are in agreement.  CODE STATUS: Full  TOTAL TIME TAKING CARE OF THIS PATIENT: 45 minutes.    Hower,  Karenann Cai.D on 04/10/2015 at 10:32 PM  Between 7am to 6pm - Pager - [phone]  After 6pm: House Pager: - [phone]  [facility]  Office  [phone]  CC: Primary care physician; Cletis Athens, MD

## 2015-04-10 NOTE — ED Provider Notes (Addendum)
[facility] Emergency Department Provider Note  ____________________________________________  Time seen: Approximately 805 PM  I have reviewed the triage vital signs and the nursing notes.   HISTORY  Chief Complaint Chest Pain and Shortness of Breath    HPI Robert Bentley is a 74 y.o. male with a history of CHF, renal failure and CAD who presents today with 1 day of intermittent chest pain. The patient says that the pain is dull and pressure-like in the left side of his chest radiating to the left arm. He has associated shortness of breath but says that this is his baseline. He says that his shortness of breath is related to his congestive heart failure and is worsened with exertion. He says that the pain right now is a 2 out of 10 and was improved with Vicodin which she took earlier tonight at 7 PM. He says he also took 1 baby aspirin at 7 PM. He says that earlier in the day starting about noon he started having a fever. He says that he had some nausea associated with the fever. He denies any runny nose cough or sore throat. Denies any ear pain. He says that he did have a sick exposure to a young child with similar symptoms several days ago. The patient is HIV positive and compliant with his HIV medications. 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 He says that it has been infected in the past and he knows the symptoms. He has had a wound dressing placed there at the wound center and does not want to take it off. He says that when it is been taken off in the past for examination and is becoming infected. He says he is also compliant with his Plavix. Says that his nitroglycerin was expired today and so he did not take any. Says that he contracted HIV and hepatitis C from a blood transfusion after  a cholecystectomy in the early 80s.   Past Medical History  Diagnosis Date  . CHF (congestive heart failure)   . Renal failure   . HIV disease   . Hepatitis C   . Diabetes mellitus without complication   . Hypertension   . Anxiety   . PTSD (post-traumatic stress disorder)     There are no active problems to display for this patient.   Past Surgical History  Procedure Laterality Date  . Cholecystectomy    . Ep implantable device    . Appendectomy    . Cardiac stenting      Current Outpatient Rx  Name  Route  Sig  Dispense  Refill  . ALPRAZolam (XANAX) 0.5 MG tablet   Oral   Take 0.5 mg by mouth 2 (two) times daily as needed for anxiety.         Marland Kitchen amiodarone (PACERONE) 400 MG tablet   Oral   Take 200 mg by mouth daily.         Marland Kitchen aspirin EC 81 MG tablet   Oral   Take 81 mg by mouth every evening.         Marland Kitchen atazanavir (REYATAZ) 300 MG capsule   Oral   Take 300 mg by mouth daily.         . bimatoprost (LUMIGAN) 0.01 % SOLN   Both  Eyes   Place 1 drop into both eyes at bedtime.         . carvedilol (COREG) 6.25 MG tablet   Oral   Take 6.25 mg by mouth 2 (two) times daily.         . Cholecalciferol (VITAMIN D3) 2000 UNITS TABS   Oral   Take 1 tablet by mouth daily.         . clopidogrel (PLAVIX) 75 MG tablet   Oral   Take 75 mg by mouth daily.         . furosemide (LASIX) 80 MG tablet   Oral   Take 80 mg by mouth 2 (two) times daily.         Marland Kitchen glimepiride (AMARYL) 2 MG tablet   Oral   Take 2 mg by mouth daily as needed (for high blood sugar).         Marland Kitchen HYDROcodone-acetaminophen (NORCO) 10-325 MG per tablet   Oral   Take 1 tablet by mouth every 6 (six) hours as needed for severe pain.         . isosorbide mononitrate (IMDUR) 30 MG 24 hr tablet   Oral   Take 30 mg by mouth daily.         Marland Kitchen lactulose (CHRONULAC) 10 GM/15ML solution   Oral   Take 20 g by mouth 2 (two) times daily as needed for mild constipation.         Marland Kitchen  lamiVUDine (EPIVIR) 150 MG tablet   Oral   Take 150 mg by mouth daily.         . Multiple Vitamins-Minerals (MULTIVITAMIN PO)   Oral   Take 1 tablet by mouth daily.         . nitroGLYCERIN (NITROSTAT) 0.4 MG SL tablet   Sublingual   Place 0.4 mg under the tongue every 5 (five) minutes as needed for chest pain.         Marland Kitchen ondansetron (ZOFRAN) 8 MG tablet   Oral   Take 8 mg by mouth every 8 (eight) hours as needed for nausea or vomiting.         . ritonavir (NORVIR) 100 MG TABS tablet   Oral   Take 100 mg by mouth daily.         . sodium bicarbonate 650 MG tablet   Oral   Take 1,300 mg by mouth 2 (two) times daily.          Marland Kitchen warfarin (COUMADIN) 7.5 MG tablet   Oral   Take 7.5 mg by mouth at bedtime.         . zidovudine (RETROVIR) 300 MG tablet   Oral   Take 300 mg by mouth 2 (two) times daily.           Allergies Efavirenz; Statins; Sulfamethizole; and Morphine and related  No family history on file.  Social History History  Substance Use Topics  . Smoking status: Never Smoker   . Smokeless tobacco: Not on file  . Alcohol Use: No    Review of Systems Constitutional: No fever/chills Eyes: No visual changes. ENT: No sore throat. Cardiovascular: As above Respiratory: As above Gastrointestinal: No abdominal pain.  No nausea, no vomiting.  No diarrhea.  No constipation. Genitourinary: Negative for dysuria. Musculoskeletal: Negative for back pain. Skin: Negative for rash. Neurological: Negative for headaches, focal weakness or numbness.  10-point ROS otherwise negative.  ____________________________________________   PHYSICAL EXAM:  VITAL SIGNS: ED Triage Vitals  Enc Vitals Group     BP 04/10/15 1949 131/54 mmHg     Pulse Rate 04/10/15 1949 73     Resp 04/10/15 1949 20     Temp 04/10/15 1949 97.8 F (36.6 C)     Temp Source 04/10/15 1949 Oral     SpO2 04/10/15 1949 100 %     Weight 04/10/15 1949 163 lb (73.936 kg)     Height  04/10/15 1949 6\' 1"  (1.854 m)     Head Cir --      Peak Flow --      Pain Score 04/10/15 1950 3     Pain Loc --      Pain Edu? --      Excl. in [place]? --     Constitutional: Alert and oriented. Well appearing and in no acute distress. Eyes: Conjunctivae are normal. PERRL. EOMI. Head: Atraumatic. Nose: No congestion/rhinnorhea. Mouth/Throat: Mucous membranes are moist.  Oropharynx non-erythematous. Neck: No stridor.   Cardiovascular: Normal rate, regular rhythm. Grossly normal heart sounds.  Good peripheral circulation. Left side of the chest is tender to palpation over his defibrillator. He says it is always tender here. There is no redness or induration over the defibrillator. There is no fluctuance palpated. Respiratory: Normal respiratory effort.  No retractions. Lungs CTAB. Gastrointestinal: Soft and nontender. No distention. No abdominal bruits. No CVA tenderness. Musculoskeletal: No lower extremity tenderness nor edema.  No joint effusions. Neurologic:  Normal speech and language. No gross focal neurologic deficits are appreciated. Speech is normal. No gait instability. Skin:  Skin is warm, dry and intact. No rash noted. Proper to the bilateral upper extremities consistent with Plavix use. Right lower extremity dressing in place from the wound center. The patient refuses to have me take it off. Psychiatric: Mood and affect are normal. Speech and behavior are normal.  ____________________________________________   LABS (all labs ordered are listed, but only abnormal results are displayed)  Labs Reviewed  CBC WITH DIFFERENTIAL/PLATELET - Abnormal; Notable for the following:    WBC 16.7 (*)    RBC 2.43 (*)    Hemoglobin 9.8 (*)    HCT 28.8 (*)    MCV 118.6 (*)    MCH 40.2 (*)    Platelets 115 (*)    Neutro Abs 14.1 (*)    Monocytes Absolute 1.2 (*)    All other components within normal limits  BASIC METABOLIC PANEL - Abnormal; Notable for the following:    Potassium 5.4 (*)     Chloride 100 (*)    BUN 59 (*)    Creatinine, Ser 3.42 (*)    Calcium 8.4 (*)    GFR calc non Af Amer 17 (*)    GFR calc Af Amer 19 (*)    All other components within normal limits  BRAIN NATRIURETIC PEPTIDE - Abnormal; Notable for the following:    B Natriuretic Peptide 232.0 (*)    All other components within normal limits  URINALYSIS COMPLETEWITH MICROSCOPIC ([facility] ONLY) - Abnormal; Notable for the following:    Color, Urine YELLOW (*)    APPearance CLEAR (*)    Hgb urine dipstick 1+ (*)    Protein, ur >500 (*)    Leukocytes, UA 2+ (*)    All other components within normal limits  CULTURE, BLOOD (ROUTINE X 2)  CULTURE, BLOOD (ROUTINE X 2)  URINE CULTURE  TROPONIN I  LACTIC ACID, PLASMA   ____________________________________________  EKG  ED ECG REPORT I, Doran Stabler,  the attending physician, personally viewed and interpreted this ECG.   Date: 04/10/2015  EKG Time: 1950  Rate: 76  Rhythm: normal sinus rhythm  Axis: Normal axis  Intervals:LVH with QRS widening  ST&T Change: T wave inversions in 1 and aVL. EKG is unchanged from June 2015.  ____________________________________________  RADIOLOGY  Chest x-ray NAD. I personally reviewed the images. ____________________________________________   PROCEDURES    ____________________________________________   INITIAL IMPRESSION / ASSESSMENT AND PLAN / ED COURSE  Pertinent labs & imaging results that were available during my care of the patient were reviewed by me and considered in my medical decision making (see chart for details).  ----------------------------------------- 9:23 PM on 04/10/2015 -----------------------------------------  Patient's pain relieved with nitroglycerin but says now coming back. We'll give Nitropaste. Concerned about patient's renal function as it is decreased from previous. We'll give small fluid bolus because patient with CHF. Also white count elevated in the setting of  fever earlier today. To admit for chest pain with renal failure. Cultures pending. Urine sent. Signed out to Dr. Lavetta Nielsen at 9:20 PM. ____________________________________________   FINAL CLINICAL IMPRESSION(S) / ED DIAGNOSES  Acute chest pain. Acute on chronic renal failure. Acute fever by history. Initial visit.    Orbie Pyo, MD 04/10/15 2124  Possible viral etiology of fever. Patient has known sick contact exposure. Patient lower risk given compliance with HIV meds and CD4 count over 400 with undetectable viral load. Did not order antibiotics at this time.  Orbie Pyo, MD 04/10/15 2125

## 2015-04-11 ENCOUNTER — Inpatient Hospital Stay
Admit: 2015-04-11 | Discharge: 2015-04-11 | Disposition: A | Discharge status: Home / Self Care | Payer: Medicare Other | Attending: Internal Medicine | Admitting: Internal Medicine

## 2015-04-11 DIAGNOSIS — Z8249 Family history of ischemic heart disease and other diseases of the circulatory system: Secondary | ICD-10-CM | POA: Diagnosis not present

## 2015-04-11 DIAGNOSIS — I5022 Chronic systolic (congestive) heart failure: Secondary | ICD-10-CM | POA: Diagnosis present

## 2015-04-11 DIAGNOSIS — I208 Other forms of angina pectoris: Secondary | ICD-10-CM | POA: Diagnosis not present

## 2015-04-11 DIAGNOSIS — I129 Hypertensive chronic kidney disease with stage 1 through stage 4 chronic kidney disease, or unspecified chronic kidney disease: Secondary | ICD-10-CM | POA: Diagnosis present

## 2015-04-11 DIAGNOSIS — Z79899 Other long term (current) drug therapy: Secondary | ICD-10-CM | POA: Diagnosis not present

## 2015-04-11 DIAGNOSIS — N390 Urinary tract infection, site not specified: Secondary | ICD-10-CM | POA: Diagnosis present

## 2015-04-11 DIAGNOSIS — Z79891 Long term (current) use of opiate analgesic: Secondary | ICD-10-CM | POA: Diagnosis not present

## 2015-04-11 DIAGNOSIS — B1920 Unspecified viral hepatitis C without hepatic coma: Secondary | ICD-10-CM | POA: Diagnosis present

## 2015-04-11 DIAGNOSIS — Z7902 Long term (current) use of antithrombotics/antiplatelets: Secondary | ICD-10-CM | POA: Diagnosis not present

## 2015-04-11 DIAGNOSIS — Z7901 Long term (current) use of anticoagulants: Secondary | ICD-10-CM | POA: Diagnosis not present

## 2015-04-11 DIAGNOSIS — E11649 Type 2 diabetes mellitus with hypoglycemia without coma: Secondary | ICD-10-CM | POA: Diagnosis present

## 2015-04-11 DIAGNOSIS — N184 Chronic kidney disease, stage 4 (severe): Secondary | ICD-10-CM | POA: Diagnosis present

## 2015-04-11 DIAGNOSIS — F4310 Post-traumatic stress disorder, unspecified: Secondary | ICD-10-CM | POA: Diagnosis present

## 2015-04-11 DIAGNOSIS — I25110 Atherosclerotic heart disease of native coronary artery with unstable angina pectoris: Secondary | ICD-10-CM | POA: Diagnosis present

## 2015-04-11 DIAGNOSIS — I429 Cardiomyopathy, unspecified: Secondary | ICD-10-CM | POA: Diagnosis present

## 2015-04-11 DIAGNOSIS — I4891 Unspecified atrial fibrillation: Secondary | ICD-10-CM | POA: Diagnosis present

## 2015-04-11 DIAGNOSIS — A419 Sepsis, unspecified organism: Secondary | ICD-10-CM | POA: Diagnosis present

## 2015-04-11 DIAGNOSIS — Z95810 Presence of automatic (implantable) cardiac defibrillator: Secondary | ICD-10-CM | POA: Diagnosis not present

## 2015-04-11 DIAGNOSIS — I209 Angina pectoris, unspecified: Secondary | ICD-10-CM | POA: Diagnosis not present

## 2015-04-11 DIAGNOSIS — Z7982 Long term (current) use of aspirin: Secondary | ICD-10-CM | POA: Diagnosis not present

## 2015-04-11 DIAGNOSIS — I5021 Acute systolic (congestive) heart failure: Secondary | ICD-10-CM | POA: Diagnosis not present

## 2015-04-11 DIAGNOSIS — F419 Anxiety disorder, unspecified: Secondary | ICD-10-CM | POA: Diagnosis present

## 2015-04-11 DIAGNOSIS — N179 Acute kidney failure, unspecified: Secondary | ICD-10-CM | POA: Diagnosis present

## 2015-04-11 DIAGNOSIS — B20 Human immunodeficiency virus [HIV] disease: Secondary | ICD-10-CM | POA: Diagnosis present

## 2015-04-11 DIAGNOSIS — E119 Type 2 diabetes mellitus without complications: Secondary | ICD-10-CM | POA: Diagnosis present

## 2015-04-11 DIAGNOSIS — R079 Chest pain, unspecified: Secondary | ICD-10-CM | POA: Diagnosis present

## 2015-04-11 LAB — CREATININE, SERUM
Creatinine, Ser: 3.26 mg/dL — ABNORMAL HIGH (ref 0.61–1.24)
GFR, EST AFRICAN AMERICAN: 20 mL/min — AB (ref >60)
GFR, EST NON AFRICAN AMERICAN: 18 mL/min — AB (ref >60)

## 2015-04-11 LAB — BASIC METABOLIC PANEL
Anion gap: 6 (ref 5–15)
BUN: 56 mg/dL — AB (ref 6–20)
CHLORIDE: 104 mmol/L (ref 101–111)
CO2: 27 mmol/L (ref 22–32)
Calcium: 8.1 mg/dL — ABNORMAL LOW (ref 8.9–10.3)
Creatinine, Ser: 2.89 mg/dL — ABNORMAL HIGH (ref 0.61–1.24)
GFR calc non Af Amer: 20 mL/min — ABNORMAL LOW (ref >60)
GFR, EST AFRICAN AMERICAN: 24 mL/min — AB (ref >60)
GLUCOSE: 56 mg/dL — AB (ref 65–99)
Potassium: 4.9 mmol/L (ref 3.5–5.1)
Sodium: 137 mmol/L (ref 135–145)

## 2015-04-11 LAB — TROPONIN I
Troponin I: 0.04 ng/mL — ABNORMAL HIGH (ref <0.031)
Troponin I: <0.03 ng/mL (ref <0.031)
Troponin I: <0.03 ng/mL (ref <0.031)

## 2015-04-11 LAB — CBC
HCT: 29.6 % — ABNORMAL LOW (ref 40.0–52.0)
Hemoglobin: 9.9 g/dL — ABNORMAL LOW (ref 13.0–18.0)
MCH: 39.9 pg — ABNORMAL HIGH (ref 26.0–34.0)
MCHC: 33.3 g/dL (ref 32.0–36.0)
MCV: 119.9 fL — ABNORMAL HIGH (ref 80.0–100.0)
Platelets: 104 10*3/uL — ABNORMAL LOW (ref 150–440)
RBC: 2.47 MIL/uL — ABNORMAL LOW (ref 4.40–5.90)
RDW: 14 % (ref 11.5–14.5)
WBC: 15.3 10*3/uL — ABNORMAL HIGH (ref 3.8–10.6)

## 2015-04-11 LAB — GLUCOSE, CAPILLARY
GLUCOSE-CAPILLARY: 122 mg/dL — AB (ref 65–99)
GLUCOSE-CAPILLARY: 72 mg/dL (ref 65–99)
GLUCOSE-CAPILLARY: 142 mg/dL — AB (ref 65–99)
Glucose-Capillary: 56 mg/dL — ABNORMAL LOW (ref 65–99)
Glucose-Capillary: 62 mg/dL — ABNORMAL LOW (ref 65–99)
Glucose-Capillary: 127 mg/dL — ABNORMAL HIGH (ref 65–99)
Glucose-Capillary: 94 mg/dL (ref 65–99)
Glucose-Capillary: 102 mg/dL — ABNORMAL HIGH (ref 65–99)

## 2015-04-11 LAB — PROTIME-INR
INR: 1.35
Prothrombin Time: 16.9 seconds — ABNORMAL HIGH (ref 11.4–15.0)

## 2015-04-11 MED ORDER — ATAZANAVIR SULFATE 300 MG PO CAPS
300.0000 mg | ORAL_CAPSULE | Freq: Every day | ORAL | Status: DC
  Administered 2015-04-11: 300 mg via ORAL
  Filled 2015-04-11 (×2): qty 1

## 2015-04-11 MED ORDER — WARFARIN - PHYSICIAN DOSING INPATIENT: Freq: Every day | Status: DC

## 2015-04-11 MED ORDER — HYDROCODONE-ACETAMINOPHEN 10-325 MG PO TABS: 1.0000 | ORAL_TABLET | Freq: Four times a day (QID) | ORAL | Status: DC | PRN

## 2015-04-11 MED ORDER — AMIODARONE HCL 100 MG PO TABS
200.0000 mg | ORAL_TABLET | Freq: Every day | ORAL | Status: DC
  Filled 2015-04-11: qty 2

## 2015-04-11 MED ORDER — WARFARIN SODIUM 7.5 MG PO TABS: 8.5000 mg | ORAL_TABLET | Freq: Every day | ORAL | Status: DC

## 2015-04-11 MED ORDER — WARFARIN SODIUM 5 MG PO TABS
10.0000 mg | ORAL_TABLET | Freq: Once | ORAL | Status: AC
  Administered 2015-04-11: 10 mg via ORAL

## 2015-04-11 MED ORDER — ISOSORBIDE MONONITRATE ER 30 MG PO TB24
30.0000 mg | ORAL_TABLET | Freq: Every day | ORAL | Status: DC
  Filled 2015-04-11: qty 1

## 2015-04-11 NOTE — Care Management (Signed)
Presents from home.  Independent in all adls but some times he does need some supervision due to his multiple health issues.  His wound on his lower extremity that has been followed by the wound care clinic is almost completely healed. Denies issues accessing medical care and meds.  Receiving IV antibiotics for UTI.

## 2015-04-11 NOTE — Progress Notes (Signed)
HR 57, MD, Willis notified on phone, order received to hold Carvedilol at this time.

## 2015-04-11 NOTE — Progress Notes (Signed)
[name] [place] NAME: Robert Bentley    MR#:  [id number]  DATE OF BIRTH:  08-Mar-1941  SUBJECTIVE:  CHIEF COMPLAINT:   Chief Complaint  Patient presents with  . Chest Pain  . Shortness of Breath   -Known history of CAD, admitted with chest pain. No chest pain at this time. -Troponins are negative but developed a fever of 100.29F this morning.  REVIEW OF SYSTEMS:  Review of Systems  Constitutional: Negative for fever and chills.  Respiratory: Negative for cough, shortness of breath and wheezing.   Cardiovascular: Negative for chest pain and palpitations.  Gastrointestinal: Negative for nausea, vomiting, abdominal pain, diarrhea and constipation.  Genitourinary: Negative for dysuria.  Neurological: Negative for dizziness, seizures and headaches.    DRUG ALLERGIES:   Allergies  Allergen Reactions  . Efavirenz Other (See Comments)    Reaction:  Unknown   . Statins Other (See Comments)    Reaction:  Unknown   . Sulfamethizole Other (See Comments)    Reaction:  Unknown   . Morphine And Related Nausea And Vomiting and Rash    VITALS:  Blood pressure 122/59, pulse 56, temperature 98.3 F (36.8 C), temperature source Oral, resp. rate 18, height 6\' 1"  (1.854 m), weight 73.755 kg (162 lb 9.6 oz), SpO2 99 %.  PHYSICAL EXAMINATION:  Physical Exam  GENERAL:  74 y.o.-year-old patient lying in the bed with no acute distress.  EYES: Pupils equal, round, reactive to light and accommodation. No scleral icterus. Extraocular muscles intact.  HEENT: Head atraumatic, normocephalic. Oropharynx and nasopharynx clear.  NECK:  Supple, no jugular venous distention. No thyroid enlargement, no tenderness.  LUNGS: Normal breath sounds bilaterally, no wheezing, rales,rhonchi or crepitation. No use of accessory muscles of respiration. Decreased bibasilar breath sounds. CARDIOVASCULAR: S1, S2 normal. Systolic murmur present no rubs or  gallops. defibrillator in place ABDOMEN: Soft, nontender, nondistended. Bowel sounds present. No organomegaly or mass.  EXTREMITIES: No pedal edema, cyanosis, or clubbing.  NEUROLOGIC: Cranial nerves II through XII are intact. Muscle strength 5/5 in all extremities. Sensation intact. Gait not checked.  PSYCHIATRIC: The patient is alert and oriented x 3.  SKIN: No obvious rash, lesion, or ulcer.    LABORATORY PANEL:   CBC  Recent Labs Lab 04/10/15 2359  WBC 15.3*  HGB 9.9*  HCT 29.6*  PLT 104*   ------------------------------------------------------------------------------------------------------------------  Chemistries   Recent Labs Lab 04/11/15 0527  NA 137  K 4.9  CL 104  CO2 27  GLUCOSE 56*  BUN 56*  CREATININE 2.89*  CALCIUM 8.1*   ------------------------------------------------------------------------------------------------------------------  Cardiac Enzymes  Recent Labs Lab 04/11/15 1128  TROPONINI <0.03   ------------------------------------------------------------------------------------------------------------------  RADIOLOGY:  Dg Chest 1 View  04/10/2015   CLINICAL DATA:  Patient with chest pain and shortness of breath.  EXAM: CHEST  1 VIEW  COMPARISON:  Chest radiograph 11/20/2014  FINDINGS: Monitoring leads overlie the patient. Stable enlarged cardiac and mediastinal contours. No consolidative pulmonary opacities. No pleural effusion or pneumothorax. Stable defibrillator device and lead.  IMPRESSION: No acute cardiopulmonary process.   Electronically Signed   By: Lovey Newcomer M.D.   On: 04/10/2015 20:35    EKG:   Orders placed or performed in visit on 04/10/15  . EKG 12-Lead  . EKG 12-Lead  . EKG 12-Lead    ASSESSMENT AND PLAN:   74 year old male with past medical history significant for coronary artery disease, systolic CHF EF less than 30%, diabetes mellitus, atrial fibrillation  on Coumadin, status post AICD placement, history of HIV and  hepatitis C through blood transfusion in the past presents to the hospital secondary to chest pain.  #1 chest pain-atypical chest pain, likely noncardiac. -Troponins are negative. Denies any chest pain at this time. -Echocardiogram is pending. -Cardiology consultation. Dr. Saralyn Pilar  is his regular cardiologist -Continue his Imdur and Coreg and statin. As needed nitroglycerin.  #2 early sepsis-with fever and elevated white count. -No blood cultures were done on admission, if spikes again will order blood cultures. -Urine looks like possible UTI. Urine cultures are pending. Continue Rocephin at this time. -HIV seems to be well treated otherwise will need to look for opportunistic infections. Close monitoring needed.  #3 systolic CHF-chronic, well compensated. Slightly on the dry side on admission. So Lasix is held for today. If He is being discharged tomorrow can be restarted. -Not on a sore R due to renal failure  #4 CK D stage IV-creatinine seems to be close to baseline. -Monitor while on a 6.  #5 HIV-continue home medications. Also has hepatitis C in the process of starting treatment in July 2016.  # 6 atrial fibrillation-rate controlled, actually bradycardic this morning. Hold Coreg for today. Patient is on Coumadin -Pharmacy to adjust Coumadin dose. -INR. is subtherapeutic  #7 DVT prophylaxis-already on Coumadin.  All the records are reviewed and case discussed with Care Management/Social Workerr. Management plans discussed with the patient, family and they are in agreement.  CODE STATUS: Full code  TOTAL TIME TAKING CARE OF THIS PATIENT: 37 minutes.   POSSIBLE D/C IN 1 DAY, DEPENDING ON CLINICAL CONDITION.   Gladstone Lighter M.D on 04/11/2015 at 2:12 PM  Between 7am to 6pm - Pager - [phone]  After 6pm go to [email or web address] - password [email or web address] [facility]  Office  [phone]  CC: Primary care physician; Cletis Athens, MD

## 2015-04-11 NOTE — Progress Notes (Signed)
*  PRELIMINARY RESULTS* Echocardiogram 2D Echocardiogram has been performed.  Robert Bentley 04/11/2015, 3:07 PM

## 2015-04-11 NOTE — Progress Notes (Addendum)
Hypoglycemic Event  CBG: 62  Treatment: 15 GM carbohydrate snack  Symptoms: None  Follow-up CBG: Time: 0741 CBG Result: 72, follow up CBG time 0907, result: 127  Possible Reasons for Event: Unknown  Comments/MD notified: No, pt asymptomatic    Robert Bentley, Caryn Bee  Remember to initiate Hypoglycemia Order Set & complete

## 2015-04-11 NOTE — Plan of Care (Signed)
Problem: Phase III Progression Outcomes Goal: Hemodynamically stable Patient was admitted from ER d/t c/o chest pain. On admission patient was A&O X4,  Denied chest  pain, N&V and stated that he feels better. Patient was oriented to the unit and educated on chest pain. Patient home meds was reviewed with ordered meds and sent to pharmacy.Patient has a wound dressing on his right foot/ankle but refused assessment of his feet. Patient is NSR on the monitor with VS WDL for patient. WIll continue to monitor

## 2015-04-11 NOTE — Consult Note (Addendum)
Reason for Consult: angina cardiomyopathy congestive heart Referring Physician:Dr [facility]  hospitalist  cardiologists Dr. Andris Bentley is an 74 y.o. male.  HPI:  Therapy 25-year-old male history of coronary disease diabetes atrial fibrillation congestive heart failure cardiomyopathy. Patient began with chest pain symptoms he has a 10 for short duration he had not had chest pain recently  No vertigo syncope per patient complains of nausea without vomiting. Because of the recent onset of chest pressure he got concerned so he came to the emergency room. Patient denies any heart failure symptoms or palpitations or tachycardia.  Past Medical History  Diagnosis Date  . CHF (congestive heart failure)   . Renal failure   . HIV disease   . Hepatitis C   . Diabetes mellitus without complication   . Hypertension   . Anxiety   . PTSD (post-traumatic stress disorder)     Past Surgical History  Procedure Laterality Date  . Cholecystectomy    . Ep implantable device    . Appendectomy    . Cardiac stenting      Family History  Problem Relation Age of Onset  . Heart failure Mother     Social History:  reports that he has never smoked. He does not have any smokeless tobacco history on file. He reports that he does not drink alcohol or use illicit drugs.  Allergies:  Allergies  Allergen Reactions  . Efavirenz Other (See Comments)    Reaction:  Unknown   . Statins Other (See Comments)    Reaction:  Unknown   . Sulfamethizole Other (See Comments)    Reaction:  Unknown   . Morphine And Related Nausea And Vomiting and Rash    Medications:  Prior to Admission:  Prescriptions prior to admission  Medication Sig Dispense Refill Last Dose  . ALPRAZolam (XANAX) 0.5 MG tablet Take 0.5 mg by mouth 2 (two) times daily as needed for anxiety.   PRN at PRN  . amiodarone (PACERONE) 400 MG tablet Take 200 mg by mouth daily.   04/10/2015 at Unknown time  . aspirin EC 81 MG tablet Take 81 mg  by mouth every evening.   04/09/2015 at Unknown time  . atazanavir (REYATAZ) 300 MG capsule Take 300 mg by mouth daily.   04/10/2015 at Unknown time  . bimatoprost (LUMIGAN) 0.01 % SOLN Place 1 drop into both eyes at bedtime.   04/09/2015 at Unknown time  . carvedilol (COREG) 6.25 MG tablet Take 6.25 mg by mouth 2 (two) times daily.   04/10/2015 at [place]  . Cholecalciferol (VITAMIN D3) 2000 UNITS TABS Take 1 tablet by mouth daily.   04/10/2015 at Unknown time  . clopidogrel (PLAVIX) 75 MG tablet Take 75 mg by mouth daily.   04/10/2015 at Unknown time  . furosemide (LASIX) 80 MG tablet Take 80 mg by mouth 2 (two) times daily.   04/10/2015 at Unknown time  . glimepiride (AMARYL) 2 MG tablet Take 2 mg by mouth daily as needed (for high blood sugar).   PRN at PRN  . HYDROcodone-acetaminophen (NORCO) 10-325 MG per tablet Take 1 tablet by mouth every 6 (six) hours as needed for severe pain.   PRN at PRN  . isosorbide mononitrate (IMDUR) 30 MG 24 hr tablet Take 30 mg by mouth daily.   04/10/2015 at Unknown time  . lactulose (CHRONULAC) 10 GM/15ML solution Take 20 g by mouth 2 (two) times daily as needed for mild constipation.   PRN at PRN  .  lamiVUDine (EPIVIR) 150 MG tablet Take 150 mg by mouth daily.   04/10/2015 at Unknown time  . Multiple Vitamins-Minerals (MULTIVITAMIN PO) Take 1 tablet by mouth daily.   04/10/2015 at Unknown time  . nitroGLYCERIN (NITROSTAT) 0.4 MG SL tablet Place 0.4 mg under the tongue every 5 (five) minutes as needed for chest pain.   PRN at PRN  . ondansetron (ZOFRAN) 8 MG tablet Take 8 mg by mouth every 8 (eight) hours as needed for nausea or vomiting.   04/10/2015 at Unknown time  . ritonavir (NORVIR) 100 MG TABS tablet Take 100 mg by mouth daily.   04/10/2015 at Unknown time  . sodium bicarbonate 650 MG tablet Take 1,300 mg by mouth 2 (two) times daily.    04/10/2015 at Unknown time  . warfarin (COUMADIN) 7.5 MG tablet Take 7.5 mg by mouth at bedtime.   04/09/2015 at Unknown time  .  zidovudine (RETROVIR) 300 MG tablet Take 300 mg by mouth 2 (two) times daily.   04/10/2015 at Unknown time    Results for orders placed or performed during the hospital encounter of 04/10/15 (from the past 48 hour(s))  CBC with Differential     Status: Abnormal   Collection Time: 04/10/15  8:23 PM  Result Value Ref Range   WBC 16.7 (H) 3.8 - 10.6 K/uL   RBC 2.43 (L) 4.40 - 5.90 MIL/uL   Hemoglobin 9.8 (L) 13.0 - 18.0 g/dL   HCT 28.8 (L) 40.0 - 52.0 %   MCV 118.6 (H) 80.0 - 100.0 fL   MCH 40.2 (H) 26.0 - 34.0 pg   MCHC 33.9 32.0 - 36.0 g/dL   RDW 14.4 11.5 - 14.5 %   Platelets 115 (L) 150 - 440 K/uL   Neutrophils Relative % 85 %   Neutro Abs 14.1 (H) 1.4 - 6.5 K/uL   Lymphocytes Relative 8 %   Lymphs Abs 1.3 1.0 - 3.6 K/uL   Monocytes Relative 7 %   Monocytes Absolute 1.2 (H) 0.2 - 1.0 K/uL   Eosinophils Relative 0 %   Eosinophils Absolute 0.0 0 - 0.7 K/uL   Basophils Relative 0 %   Basophils Absolute 0.0 0 - 0.1 K/uL  Basic metabolic panel     Status: Abnormal   Collection Time: 04/10/15  8:23 PM  Result Value Ref Range   Sodium 135 135 - 145 mmol/L   Potassium 5.4 (H) 3.5 - 5.1 mmol/L   Chloride 100 (L) 101 - 111 mmol/L   CO2 28 22 - 32 mmol/L   Glucose, Bld 72 65 - 99 mg/dL   BUN 59 (H) 6 - 20 mg/dL   Creatinine, Ser 3.42 (H) 0.61 - 1.24 mg/dL   Calcium 8.4 (L) 8.9 - 10.3 mg/dL   GFR calc non Af Amer 17 (L) >60 mL/min   GFR calc Af Amer 19 (L) >60 mL/min    Comment: (NOTE) The eGFR has been calculated using the CKD EPI equation. This calculation has not been validated in all clinical situations. eGFR's persistently <60 mL/min signify possible Chronic Kidney Disease.    Anion gap 7 5 - 15  Brain natriuretic peptide     Status: Abnormal   Collection Time: 04/10/15  8:23 PM  Result Value Ref Range   B Natriuretic Peptide 232.0 (H) 0.0 - 100.0 pg/mL  Troponin I     Status: None   Collection Time: 04/10/15  8:23 PM  Result Value Ref Range   Troponin I <0.03 <0.031  ng/mL  Comment:        NO INDICATION OF MYOCARDIAL INJURY.   Lactic acid, plasma     Status: None   Collection Time: 04/10/15  8:23 PM  Result Value Ref Range   Lactic Acid, Venous 0.8 0.5 - 2.0 mmol/L  Blood culture (routine x 2)     Status: None (Preliminary result)   Collection Time: 04/10/15  8:35 PM  Result Value Ref Range   Specimen Description BLOOD    Special Requests NONE    Culture NO GROWTH < 12 HOURS    Report Status PENDING   Blood culture (routine x 2)     Status: None (Preliminary result)   Collection Time: 04/10/15  8:43 PM  Result Value Ref Range   Specimen Description BLOOD    Special Requests NONE    Culture NO GROWTH < 12 HOURS    Report Status PENDING   Urinalysis complete, with microscopic ([facility] only)     Status: Abnormal   Collection Time: 04/10/15  8:43 PM  Result Value Ref Range   Color, Urine YELLOW (A) YELLOW   APPearance CLEAR (A) CLEAR   Glucose, UA NEGATIVE NEGATIVE mg/dL   Bilirubin Urine NEGATIVE NEGATIVE   Ketones, ur NEGATIVE NEGATIVE mg/dL   Specific Gravity, Urine 1.013 1.005 - 1.030   Hgb urine dipstick 1+ (A) NEGATIVE   pH 7.0 5.0 - 8.0   Protein, ur >500 (A) NEGATIVE mg/dL   Nitrite NEGATIVE NEGATIVE   Leukocytes, UA 2+ (A) NEGATIVE   RBC / HPF 0-5 0 - 5 RBC/hpf   WBC, UA TOO NUMEROUS TO COUNT 0 - 5 WBC/hpf   Bacteria, UA NONE SEEN NONE SEEN   Squamous Epithelial / LPF NONE SEEN NONE SEEN   Hyaline Casts, UA PRESENT   Urine culture     Status: None (Preliminary result)   Collection Time: 04/10/15  8:43 PM  Result Value Ref Range   Specimen Description URINE, CLEAN CATCH    Special Requests NONE    Culture TOO YOUNG TO READ    Report Status PENDING   Glucose, capillary     Status: Abnormal   Collection Time: 04/10/15 11:46 PM  Result Value Ref Range   Glucose-Capillary 56 (L) 65 - 99 mg/dL   Comment 1 Notify RN   CBC     Status: Abnormal   Collection Time: 04/10/15 11:59 PM  Result Value Ref Range   WBC 15.3 (H) 3.8 -  10.6 K/uL   RBC 2.47 (L) 4.40 - 5.90 MIL/uL   Hemoglobin 9.9 (L) 13.0 - 18.0 g/dL   HCT 29.6 (L) 40.0 - 52.0 %   MCV 119.9 (H) 80.0 - 100.0 fL   MCH 39.9 (H) 26.0 - 34.0 pg   MCHC 33.3 32.0 - 36.0 g/dL   RDW 14.0 11.5 - 14.5 %   Platelets 104 (L) 150 - 440 K/uL  Creatinine, serum     Status: Abnormal   Collection Time: 04/10/15 11:59 PM  Result Value Ref Range   Creatinine, Ser 3.26 (H) 0.61 - 1.24 mg/dL   GFR calc non Af Amer 18 (L) >60 mL/min   GFR calc Af Amer 20 (L) >60 mL/min    Comment: (NOTE) The eGFR has been calculated using the CKD EPI equation. This calculation has not been validated in all clinical situations. eGFR's persistently <60 mL/min signify possible Chronic Kidney Disease.   Protime-INR     Status: Abnormal   Collection Time: 04/11/15 12:00 AM  Result Value Ref  Range   Prothrombin Time 16.9 (H) 11.4 - 15.0 seconds   INR 1.35   Troponin I (q 6hr x 3)     Status: Abnormal   Collection Time: 04/11/15 12:02 AM  Result Value Ref Range   Troponin I 0.04 (H) <0.031 ng/mL    Comment: READ BACK AND VERIFIED BY TUNJI  BAKARE '@0152'  04/11/15 .Marland KitchenAJO        PERSISTENTLY INCREASED TROPONIN VALUES IN THE RANGE OF 0.04-0.49 ng/mL CAN BE SEEN IN:       -UNSTABLE ANGINA       -CONGESTIVE HEART FAILURE       -MYOCARDITIS       -CHEST TRAUMA       -ARRYHTHMIAS       -LATE PRESENTING MYOCARDIAL INFARCTION       -COPD   CLINICAL FOLLOW-UP RECOMMENDED.   Glucose, capillary     Status: Abnormal   Collection Time: 04/11/15  1:33 AM  Result Value Ref Range   Glucose-Capillary 122 (H) 65 - 99 mg/dL  Basic metabolic panel     Status: Abnormal   Collection Time: 04/11/15  5:27 AM  Result Value Ref Range   Sodium 137 135 - 145 mmol/L   Potassium 4.9 3.5 - 5.1 mmol/L   Chloride 104 101 - 111 mmol/L   CO2 27 22 - 32 mmol/L   Glucose, Bld 56 (L) 65 - 99 mg/dL   BUN 56 (H) 6 - 20 mg/dL   Creatinine, Ser 2.89 (H) 0.61 - 1.24 mg/dL   Calcium 8.1 (L) 8.9 - 10.3 mg/dL   GFR  calc non Af Amer 20 (L) >60 mL/min   GFR calc Af Amer 24 (L) >60 mL/min    Comment: (NOTE) The eGFR has been calculated using the CKD EPI equation. This calculation has not been validated in all clinical situations. eGFR's persistently <60 mL/min signify possible Chronic Kidney Disease.    Anion gap 6 5 - 15  Troponin I (q 6hr x 3)     Status: None   Collection Time: 04/11/15  5:27 AM  Result Value Ref Range   Troponin I <0.03 <0.031 ng/mL    Comment:        NO INDICATION OF MYOCARDIAL INJURY.   Glucose, capillary     Status: Abnormal   Collection Time: 04/11/15  7:23 AM  Result Value Ref Range   Glucose-Capillary 62 (L) 65 - 99 mg/dL  Glucose, capillary     Status: None   Collection Time: 04/11/15  7:41 AM  Result Value Ref Range   Glucose-Capillary 72 65 - 99 mg/dL  Glucose, capillary     Status: Abnormal   Collection Time: 04/11/15  9:05 AM  Result Value Ref Range   Glucose-Capillary 127 (H) 65 - 99 mg/dL  Troponin I (q 6hr x 3)     Status: None   Collection Time: 04/11/15 11:28 AM  Result Value Ref Range   Troponin I <0.03 <0.031 ng/mL    Comment:        NO INDICATION OF MYOCARDIAL INJURY.   Glucose, capillary     Status: None   Collection Time: 04/11/15 11:48 AM  Result Value Ref Range   Glucose-Capillary 94 65 - 99 mg/dL  Glucose, capillary     Status: Abnormal   Collection Time: 04/11/15  4:40 PM  Result Value Ref Range   Glucose-Capillary 102 (H) 65 - 99 mg/dL    Dg Chest 1 View  04/10/2015   CLINICAL DATA:  Patient with chest pain and shortness of breath.  EXAM: CHEST  1 VIEW  COMPARISON:  Chest radiograph 11/20/2014  FINDINGS: Monitoring leads overlie the patient. Stable enlarged cardiac and mediastinal contours. No consolidative pulmonary opacities. No pleural effusion or pneumothorax. Stable defibrillator device and lead.  IMPRESSION: No acute cardiopulmonary process.   Electronically Signed   By: Lovey Newcomer M.D.   On: 04/10/2015 20:35    Review of  Systems  Constitutional: Positive for fever, chills and malaise/fatigue.  HENT: Negative.   Eyes: Negative.   Respiratory: Positive for shortness of breath.   Cardiovascular: Positive for chest pain and palpitations.  Gastrointestinal: Positive for nausea.  Genitourinary: Positive for dysuria, urgency and frequency.  Musculoskeletal: Negative.   Skin: Negative.   Neurological: Positive for weakness.  Psychiatric/Behavioral: Negative.    Blood pressure 122/59, pulse 56, temperature 98.3 F (36.8 C), temperature source Oral, resp. rate 18, height '6\' 1"'  (1.854 m), weight 73.755 kg (162 lb 9.6 oz), SpO2 99 %. Physical Exam  Constitutional: He is oriented to person, place, and time. He appears well-developed and well-nourished.  HENT:  Head: Normocephalic.  Right Ear: External ear normal.  Eyes: EOM are normal. Pupils are equal, round, and reactive to light.  Neck: Normal range of motion. Neck supple.  Cardiovascular: S1 normal, S2 normal and intact distal pulses.  An irregularly irregular rhythm present. Exam reveals gallop and S3.   Murmur heard.  Systolic murmur is present with a grade of 3/6  Respiratory: Effort normal and breath sounds normal.  GI: Soft.  Musculoskeletal: Normal range of motion.  Neurological: He is alert and oriented to person, place, and time.  Skin: Skin is warm.  Psychiatric: He has a normal mood and affect.    Assessment/Plan:  unstable angina  congestive heart failure  renal failure  cardiomyopathy systolic dysfunction  hepatitis-C  diabetes type 2 uncomplicated  hypertension  anxiety  urinary tract infection  atrial fibrillation . PLAN  continue telemetry  follow-up enzymes and EKG  continue anticoagulation for AFib with Coumadin  agree with amiodarone for rhythm control  recommend continue diabetes  with management with  Glimepiride  agree with antibiotic therapy for UTI  continue heart failure management therapy  DVT prophylaxis   recommend advancing anginal medications leg Imdur   I do not recommend cardiac catheterization at this point  once UTI is treated recommend ambulation and hopefully discharge with follow-up with Cardiology as an outpatient   Sherrell Farish D. 04/11/2015, 6:48 PM

## 2015-04-11 NOTE — Consult Note (Signed)
Hypoglycemic Event  CBG: 56  Treatment: 15 GM carbohydrate snack  Symptoms: None  Follow-up CBG: Time:0133 CBG Result:122 Possible Reasons for Event: Unknown  Comments/MD notified:No   [place]  Remember to initiate Hypoglycemia Order Set & complete

## 2015-04-11 NOTE — Progress Notes (Signed)
ANTICOAGULATION CONSULT NOTE - Initial Consult  Pharmacy Consult for warfarin Indication: atrial fibrillation  Allergies  Allergen Reactions  . Efavirenz Other (See Comments)    Reaction:  Unknown   . Statins Other (See Comments)    Reaction:  Unknown   . Sulfamethizole Other (See Comments)    Reaction:  Unknown   . Morphine And Related Nausea And Vomiting and Rash    Patient Measurements: Height: 6\' 1"  (185.4 cm) Weight: 162 lb 9.6 oz (73.755 kg) IBW/kg (Calculated) : 79.9  Vital Signs: Temp: 98.3 F (36.8 C) (06/22 1142) Temp Source: Oral (06/22 1142) BP: 122/59 mmHg (06/22 1142) Pulse Rate: 56 (06/22 1142)  Labs:  Recent Labs  04/10/15 2023 04/10/15 2359 04/11/15 04/11/15 0002 04/11/15 0527 04/11/15 1128  HGB 9.8* 9.9*  --   --   --   --   HCT 28.8* 29.6*  --   --   --   --   PLT 115* 104*  --   --   --   --   LABPROT  --   --  16.9*  --   --   --   INR  --   --  1.35  --   --   --   CREATININE 3.42* 3.26*  --   --  2.89*  --   TROPONINI <0.03  --   --  0.04* <0.03 <0.03    Estimated Creatinine Clearance: 24.5 mL/min (by C-G formula based on Cr of 2.89).   Medical History: Past Medical History  Diagnosis Date  . CHF (congestive heart failure)   . Renal failure   . HIV disease   . Hepatitis C   . Diabetes mellitus without complication   . Hypertension   . Anxiety   . PTSD (post-traumatic stress disorder)     Medications:  Scheduled:  . [START ON 04/12/2015] amiodarone  200 mg Oral Daily  . atazanavir  300 mg Oral Daily  . carvedilol  6.25 mg Oral BID  . cefTRIAXone (ROCEPHIN)  IV  1 g Intravenous Q24H  . cholecalciferol   Oral Daily  . clopidogrel  75 mg Oral Daily  . insulin aspart  0-5 Units Subcutaneous QHS  . insulin aspart  0-9 Units Subcutaneous TID WC  . isosorbide mononitrate  30 mg Oral Daily  . lamiVUDine  150 mg Oral Daily  . latanoprost  1 drop Both Eyes QHS  . ritonavir  100 mg Oral Daily  . sodium bicarbonate  1,300 mg Oral  BID  . sodium chloride  3 mL Intravenous Q12H  . warfarin  10 mg Oral ONCE-1800  . [START ON 04/12/2015] warfarin  8.5 mg Oral Daily  . zidovudine  300 mg Oral BID    Assessment: Patient is a 74 yo male admitted for chest pain.  Patient with history of HIV and hepatitis.  Currently ordered the above listed HIV meds.  Pharmacy consulted to dose warfarin as patient has a history of atrial fibrillation.  Per notes, patient takes warfarin 7.5 mg po daily.    INR upon admission of 1.35 Hgb: 9.9  Goal of Therapy:  INR 2-3 Monitor hemoglobin per policy.   Plan:  Patient takes warfarin 7.5 mg po daily as an outpatient.  INR subtherapeutic.  Ritonavir may decrease the serum concentrations of warfarin.  Therefore, will order warfarin 10 mg po once then increase dosing by ~20% to 8.5 mg po daily.  Will recheck INR in AM.  Murrell Converse, PharmD Clinical  Pharmacist 04/11/2015

## 2015-04-12 DIAGNOSIS — N390 Urinary tract infection, site not specified: Secondary | ICD-10-CM | POA: Insufficient documentation

## 2015-04-12 DIAGNOSIS — R791 Abnormal coagulation profile: Secondary | ICD-10-CM | POA: Diagnosis present

## 2015-04-12 LAB — PROTIME-INR
INR: 1.35
PROTHROMBIN TIME: 16.9 s — AB (ref 11.4–15.0)

## 2015-04-12 LAB — CBC
HEMATOCRIT: 28.1 % — AB (ref 40.0–52.0)
HEMOGLOBIN: 9.4 g/dL — AB (ref 13.0–18.0)
MCH: 40.1 pg — AB (ref 26.0–34.0)
MCHC: 33.3 g/dL (ref 32.0–36.0)
MCV: 120.3 fL — AB (ref 80.0–100.0)
Platelets: 104 10*3/uL — ABNORMAL LOW (ref 150–440)
RBC: 2.33 MIL/uL — ABNORMAL LOW (ref 4.40–5.90)
RDW: 14.7 % — ABNORMAL HIGH (ref 11.5–14.5)
WBC: 9.5 10*3/uL (ref 3.8–10.6)

## 2015-04-12 LAB — BASIC METABOLIC PANEL
Anion gap: 8 (ref 5–15)
BUN: 45 mg/dL — ABNORMAL HIGH (ref 6–20)
CALCIUM: 8.2 mg/dL — AB (ref 8.9–10.3)
CHLORIDE: 105 mmol/L (ref 101–111)
CO2: 26 mmol/L (ref 22–32)
CREATININE: 2.27 mg/dL — AB (ref 0.61–1.24)
GFR calc Af Amer: 32 mL/min — ABNORMAL LOW (ref >60)
GFR calc non Af Amer: 27 mL/min — ABNORMAL LOW (ref >60)
Glucose, Bld: 170 mg/dL — ABNORMAL HIGH (ref 65–99)
Potassium: 5.1 mmol/L (ref 3.5–5.1)
Sodium: 139 mmol/L (ref 135–145)

## 2015-04-12 LAB — GLUCOSE, CAPILLARY: Glucose-Capillary: 98 mg/dL (ref 65–99)

## 2015-04-12 MED ORDER — CEPHALEXIN 500 MG PO TABS: 500.0000 mg | ORAL_TABLET | Freq: Two times a day (BID) | ORAL | Status: AC

## 2015-04-12 MED ORDER — WARFARIN SODIUM 5 MG PO TABS
10.0000 mg | ORAL_TABLET | ORAL | Status: AC
  Administered 2015-04-12: 10 mg via ORAL
  Filled 2015-04-12: qty 2

## 2015-04-12 NOTE — Progress Notes (Signed)
Dr. Volanda Napoleon notified that patient is planning to leave by 10am with or without being seen by a doctor this morning. Dr. Volanda Napoleon said she will round on him shortly.  Robert Bentley

## 2015-04-12 NOTE — Discharge Instructions (Signed)
You will have a doctors appointment tomorrow with Dr. Rebecka Apley to check your coumadin level and for hospital follow up.  Please take the new antibiotics as prescribed.   DIET:  Cardiac diet and Diabetic diet  DISCHARGE CONDITION:  Fair  ACTIVITY:  Activity as tolerated  OXYGEN:  Home Oxygen: No.   Oxygen Delivery: room air  DISCHARGE LOCATION:  home   If you experience worsening of your admission symptoms, develop shortness of breath, life threatening emergency, suicidal or homicidal thoughts you must seek medical attention immediately by calling 911 or calling your MD immediately  if symptoms less severe.  You Must read complete instructions/literature along with all the possible adverse reactions/side effects for all the Medicines you take and that have been prescribed to you. Take any new Medicines after you have completely understood and accpet all the possible adverse reactions/side effects.   Please note  You were cared for by a hospitalist during your hospital stay. If you have any questions about your discharge medications or the care you received while you were in the hospital after you are discharged, you can call the unit and asked to speak with the hospitalist on call if the hospitalist that took care of you is not available. Once you are discharged, your primary care physician will handle any further medical issues. Please note that NO REFILLS for any discharge medications will be authorized once you are discharged, as it is imperative that you return to your primary care physician (or establish a relationship with a primary care physician if you do not have one) for your aftercare needs so that they can reassess your need for medications and monitor your lab values.

## 2015-04-12 NOTE — Discharge Summary (Signed)
[facility] [place]   PATIENT NAME: Robert Bentley    MR#:  [id number]  DATE OF BIRTH:  May 20, 1941  DATE OF ADMISSION:  04/10/2015 ADMITTING PHYSICIAN: Lytle Butte, MD  DATE OF DISCHARGE: 04/12/2015 10:30 AM  PRIMARY CARE PHYSICIAN: MASOUD,JAVED, MD    ADMISSION DIAGNOSIS:  Acute on chronic renal failure [N17.9, N18.9] Fever, unspecified fever cause [R50.9] Chest pain, unspecified chest pain type [R07.9]  DISCHARGE DIAGNOSIS:  Principal Problem:   Chest pain Active Problems:   Acute kidney injury   UTI (urinary tract infection)   Subtherapeutic international normalized ratio (INR)   SECONDARY DIAGNOSIS:   Past Medical History  Diagnosis Date  . CHF (congestive heart failure)   . Renal failure   . HIV disease   . Hepatitis C   . Diabetes mellitus without complication   . Hypertension   . Anxiety   . PTSD (post-traumatic stress disorder)     HOSPITAL COURSE:    74 year old male with past medical history significant for coronary artery disease, systolic CHF EF less than 30%, diabetes mellitus, atrial fibrillation on Coumadin, status post AICD placement, history of HIV and hepatitis C through blood transfusion in the past presents to the hospital secondary to chest pain.  #1 chest pain-atypical chest pain, likely noncardiac. Ruled out for ACS with 3 negative cardiac enzymes, no EKG changes, no events on telemetry. He was seen by his cardiologist Dr. Saralyn Pilar and cleared for discharge. He will follow up as an outpatient. He will continue on beta blocker, statin, nitrates, Plavix and aspirin.  #2 early sepsis-with fever and elevated white count. Likely due to urinary tract infection. Culture is pending. He was treated with Rocephin during the admission. At the time of discharge feeling much better. We'll discharge on Keflex. HIV well controlled, low risk for opportunistic infection.  #3 systolic CHF-chronic,  no exacerbation  #4 CK D stage IV-creatinine seems to be close to baseline. Stable  #5 HIV and hepatitis C -continue home medications. No changes made during hospitalization  # 6 atrial fibrillation-rate controlled, and tinea amiodarone, Coreg, Coumadin. INR subtherapeutic. He received 10 mg of warfarin on day of discharge. He he follow-up appointment with his primary care provider who manages his Coumadin the day after discharge.   DISCHARGE CONDITIONS:   Stable  CONSULTS OBTAINED:  Treatment Team:  Lytle Butte, MD Yolonda Kida, MD  DRUG ALLERGIES:   Allergies  Allergen Reactions  . Efavirenz Other (See Comments)    Reaction:  Unknown   . Statins Other (See Comments)    Reaction:  Unknown   . Sulfamethizole Other (See Comments)    Reaction:  Unknown   . Morphine And Related Nausea And Vomiting and Rash    DISCHARGE MEDICATIONS:   Discharge Medication List as of 04/12/2015  9:46 AM    START taking these medications   Details  Cephalexin 500 MG tablet Take 1 tablet (500 mg total) by mouth 2 (two) times daily., Starting 04/12/2015, Until Tue 04/17/15, Print      CONTINUE these medications which have NOT CHANGED   Details  ALPRAZolam (XANAX) 0.5 MG tablet Take 0.5 mg by mouth 2 (two) times daily as needed for anxiety., Until Discontinued, Historical Med    amiodarone (PACERONE) 400 MG tablet Take 200 mg by mouth daily., Until Discontinued, Historical Med    aspirin EC 81 MG tablet Take 81 mg by mouth every evening., Until Discontinued, Historical Med  atazanavir (REYATAZ) 300 MG capsule Take 300 mg by mouth daily., Until Discontinued, Historical Med    bimatoprost (LUMIGAN) 0.01 % SOLN Place 1 drop into both eyes at bedtime., Until Discontinued, Historical Med    carvedilol (COREG) 6.25 MG tablet Take 6.25 mg by mouth 2 (two) times daily., Until Discontinued, Historical Med    Cholecalciferol (VITAMIN D3) 2000 UNITS TABS Take 1 tablet by mouth daily., Until  Discontinued, Historical Med    clopidogrel (PLAVIX) 75 MG tablet Take 75 mg by mouth daily., Until Discontinued, Historical Med    furosemide (LASIX) 80 MG tablet Take 80 mg by mouth 2 (two) times daily., Until Discontinued, Historical Med    glimepiride (AMARYL) 2 MG tablet Take 2 mg by mouth daily as needed (for high blood sugar)., Until Discontinued, Historical Med    HYDROcodone-acetaminophen (NORCO) 10-325 MG per tablet Take 1 tablet by mouth every 6 (six) hours as needed for severe pain., Until Discontinued, Historical Med    isosorbide mononitrate (IMDUR) 30 MG 24 hr tablet Take 30 mg by mouth daily., Until Discontinued, Historical Med    lactulose (CHRONULAC) 10 GM/15ML solution Take 20 g by mouth 2 (two) times daily as needed for mild constipation., Until Discontinued, Historical Med    lamiVUDine (EPIVIR) 150 MG tablet Take 150 mg by mouth daily., Until Discontinued, Historical Med    Multiple Vitamins-Minerals (MULTIVITAMIN PO) Take 1 tablet by mouth daily., Until Discontinued, Historical Med    nitroGLYCERIN (NITROSTAT) 0.4 MG SL tablet Place 0.4 mg under the tongue every 5 (five) minutes as needed for chest pain., Until Discontinued, Historical Med    ondansetron (ZOFRAN) 8 MG tablet Take 8 mg by mouth every 8 (eight) hours as needed for nausea or vomiting., Until Discontinued, Historical Med    ritonavir (NORVIR) 100 MG TABS tablet Take 100 mg by mouth daily., Until Discontinued, Historical Med    sodium bicarbonate 650 MG tablet Take 1,300 mg by mouth 2 (two) times daily. , Until Discontinued, Historical Med    warfarin (COUMADIN) 7.5 MG tablet Take 7.5 mg by mouth at bedtime., Until Discontinued, Historical Med    zidovudine (RETROVIR) 300 MG tablet Take 300 mg by mouth 2 (two) times daily., Until Discontinued, Historical Med        DISCHARGE INSTRUCTIONS:  You will have a doctors appointment tomorrow with Dr. Rebecka Apley to check your coumadin level and for hospital  follow up.  Please take the new antibiotics as prescribed.   DIET:  Cardiac diet and Diabetic diet  DISCHARGE CONDITION:  Fair  ACTIVITY:  Activity as tolerated  OXYGEN:  Home Oxygen: No.   Oxygen Delivery: room air  DISCHARGE LOCATION:  home   If you experience worsening of your admission symptoms, develop shortness of breath, life threatening emergency, suicidal or homicidal thoughts you must seek medical attention immediately by calling 911 or calling your MD immediately  if symptoms less severe.  You Must read complete instructions/literature along with all the possible adverse reactions/side effects for all the Medicines you take and that have been prescribed to you. Take any new Medicines after you have completely understood and accpet all the possible adverse reactions/side effects.   Please note  You were cared for by a hospitalist during your hospital stay. If you have any questions about your discharge medications or the care you received while you were in the hospital after you are discharged, you can call the unit and asked to speak with the hospitalist on call if the  hospitalist that took care of you is not available. Once you are discharged, your primary care physician will handle any further medical issues. Please note that NO REFILLS for any discharge medications will be authorized once you are discharged, as it is imperative that you return to your primary care physician (or establish a relationship with a primary care physician if you do not have one) for your aftercare needs so that they can reassess your need for medications and monitor your lab values.  Today   CHIEF COMPLAINT:   Chief Complaint  Patient presents with  . Chest Pain  . Shortness of Breath    HISTORY OF PRESENT ILLNESS:  Aous Model is a 74 y.o. male with a known history of coronary artery disease, type 2 diabetes non-insulin-requiring, atrial fibrillation presenting with chest  pain. Describes acute onset chest pain, retrosternal in location, radiation to left chest, pressure in quality, 8/10 intensity, no worsening or relieving factors. Had associated nausea without emesis. Also describes one day duration of fever. He also attests to having signs and symptoms of BPH including incomplete voiding however denies any change in urinary frequency or dysuria. After receiving nitroglycerin he is now chest pain-free and emergency department   VITAL SIGNS:  Blood pressure 149/54, pulse 60, temperature 97.8 F (36.6 C), temperature source Oral, resp. rate 18, height 6\' 1"  (1.854 m), weight 73.71 kg (162 lb 8 oz), SpO2 100 %.  I/O:   Intake/Output Summary (Last 24 hours) at 04/12/15 1432 Last data filed at 04/12/15 0800  Gross per 24 hour  Intake    360 ml  Output   2450 ml  Net  -2090 ml    PHYSICAL EXAMINATION:  GENERAL:  74 y.o.-year-old patient lying in the bed with no acute distress. Pale EYES: Pupils equal, round, reactive to light and accommodation. No scleral icterus. Extraocular muscles intact.  HEENT: Head atraumatic, normocephalic. Oropharynx and nasopharynx clear. His membranes are moist NECK:  Supple, no jugular venous distention. No thyroid enlargement, no tenderness.  LUNGS: Normal breath sounds bilaterally, no wheezing, rales,rhonchi or crepitation. No use of accessory muscles of respiration.  CARDIOVASCULAR: S1, S2 normal. No murmurs, rubs, or gallops.  ABDOMEN: Soft, non-tender, non-distended. Bowel sounds present. No organomegaly or mass.  EXTREMITIES: No pedal edema, cyanosis, or clubbing.  NEUROLOGIC: Cranial nerves II through XII are intact. Muscle strength 5/5 in all extremities. Sensation intact. Gait not checked.  PSYCHIATRIC: The patient is alert and oriented x 3. Anxious for discharge SKIN: No obvious rash, lesion, or ulcer.   DATA REVIEW:   CBC  Recent Labs Lab 04/12/15 0400  WBC 9.5  HGB 9.4*  HCT 28.1*  PLT 104*    Chemistries    Recent Labs Lab 04/12/15 0400  NA 139  K 5.1  CL 105  CO2 26  GLUCOSE 170*  BUN 45*  CREATININE 2.27*  CALCIUM 8.2*    Cardiac Enzymes  Recent Labs Lab 04/11/15 1128  [place] <0.03    Microbiology Results  Results for orders placed or performed during the hospital encounter of 04/10/15  Blood culture (routine x 2)     Status: None (Preliminary result)   Collection Time: 04/10/15  8:35 PM  Result Value Ref Range Status   Specimen Description BLOOD  Final   Special Requests NONE  Final   Culture NO GROWTH < 12 HOURS  Final   Report Status PENDING  Incomplete  Blood culture (routine x 2)     Status: None (Preliminary result)  Collection Time: 04/10/15  8:43 PM  Result Value Ref Range Status   Specimen Description BLOOD  Final   Special Requests NONE  Final   Culture NO GROWTH < 12 HOURS  Final   Report Status PENDING  Incomplete  Urine culture     Status: None (Preliminary result)   Collection Time: 04/10/15  8:43 PM  Result Value Ref Range Status   Specimen Description URINE, CLEAN CATCH  Final   Special Requests NONE  Final   Culture   Final    >=100,000 COLONIES/mL STAPHYLOCOCCUS AUREUS SUSCEPTIBILITIES TO FOLLOW    Report Status PENDING  Incomplete    RADIOLOGY:  Dg Chest 1 View  04/10/2015   CLINICAL DATA:  Patient with chest pain and shortness of breath.  EXAM: CHEST  1 VIEW  COMPARISON:  Chest radiograph 11/20/2014  FINDINGS: Monitoring leads overlie the patient. Stable enlarged cardiac and mediastinal contours. No consolidative pulmonary opacities. No pleural effusion or pneumothorax. Stable defibrillator device and lead.  IMPRESSION: No acute cardiopulmonary process.   Electronically Signed   By: Lovey Newcomer M.D.   On: 04/10/2015 20:35    EKG:   Orders placed or performed in visit on 04/10/15  . EKG 12-Lead  . EKG 12-Lead  . EKG 12-Lead      Management plans discussed with the patient, family and they are in agreement.  CODE STATUS:  Full  TOTAL TIME TAKING CARE OF THIS PATIENT: 40 minutes.    Myrtis Ser M.D on 04/12/2015 at 2:32 PM  Between 7am to 6pm - Pager - [phone]  After 6pm go to [email or web address] - password [email or web address] [facility]  Office  [phone]  CC: Primary care physician; Cletis Athens, MD

## 2015-04-12 NOTE — Progress Notes (Signed)
Patient given discharge teaching and paperwork regarding medications, diet, follow-up appointments and activity. Patient understanding verbalized. No complaints at this time. IV and telemetry discontinued. Skin assessment as previously charted and vitals are stable. Patient being discharged to home.. No needs per Care Manager.  Toniann Ket

## 2015-04-13 DIAGNOSIS — N390 Urinary tract infection, site not specified: Secondary | ICD-10-CM | POA: Diagnosis not present

## 2015-04-13 DIAGNOSIS — F4310 Post-traumatic stress disorder, unspecified: Secondary | ICD-10-CM | POA: Diagnosis not present

## 2015-04-13 DIAGNOSIS — I4891 Unspecified atrial fibrillation: Secondary | ICD-10-CM | POA: Diagnosis not present

## 2015-04-13 DIAGNOSIS — I509 Heart failure, unspecified: Secondary | ICD-10-CM | POA: Diagnosis not present

## 2015-04-13 LAB — URINE CULTURE

## 2015-04-15 LAB — CULTURE, BLOOD (ROUTINE X 2)
CULTURE: NO GROWTH
Culture: NO GROWTH

## 2015-04-16 DIAGNOSIS — N183 Chronic kidney disease, stage 3 (moderate): Secondary | ICD-10-CM | POA: Diagnosis not present

## 2015-04-16 DIAGNOSIS — D631 Anemia in chronic kidney disease: Secondary | ICD-10-CM | POA: Diagnosis not present

## 2015-04-16 DIAGNOSIS — R809 Proteinuria, unspecified: Secondary | ICD-10-CM | POA: Diagnosis not present

## 2015-04-16 DIAGNOSIS — I255 Ischemic cardiomyopathy: Secondary | ICD-10-CM | POA: Diagnosis not present

## 2015-04-16 DIAGNOSIS — I10 Essential (primary) hypertension: Secondary | ICD-10-CM | POA: Diagnosis not present

## 2015-04-16 DIAGNOSIS — I493 Ventricular premature depolarization: Secondary | ICD-10-CM | POA: Diagnosis not present

## 2015-04-16 DIAGNOSIS — I5022 Chronic systolic (congestive) heart failure: Secondary | ICD-10-CM | POA: Diagnosis not present

## 2015-04-16 DIAGNOSIS — I519 Heart disease, unspecified: Secondary | ICD-10-CM | POA: Diagnosis not present

## 2015-04-16 DIAGNOSIS — E875 Hyperkalemia: Secondary | ICD-10-CM | POA: Diagnosis not present

## 2015-04-16 NOTE — Patient Outreach (Signed)
[facility]) Care Management  04/16/2015  DADDY DISOTELL 09-Sep-1941 [id number]   Referral from [place], assigned Maury Dus, RN to outreach.  [name]. [place], [place] Management [place] Assistant Phone: [phone] Fax: [phone]

## 2015-04-19 DIAGNOSIS — Z95810 Presence of automatic (implantable) cardiac defibrillator: Secondary | ICD-10-CM | POA: Diagnosis not present

## 2015-04-19 DIAGNOSIS — Z45010 Encounter for checking and testing of cardiac pacemaker pulse generator [battery]: Secondary | ICD-10-CM | POA: Diagnosis not present

## 2015-04-19 DIAGNOSIS — I472 Ventricular tachycardia: Secondary | ICD-10-CM | POA: Diagnosis not present

## 2015-04-26 DIAGNOSIS — I2510 Atherosclerotic heart disease of native coronary artery without angina pectoris: Secondary | ICD-10-CM | POA: Diagnosis not present

## 2015-04-26 DIAGNOSIS — I4891 Unspecified atrial fibrillation: Secondary | ICD-10-CM | POA: Diagnosis not present

## 2015-04-26 DIAGNOSIS — D509 Iron deficiency anemia, unspecified: Secondary | ICD-10-CM | POA: Diagnosis not present

## 2015-04-26 DIAGNOSIS — I739 Peripheral vascular disease, unspecified: Secondary | ICD-10-CM | POA: Diagnosis not present

## 2015-05-01 DIAGNOSIS — I6529 Occlusion and stenosis of unspecified carotid artery: Secondary | ICD-10-CM | POA: Diagnosis not present

## 2015-05-01 DIAGNOSIS — R55 Syncope and collapse: Secondary | ICD-10-CM | POA: Diagnosis not present

## 2015-05-01 DIAGNOSIS — I509 Heart failure, unspecified: Secondary | ICD-10-CM | POA: Diagnosis not present

## 2015-05-01 DIAGNOSIS — E875 Hyperkalemia: Secondary | ICD-10-CM | POA: Diagnosis not present

## 2015-05-01 DIAGNOSIS — I2510 Atherosclerotic heart disease of native coronary artery without angina pectoris: Secondary | ICD-10-CM | POA: Diagnosis not present

## 2015-05-01 DIAGNOSIS — I10 Essential (primary) hypertension: Secondary | ICD-10-CM | POA: Diagnosis not present

## 2015-05-01 DIAGNOSIS — N183 Chronic kidney disease, stage 3 (moderate): Secondary | ICD-10-CM | POA: Diagnosis not present

## 2015-05-01 DIAGNOSIS — I6521 Occlusion and stenosis of right carotid artery: Secondary | ICD-10-CM | POA: Diagnosis not present

## 2015-05-03 ENCOUNTER — Encounter: Payer: Self-pay | Admitting: Oncology

## 2015-05-03 ENCOUNTER — Inpatient Hospital Stay: Payer: Medicare Other

## 2015-05-03 ENCOUNTER — Inpatient Hospital Stay: Payer: Medicare Other | Attending: Oncology | Admitting: Oncology

## 2015-05-03 VITALS — BP 126/75 | HR 62 | Temp 97.2°F | Wt 172.6 lb

## 2015-05-03 DIAGNOSIS — N189 Chronic kidney disease, unspecified: Principal | ICD-10-CM

## 2015-05-03 DIAGNOSIS — I2510 Atherosclerotic heart disease of native coronary artery without angina pectoris: Secondary | ICD-10-CM | POA: Insufficient documentation

## 2015-05-03 DIAGNOSIS — D696 Thrombocytopenia, unspecified: Secondary | ICD-10-CM | POA: Insufficient documentation

## 2015-05-03 DIAGNOSIS — E119 Type 2 diabetes mellitus without complications: Secondary | ICD-10-CM | POA: Insufficient documentation

## 2015-05-03 DIAGNOSIS — Z86718 Personal history of other venous thrombosis and embolism: Secondary | ICD-10-CM | POA: Insufficient documentation

## 2015-05-03 DIAGNOSIS — Z79899 Other long term (current) drug therapy: Secondary | ICD-10-CM | POA: Diagnosis not present

## 2015-05-03 DIAGNOSIS — I509 Heart failure, unspecified: Secondary | ICD-10-CM | POA: Insufficient documentation

## 2015-05-03 DIAGNOSIS — F4310 Post-traumatic stress disorder, unspecified: Secondary | ICD-10-CM | POA: Diagnosis not present

## 2015-05-03 DIAGNOSIS — I129 Hypertensive chronic kidney disease with stage 1 through stage 4 chronic kidney disease, or unspecified chronic kidney disease: Secondary | ICD-10-CM | POA: Diagnosis not present

## 2015-05-03 DIAGNOSIS — Z7982 Long term (current) use of aspirin: Secondary | ICD-10-CM | POA: Diagnosis not present

## 2015-05-03 DIAGNOSIS — Z7902 Long term (current) use of antithrombotics/antiplatelets: Secondary | ICD-10-CM | POA: Insufficient documentation

## 2015-05-03 DIAGNOSIS — Z7901 Long term (current) use of anticoagulants: Secondary | ICD-10-CM | POA: Insufficient documentation

## 2015-05-03 DIAGNOSIS — I4891 Unspecified atrial fibrillation: Secondary | ICD-10-CM | POA: Diagnosis not present

## 2015-05-03 DIAGNOSIS — B182 Chronic viral hepatitis C: Secondary | ICD-10-CM | POA: Insufficient documentation

## 2015-05-03 DIAGNOSIS — B20 Human immunodeficiency virus [HIV] disease: Secondary | ICD-10-CM | POA: Insufficient documentation

## 2015-05-03 DIAGNOSIS — D631 Anemia in chronic kidney disease: Secondary | ICD-10-CM | POA: Diagnosis not present

## 2015-05-03 LAB — CBC WITH DIFFERENTIAL/PLATELET
Basophils Absolute: 0.2 10*3/uL — ABNORMAL HIGH (ref 0–0.1)
Basophils Relative: 3 %
EOS ABS: 0.1 10*3/uL (ref 0–0.7)
Eosinophils Relative: 1 %
HEMATOCRIT: 33.7 % — AB (ref 40.0–52.0)
HEMOGLOBIN: 11.1 g/dL — AB (ref 13.0–18.0)
LYMPHS PCT: 27 %
Lymphs Abs: 1.6 10*3/uL (ref 1.0–3.6)
MCH: 40.2 pg — AB (ref 26.0–34.0)
MCHC: 33 g/dL (ref 32.0–36.0)
MCV: 121.8 fL — ABNORMAL HIGH (ref 80.0–100.0)
MONO ABS: 0.4 10*3/uL (ref 0.2–1.0)
Monocytes Relative: 8 %
Neutro Abs: 3.6 10*3/uL (ref 1.4–6.5)
Neutrophils Relative %: 61 %
PLATELETS: 94 10*3/uL — AB (ref 150–440)
RBC: 2.77 MIL/uL — ABNORMAL LOW (ref 4.40–5.90)
RDW: 15.2 % — AB (ref 11.5–14.5)
WBC: 5.8 10*3/uL (ref 3.8–10.6)

## 2015-05-03 LAB — IRON AND TIBC
Iron: 86 ug/dL (ref 45–182)
Saturation Ratios: 25 % (ref 17.9–39.5)
TIBC: 342 ug/dL (ref 250–450)
UIBC: 256 ug/dL

## 2015-05-03 LAB — FERRITIN: Ferritin: 127 ng/mL (ref 24–336)

## 2015-05-07 DIAGNOSIS — I2510 Atherosclerotic heart disease of native coronary artery without angina pectoris: Secondary | ICD-10-CM | POA: Diagnosis not present

## 2015-05-07 DIAGNOSIS — I4891 Unspecified atrial fibrillation: Secondary | ICD-10-CM | POA: Diagnosis not present

## 2015-05-07 DIAGNOSIS — D509 Iron deficiency anemia, unspecified: Secondary | ICD-10-CM | POA: Diagnosis not present

## 2015-05-07 DIAGNOSIS — E8881 Metabolic syndrome: Secondary | ICD-10-CM | POA: Diagnosis not present

## 2015-05-11 ENCOUNTER — Other Ambulatory Visit: Payer: Self-pay

## 2015-05-11 NOTE — Patient Outreach (Signed)
[facility]) Care Management  05/11/2015  Robert Bentley 01/24/1941 [id number]   RN CM attempted to speak with patient about the services of [facility].  Patient was very abrupt and refused to speak with this RN CM.  Patient stated he did not want to hear anything RN CM had to say and hung up the phone.  RN CM will close this case and notify patient's primary doctor.  Maury Dus, RN, Ishmael Holter, [place] Telephonic Care Coordinator [phone]

## 2015-05-15 NOTE — Patient Outreach (Signed)
[facility]) Care Management  05/15/2015  Robert Bentley 06/11/1941 [id number]   Notification from Maury Dus, RN to close case due to patient refused [place] Management services.  [name]. [place], [place] Management [place] Assistant Phone: [phone] Fax: [phone]

## 2015-05-19 NOTE — Progress Notes (Signed)
[place]  [phone] [phone]  ID: Horace Porteous OB: [birth year]  MR#: [id number]  [id number]  Patient Care Team: Cletis Athens, MD as PCP - General (Internal Medicine)  CHIEF COMPLAINT:  Chief Complaint  Patient presents with  . Follow-up  . Anemia    INTERVAL HISTORY: Patient last evaluated in clinic in June 2015. He has been referred back for decreased platelet count as well as anemia.  He has multiple medical problems including CHF, HIV, and hepatitis C. He has thrombocytopenia which have been relatively unchanged since February 2015. He does not complain of shortness of breath or chest pain today. He has no neurologic complaints. He denies any fevers. He denies any recent weight loss. He denies any nausea, vomiting, constipation, or diarrhea. He denies any leg pain. Patient offers no further specific complaints.  REVIEW OF SYSTEMS:   Review of Systems  Constitutional: Negative for fever and weight loss.  Respiratory: Negative.   Cardiovascular: Negative.   Gastrointestinal: Negative.   Musculoskeletal: Negative.   Neurological: Negative.     As per HPI. Otherwise, a complete review of systems is negatve.  PAST MEDICAL HISTORY: Past Medical History  Diagnosis Date  . CHF (congestive heart failure)   . Renal failure   . HIV disease   . Hepatitis C   . Diabetes mellitus without complication   . Hypertension   . Anxiety   . PTSD (post-traumatic stress disorder)   . Atrial fibrillation   . Coronary artery disease     PAST SURGICAL HISTORY: Past Surgical History  Procedure Laterality Date  . Cholecystectomy    . Ep implantable device    . Appendectomy    . Cardiac stenting      FAMILY HISTORY Family History  Problem Relation Age of Onset  . Heart failure Mother        ADVANCED DIRECTIVES:    HEALTH MAINTENANCE: History  Substance Use Topics  . Smoking status: Never Smoker   . Smokeless tobacco: Not on  file  . Alcohol Use: No     Colonoscopy:  PAP:  Bone density:  Lipid panel:  Allergies  Allergen Reactions  . Morphine Nausea And Vomiting, Rash and Other (See Comments)  . Efavirenz Other (See Comments)    Reaction:  Unknown   . Statins Other (See Comments)    Reaction:  Unknown   . Sulfamethizole Other (See Comments)    fever Reaction:  Unknown   . Morphine And Related Nausea And Vomiting and Rash    Current Outpatient Prescriptions  Medication Sig Dispense Refill  . ALPRAZolam (XANAX) 0.5 MG tablet Take 0.5 mg by mouth 2 (two) times daily as needed for anxiety.    Marland Kitchen amiodarone (PACERONE) 400 MG tablet Take 200 mg by mouth daily.    . bisacodyl (DULCOLAX) 5 MG EC tablet Take 15 mg by mouth daily as needed for moderate constipation.    . carvedilol (COREG) 6.25 MG tablet Take 6.25 mg by mouth 2 (two) times daily.    . Cholecalciferol (VITAMIN D3) 2000 UNITS TABS Take 1 tablet by mouth daily.    . clopidogrel (PLAVIX) 75 MG tablet Take 75 mg by mouth daily.    Marland Kitchen docusate sodium (COLACE) 50 MG capsule Take 50 mg by mouth 2 (two) times daily.    . dolutegravir (TIVICAY) 50 MG tablet Take 50 mg by mouth daily.    . furosemide (LASIX) 80 MG tablet Take 80 mg by mouth 2 (two) times  daily.    . glimepiride (AMARYL) 2 MG tablet Take 2 mg by mouth daily as needed (for high blood sugar).    Marland Kitchen HYDROcodone-acetaminophen (NORCO) 10-325 MG per tablet Take 1 tablet by mouth every 6 (six) hours as needed for severe pain.    . isosorbide mononitrate (IMDUR) 30 MG 24 hr tablet Take 30 mg by mouth daily.    Marland Kitchen lactulose (CHRONULAC) 10 GM/15ML solution Take 20 g by mouth 2 (two) times daily as needed for mild constipation.    Marland Kitchen lamiVUDine (EPIVIR) 150 MG tablet Take 150 mg by mouth daily.    Marland Kitchen latanoprost (XALATAN) 0.005 % ophthalmic solution INSTILL ONE DROP TO BOTH EYES EVERY NIGHT  6  . Multiple Vitamins-Iron (MULTIVITAMINS WITH IRON) TABS tablet Take 1 tablet by mouth daily.    .  nitroGLYCERIN (NITROSTAT) 0.4 MG SL tablet Place 0.4 mg under the tongue every 5 (five) minutes as needed for chest pain.    Marland Kitchen ondansetron (ZOFRAN) 8 MG tablet Take 8 mg by mouth every 8 (eight) hours as needed for nausea or vomiting.    . sodium bicarbonate 650 MG tablet Take 1,300 mg by mouth 2 (two) times daily.     . sodium polystyrene (KAYEXALATE) 15 GM/60ML suspension Take 15 g by mouth 3 (three) times a week.    . warfarin (COUMADIN) 7.5 MG tablet Take 7.5 mg by mouth at bedtime.    . zidovudine (RETROVIR) 300 MG tablet Take 300 mg by mouth 2 (two) times daily.     No current facility-administered medications for this visit.    OBJECTIVE: Filed Vitals:   05/03/15 1209  BP: 126/75  Pulse: 62  Temp: 97.2 F (36.2 C)     Body mass index is 22.78 kg/(m^2).    ECOG FS:0 - Asymptomatic  General: Well-developed, well-nourished, no acute distress. Eyes: Pink conjunctiva, anicteric sclera. HEENT: Normocephalic, moist mucous membranes, clear oropharnyx. Lungs: Clear to auscultation bilaterally. Heart: Regular rate and rhythm. No rubs, murmurs, or gallops. Abdomen: Soft, nontender, nondistended. No organomegaly noted, normoactive bowel sounds. Musculoskeletal: No edema, cyanosis, or clubbing. Neuro: Alert, answering all questions appropriately. Cranial nerves grossly intact. Skin: No rashes or petechiae noted. Psych: Normal affect. Lymphatics: No cervical, calvicular, axillary or inguinal LAD.   LAB RESULTS:  Lab Results  Component Value Date   NA 139 04/12/2015   K 5.1 04/12/2015   CL 105 04/12/2015   CO2 26 04/12/2015   GLUCOSE 170* 04/12/2015   BUN 45* 04/12/2015   CREATININE 2.27* 04/12/2015   CALCIUM 8.2* 04/12/2015   PROT 6.6 11/20/2014   ALBUMIN 3.0* 11/20/2014   AST 51* 11/20/2014   ALT 65* 11/20/2014   ALKPHOS 91 11/20/2014   BILITOT 1.5* 11/20/2014   GFRNONAA 27* 04/12/2015   GFRAA 32* 04/12/2015    Lab Results  Component Value Date   WBC 5.8 05/03/2015     NEUTROABS 3.6 05/03/2015   HGB 11.1* 05/03/2015   HCT 33.7* 05/03/2015   MCV 121.8* 05/03/2015   PLT 94* 05/03/2015     STUDIES: No results found.  ASSESSMENT:   PLAN:    1. h/o DVT: Despite patient's thrombocytopenia as well as on Plavix and aspirin,continue Coumadin.  Patient requires an INR of 2.0-3.0. INRs are monitored by his primary care physician 2. Thrombocytopenia: Decreased, but stable.  Likely multifactorial, including medications. Monitor. No intervention is needed at this time. 3. Anemia: Secondary to chronic renal insufficiency. Iron stores are within normal limits. Patient's hemoglobin is greater than  10, therefore he does not require Procrit at this time. Starting in August, patient will have laboratory work and consideration of Procrit every 4 weeks. He will then follow-up in approximately 6 months for further evaluation.   Approximately 30 minutes was spent in discussion and consultation.  Patient expressed understanding and was in agreement with this plan. He also understands that He can call clinic at any time with any questions, concerns, or complaints.    Lloyd Huger, MD   05/19/2015 2:42 PM

## 2015-05-21 DIAGNOSIS — E11359 Type 2 diabetes mellitus with proliferative diabetic retinopathy without macular edema: Secondary | ICD-10-CM | POA: Diagnosis not present

## 2015-05-28 DIAGNOSIS — I4891 Unspecified atrial fibrillation: Secondary | ICD-10-CM | POA: Diagnosis not present

## 2015-05-28 DIAGNOSIS — I119 Hypertensive heart disease without heart failure: Secondary | ICD-10-CM | POA: Diagnosis not present

## 2015-05-28 DIAGNOSIS — F4310 Post-traumatic stress disorder, unspecified: Secondary | ICD-10-CM | POA: Diagnosis not present

## 2015-05-28 DIAGNOSIS — M792 Neuralgia and neuritis, unspecified: Secondary | ICD-10-CM | POA: Diagnosis not present

## 2015-05-30 ENCOUNTER — Other Ambulatory Visit: Payer: Self-pay | Admitting: *Deleted

## 2015-05-30 DIAGNOSIS — N189 Chronic kidney disease, unspecified: Principal | ICD-10-CM

## 2015-05-30 DIAGNOSIS — D631 Anemia in chronic kidney disease: Secondary | ICD-10-CM

## 2015-05-31 ENCOUNTER — Inpatient Hospital Stay: Payer: Medicare Other | Attending: Oncology

## 2015-05-31 ENCOUNTER — Inpatient Hospital Stay: Payer: Medicare Other

## 2015-06-04 DIAGNOSIS — I509 Heart failure, unspecified: Secondary | ICD-10-CM | POA: Diagnosis not present

## 2015-06-04 DIAGNOSIS — M792 Neuralgia and neuritis, unspecified: Secondary | ICD-10-CM | POA: Diagnosis not present

## 2015-06-04 DIAGNOSIS — B20 Human immunodeficiency virus [HIV] disease: Secondary | ICD-10-CM | POA: Diagnosis not present

## 2015-06-04 DIAGNOSIS — I4891 Unspecified atrial fibrillation: Secondary | ICD-10-CM | POA: Diagnosis not present

## 2015-06-06 ENCOUNTER — Other Ambulatory Visit: Payer: Self-pay | Admitting: Internal Medicine

## 2015-06-06 DIAGNOSIS — C220 Liver cell carcinoma: Secondary | ICD-10-CM

## 2015-06-06 NOTE — Progress Notes (Signed)
Robert Bentley, Robert Bentley ([id number]) Visit Report for 02/15/2015 Arrival Information Details Patient Name: Robert Bentley Date of Service: 02/15/2015 8:15 AM Medical Record Number: [id number] Patient Account Number: [id number] Date of Birth/Sex: 02/05/1941 (74 y.o. Male) Treating RN: Baruch Gouty, RN, BSN, Velva Harman Primary Care Physician: PATIENT, NO Other Clinician: Referring Physician: referred, self Treating Physician/Extender: Frann Rider in Treatment: 5 Visit Information History Since Last Visit Any new allergies or adverse reactions: No Patient Arrived: Ambulatory Had a fall or experienced change in No Arrival Time: 08:30 activities of daily living that may affect Accompanied By: self risk of falls: Transfer Assistance: None Signs or symptoms of abuse/neglect since last No Patient Identification Yes visito Verified: Hospitalized since last visit: No Secondary Verification Yes Has Dressing in Place as Prescribed: Yes Process Completed: Has Compression in Place as Prescribed: Yes Patient Requires No Pain Present Now: No Transmission-Based Precautions: Patient Has Alerts: Yes Patient Alerts: Patient on Blood Thinner Type II Diabetic; HIV+, 01/05/15 R-Noncompressible Electronic Signature(s) Signed: 02/15/2015 5:51:30 PM By: Regan Lemming BSN, RN Entered By: Regan Lemming on 02/15/2015 08:30:30 Robert Bentley ([id number]) -------------------------------------------------------------------------------- Encounter Discharge Information Details Patient Name: Robert Bentley Date of Service: 02/15/2015 8:15 AM Medical Record Number: [id number] Patient Account Number: [id number] Date of Birth/Sex: 09/02/1941 (74 y.o. Male) Treating RN: Baruch Gouty, RN, BSN, [place] Primary Care Physician: PATIENT, NO Other Clinician: Referring Physician: referred, self Treating Physician/Extender: Frann Rider in Treatment: 5 Encounter Discharge Information Items Discharge Pain Level: 0 Discharge  Condition: Stable Ambulatory Status: Ambulatory Discharge Destination: Home Transportation: Private Auto Schedule Follow-up Appointment: No Medication Reconciliation completed and provided to Patient/Care No Robert Bentley: Provided on Clinical Summary of Care: 02/15/2015 Form Type Recipient Paper Patient AJ Electronic Signature(s) Signed: 02/15/2015 5:51:30 PM By: Regan Lemming BSN, RN Previous Signature: 02/15/2015 9:01:29 AM Version By: Ruthine Dose Entered By: Regan Lemming on 02/15/2015 09:02:59 Robert Bentley ([id number]) -------------------------------------------------------------------------------- Lower Extremity Assessment Details Patient Name: Robert Bentley Date of Service: 02/15/2015 8:15 AM Medical Record Number: [id number] Patient Account Number: [id number] Date of Birth/Sex: 12-Dec-1940 (74 y.o. Male) Treating RN: Baruch Gouty, RN, BSN, Administrator, sports Primary Care Physician: PATIENT, NO Other Clinician: Referring Physician: referred, self Treating Physician/Extender: Frann Rider in Treatment: 5 Edema Assessment Assessed: [Left: No] [Right: No] E[Left: dema] [Right: :] Calf Left: Right: Point of Measurement: 36 cm From Medial Instep cm 35 cm Ankle Left: Right: Point of Measurement: 11 cm From Medial Instep cm 25.7 cm Vascular Assessment Claudication: Claudication Assessment [Right:None] Pulses: Posterior Tibial Dorsalis Pedis Palpable: [Right:Yes] Extremity colors, hair growth, and conditions: Extremity Color: [Right:Mottled] Hair Growth on Extremity: [Right:Yes] Temperature of Extremity: [Right:Warm] Capillary Refill: [Right:< 3 seconds] Dependent Rubor: [Right:No] Blanched when Elevated: [Right:No] Lipodermatosclerosis: [Right:No] Toe Nail Assessment Left: Right: Thick: Yes Discolored: Yes Deformed: No Improper Length and Hygiene: No BON, REASON ([id number]) Electronic Signature(s) Signed: 02/15/2015 5:51:30 PM By: Regan Lemming BSN, RN Entered By: Regan Lemming on 02/15/2015 08:33:53 Robert Bentley ([id number]) -------------------------------------------------------------------------------- Multi Wound Chart Details Patient Name: Robert Bentley Date of Service: 02/15/2015 8:15 AM Medical Record Number: [id number] Patient Account Number: [id number] Date of Birth/Sex: [birth year] (74 y.o. Male) Treating RN: Montey Hora Primary Care Physician: PATIENT, NO Other Clinician: Referring Physician: referred, self Treating Physician/Extender: Frann Rider in Treatment: 5 Vital Signs Height(in): 73 Pulse(bpm): 62 Weight(lbs): 172 Blood Pressure 144/78 (mmHg): Body Mass Index(BMI): 23 Temperature(F): 97.6 Respiratory Rate 16 (breaths/min): Photos: [3:No Photos] [4:No Photos] [N/A:N/A] Wound Location: [3:Right Malleolus - Medial] [4:Right Malleolus - Medial, Distal] [N/A:N/A]  Wounding Event: [3:Gradually Appeared] [4:Gradually Appeared] [N/A:N/A] Primary Etiology: [3:Diabetic Wound/Ulcer of the Lower Extremity] [4:Trauma, Other] [N/A:N/A] Comorbid History: [3:Human Immunodeficiency Human Immunodeficiency N/A Virus, Myocardial Infarction, Hepatitis C, Infarction, Hepatitis C, Type II Diabetes] [4:Virus, Myocardial Type II Diabetes] Date Acquired: [3:12/25/2014] [4:01/31/2015] [N/A:N/A] Weeks of Treatment: [3:5] [4:1] [N/A:N/A] Wound Status: [3:Open] [4:Open] [N/A:N/A] Measurements L x W x D 2x1.4x0.6 [4:0.6x0.5x0.1] [N/A:N/A] (cm) Area (cm) : [3:2.199] [4:0.236] [N/A:N/A] Volume (cm) : [3:1.319] [4:0.024] [N/A:N/A] % Reduction in Area: [3:-299.80%] [4:0.00%] [N/A:N/A] % Reduction in Volume: -2298.20% [4:0.00%] [N/A:N/A] Classification: [3:Grade 1] [4:Partial Thickness] [N/A:N/A] HBO Classification: [3:N/A] [4:Grade 1] [N/A:N/A] Exudate Amount: [3:Small] [4:Small] [N/A:N/A] Exudate Type: [3:Serous] [4:Serous] [N/A:N/A] Exudate Color: [3:amber] [4:amber] [N/A:N/A] Wound Margin: [3:Flat and Intact] [4:Flat and Intact]  [N/A:N/A] Granulation Amount: [3:Medium (34-66%)] [4:Medium (34-66%)] [N/A:N/A] Granulation Quality: [3:Red] [4:Pink] [N/A:N/A] Necrotic Amount: [3:Medium (34-66%)] [4:Small (1-33%)] [N/A:N/A] Exposed Structures: [3:Fascia: No Fat: No] [4:Fascia: No Fat: No] [N/A:N/A] Tendon: No Tendon: No Muscle: No Muscle: No Joint: No Joint: No Bone: No Bone: No Limited to Skin Limited to Skin Breakdown Breakdown Epithelialization: None None N/A Periwound Skin Texture: Edema: Yes Edema: Yes N/A Excoriation: No Excoriation: No Induration: No Induration: No Callus: No Callus: No Crepitus: No Crepitus: No Fluctuance: No Fluctuance: No Friable: No Friable: No Rash: No Rash: No Scarring: No Scarring: No Periwound Skin Moist: Yes Moist: Yes N/A Moisture: Maceration: No Maceration: No Dry/Scaly: No Dry/Scaly: No Periwound Skin Color: Atrophie Blanche: No Mottled: Yes N/A Cyanosis: No Atrophie Blanche: No Ecchymosis: No Cyanosis: No Erythema: No Ecchymosis: No Hemosiderin Staining: No Erythema: No Mottled: No Hemosiderin Staining: No Pallor: No Pallor: No Rubor: No Rubor: No Temperature: No Abnormality No Abnormality N/A Tenderness on Yes Yes N/A Palpation: Wound Preparation: Ulcer Cleansing: Ulcer Cleansing: N/A Rinsed/Irrigated with Rinsed/Irrigated with Saline Saline Topical Anesthetic Topical Anesthetic Applied: Other: lidocaine Applied: Other: lidocaine 4% 4% Treatment Notes Electronic Signature(s) Signed: 02/15/2015 6:00:23 PM By: Montey Hora Entered By: Montey Hora on 02/15/2015 08:47:29 Robert Bentley ([id number]) -------------------------------------------------------------------------------- Multi-Disciplinary Care Plan Details Patient Name: Robert Bentley Date of Service: 02/15/2015 8:15 AM Medical Record Number: [id number] Patient Account Number: [id number] Date of Birth/Sex: [birth year] (74 y.o. Male) Treating RN: Montey Hora Primary  Care Physician: PATIENT, NO Other Clinician: Referring Physician: referred, self Treating Physician/Extender: Frann Rider in Treatment: 5 Active Inactive Electronic Signature(s) Signed: 04/04/2015 4:33:23 PM By: Montey Hora Signed: 05/31/2015 8:29:26 AM By: Gretta Cool RN, BSN, Kim RN, BSN Previous Signature: 02/15/2015 6:00:23 PM Version By: Montey Hora Entered By: Gretta Cool RN, BSN, Kim on 03/20/2015 13:34:12 Robert Bentley ([id number]) -------------------------------------------------------------------------------- Pain Assessment Details Patient Name: Robert Bentley Date of Service: 02/15/2015 8:15 AM Medical Record Number: [id number] Patient Account Number: [id number] Date of Birth/Sex: 1940/12/06 (74 y.o. Male) Treating RN: Baruch Gouty, RN, BSN, [place] Primary Care Physician: PATIENT, NO Other Clinician: Referring Physician: referred, self Treating Physician/Extender: Frann Rider in Treatment: 5 Active Problems Location of Pain Severity and Description of Pain Patient Has Paino No Site Locations Pain Management and Medication Current Pain Management: Electronic Signature(s) Signed: 02/15/2015 5:51:30 PM By: Regan Lemming BSN, RN Entered By: Regan Lemming on 02/15/2015 08:30:36 Robert Bentley ([id number]) -------------------------------------------------------------------------------- Patient/Caregiver Education Details Patient Name: Robert Bentley Date of Service: 02/15/2015 8:15 AM Medical Record Number: [id number] Patient Account Number: [id number] Date of Birth/Gender: 06-16-1941 (74 y.o. Male) Treating RN: Baruch Gouty, RN, BSN, Velva Harman Primary Care Physician: PATIENT, NO Other Clinician: Referring Physician: referred, self Treating Physician/Extender: Frann Rider in Treatment: 5 Education Assessment Education  Provided To: Patient Education Topics Provided Basic Hygiene: Methods: Explain/Verbal Wound/Skin Impairment: Methods: Explain/Verbal Responses: State  content correctly Electronic Signature(s) Signed: 02/15/2015 5:51:30 PM By: Regan Lemming BSN, RN Entered By: Regan Lemming on 02/15/2015 09:03:11 Robert Bentley ([id number]) -------------------------------------------------------------------------------- Wound Assessment Details Patient Name: Robert Bentley Date of Service: 02/15/2015 8:15 AM Medical Record Number: [id number] Patient Account Number: [id number] Date of Birth/Sex: 1941-01-22 (73 y.o. Male) Treating RN: Baruch Gouty, RN, BSN, Administrator, sports Primary Care Physician: PATIENT, NO Other Clinician: Referring Physician: referred, self Treating Physician/Extender: Frann Rider in Treatment: 5 Wound Status Wound Number: 3 Primary Diabetic Wound/Ulcer of the Lower Etiology: Extremity Wound Location: Right Malleolus - Medial Wound Open Wounding Event: Gradually Appeared Status: Date Acquired: 12/25/2014 Comorbid Human Immunodeficiency Virus, Weeks Of Treatment: 5 History: Myocardial Infarction, Hepatitis C, Type Clustered Wound: No II Diabetes Photos Photo Uploaded By: Regan Lemming on 02/15/2015 17:35:57 Wound Measurements Length: (cm) 2 Width: (cm) 1.4 Depth: (cm) 0.6 Area: (cm) 2.199 Volume: (cm) 1.319 % Reduction in Area: -299.8% % Reduction in Volume: -2298.2% Epithelialization: None Tunneling: No Undermining: No Wound Description Classification: Grade 1 Foul Odor Aft Wound Margin: Flat and Intact Exudate Amount: Small Exudate Type: Serous Exudate Color: amber er Cleansing: No Wound Bed Granulation Amount: Medium (34-66%) Exposed Structure Granulation Quality: Red Fascia Exposed: No Necrotic Amount: Medium (34-66%) Fat Layer Exposed: No Necrotic Quality: Adherent Slough Tendon Exposed: No Neyens, Rooney A. ([id number]) Muscle Exposed: No Joint Exposed: No Bone Exposed: No Limited to Skin Breakdown Periwound Skin Texture Texture Color No Abnormalities Noted: No No Abnormalities Noted: No Callus: No Atrophie  Blanche: No Crepitus: No Cyanosis: No Excoriation: No Ecchymosis: No Fluctuance: No Erythema: No Friable: No Hemosiderin Staining: No Induration: No Mottled: No Localized Edema: Yes Pallor: No Rash: No Rubor: No Scarring: No Temperature / Pain Moisture Temperature: No Abnormality No Abnormalities Noted: No Tenderness on Palpation: Yes Dry / Scaly: No Maceration: No Moist: Yes Wound Preparation Ulcer Cleansing: Rinsed/Irrigated with Saline Topical Anesthetic Applied: Other: lidocaine 4%, Electronic Signature(s) Signed: 02/15/2015 5:51:30 PM By: Regan Lemming BSN, RN Entered By: Regan Lemming on 02/15/2015 08:38:21 Robert Bentley ([id number]) -------------------------------------------------------------------------------- Wound Assessment Details Patient Name: Robert Bentley Date of Service: 02/15/2015 8:15 AM Medical Record Number: [id number] Patient Account Number: [id number] Date of Birth/Sex: 1940/10/26 (74 y.o. Male) Treating RN: Afful, RN, BSN, Administrator, sports Primary Care Physician: PATIENT, NO Other Clinician: Referring Physician: referred, self Treating Physician/Extender: Frann Rider in Treatment: 5 Wound Status Wound Number: 4 Primary Trauma, Other Etiology: Wound Location: Right Malleolus - Medial, Distal Wound Open Wounding Event: Gradually Appeared Status: Date Acquired: 01/31/2015 Comorbid Human Immunodeficiency Virus, Weeks Of Treatment: 1 History: Myocardial Infarction, Hepatitis C, Type Clustered Wound: No II Diabetes Photos Photo Uploaded By: Regan Lemming on 02/15/2015 17:37:13 Wound Measurements Length: (cm) 0.6 Width: (cm) 0.5 Depth: (cm) 0.1 Area: (cm) 0.236 Volume: (cm) 0.024 % Reduction in Area: 0% % Reduction in Volume: 0% Epithelialization: None Tunneling: No Undermining: No Wound Description Classification: Partial Thickness Diabetic Severity (Wagner): Grade 1 Wound Margin: Flat and Intact Exudate Amount: Small Exudate Type:  Serous Exudate Color: amber Foul Odor After Cleansing: No Wound Bed Granulation Amount: Medium (34-66%) Exposed Structure Granulation Quality: Pink Fascia Exposed: No Necrotic Amount: Small (1-33%) Fat Layer Exposed: No Tripp, Ovid A. ([id number]) Necrotic Quality: Adherent Slough Tendon Exposed: No Muscle Exposed: No Joint Exposed: No Bone Exposed: No Limited to Skin Breakdown Periwound Skin Texture Texture Color No Abnormalities Noted: No No Abnormalities Noted: No Callus: No Atrophie  Blanche: No Crepitus: No Cyanosis: No Excoriation: No Ecchymosis: No Fluctuance: No Erythema: No Friable: No Hemosiderin Staining: No Induration: No Mottled: Yes Localized Edema: Yes Pallor: No Rash: No Rubor: No Scarring: No Temperature / Pain Moisture Temperature: No Abnormality No Abnormalities Noted: No Tenderness on Palpation: Yes Dry / Scaly: No Maceration: No Moist: Yes Wound Preparation Ulcer Cleansing: Rinsed/Irrigated with Saline Topical Anesthetic Applied: Other: lidocaine 4%, Electronic Signature(s) Signed: 02/15/2015 5:51:30 PM By: Regan Lemming BSN, RN Entered By: Regan Lemming on 02/15/2015 08:38:47 Robert Bentley ([id number]) -------------------------------------------------------------------------------- Vitals Details Patient Name: Robert Bentley Date of Service: 02/15/2015 8:15 AM Medical Record Number: [id number] Patient Account Number: [id number] Date of Birth/Sex: [birth year] (74 y.o. Male) Treating RN: Afful, RN, BSN, [place] Primary Care Physician: PATIENT, NO Other Clinician: Referring Physician: referred, self Treating Physician/Extender: Frann Rider in Treatment: 5 Vital Signs Time Taken: 08:30 Temperature (F): 97.6 Height (in): 73 Pulse (bpm): 62 Weight (lbs): 172 Respiratory Rate (breaths/min): 16 Body Mass Index (BMI): 22.7 Blood Pressure (mmHg): 144/78 Reference Range: 80 - 120 mg / dl Electronic Signature(s) Signed: 02/15/2015  5:51:30 PM By: Regan Lemming BSN, RN Entered By: Regan Lemming on 02/15/2015 08:32:46

## 2015-06-11 ENCOUNTER — Inpatient Hospital Stay: Payer: Federal, State, Local not specified - PPO

## 2015-06-11 ENCOUNTER — Ambulatory Visit: Admission: RE | Admit: 2015-06-11 | Payer: Medicare Other | Source: Ambulatory Visit

## 2015-06-11 ENCOUNTER — Other Ambulatory Visit: Payer: Federal, State, Local not specified - PPO

## 2015-06-12 ENCOUNTER — Inpatient Hospital Stay: Payer: Medicare Other

## 2015-06-28 ENCOUNTER — Inpatient Hospital Stay: Payer: Medicare Other

## 2015-07-05 DIAGNOSIS — B20 Human immunodeficiency virus [HIV] disease: Secondary | ICD-10-CM | POA: Diagnosis not present

## 2015-07-05 DIAGNOSIS — I509 Heart failure, unspecified: Secondary | ICD-10-CM | POA: Diagnosis not present

## 2015-07-05 DIAGNOSIS — M792 Neuralgia and neuritis, unspecified: Secondary | ICD-10-CM | POA: Diagnosis not present

## 2015-07-05 DIAGNOSIS — I4891 Unspecified atrial fibrillation: Secondary | ICD-10-CM | POA: Diagnosis not present

## 2015-07-23 DIAGNOSIS — E875 Hyperkalemia: Secondary | ICD-10-CM | POA: Diagnosis not present

## 2015-07-23 DIAGNOSIS — N183 Chronic kidney disease, stage 3 (moderate): Secondary | ICD-10-CM | POA: Diagnosis not present

## 2015-07-23 DIAGNOSIS — D631 Anemia in chronic kidney disease: Secondary | ICD-10-CM | POA: Diagnosis not present

## 2015-07-23 DIAGNOSIS — I10 Essential (primary) hypertension: Secondary | ICD-10-CM | POA: Diagnosis not present

## 2015-07-23 DIAGNOSIS — N2581 Secondary hyperparathyroidism of renal origin: Secondary | ICD-10-CM | POA: Diagnosis not present

## 2015-07-23 DIAGNOSIS — R809 Proteinuria, unspecified: Secondary | ICD-10-CM | POA: Diagnosis not present

## 2015-07-24 DIAGNOSIS — I519 Heart disease, unspecified: Secondary | ICD-10-CM | POA: Diagnosis not present

## 2015-07-24 DIAGNOSIS — I5022 Chronic systolic (congestive) heart failure: Secondary | ICD-10-CM | POA: Diagnosis not present

## 2015-07-24 DIAGNOSIS — Z955 Presence of coronary angioplasty implant and graft: Secondary | ICD-10-CM | POA: Diagnosis not present

## 2015-07-24 DIAGNOSIS — I429 Cardiomyopathy, unspecified: Secondary | ICD-10-CM | POA: Diagnosis not present

## 2015-07-24 DIAGNOSIS — R079 Chest pain, unspecified: Secondary | ICD-10-CM | POA: Diagnosis not present

## 2015-07-24 DIAGNOSIS — I2510 Atherosclerotic heart disease of native coronary artery without angina pectoris: Secondary | ICD-10-CM | POA: Diagnosis not present

## 2015-07-24 DIAGNOSIS — I493 Ventricular premature depolarization: Secondary | ICD-10-CM | POA: Diagnosis not present

## 2015-07-24 DIAGNOSIS — I255 Ischemic cardiomyopathy: Secondary | ICD-10-CM | POA: Diagnosis not present

## 2015-07-24 DIAGNOSIS — I480 Paroxysmal atrial fibrillation: Secondary | ICD-10-CM | POA: Diagnosis not present

## 2015-07-24 DIAGNOSIS — E7800 Pure hypercholesterolemia, unspecified: Secondary | ICD-10-CM | POA: Diagnosis not present

## 2015-07-24 DIAGNOSIS — R0602 Shortness of breath: Secondary | ICD-10-CM | POA: Diagnosis not present

## 2015-07-24 DIAGNOSIS — I10 Essential (primary) hypertension: Secondary | ICD-10-CM | POA: Diagnosis not present

## 2015-07-26 ENCOUNTER — Inpatient Hospital Stay: Payer: Medicare Other | Attending: Oncology

## 2015-07-26 ENCOUNTER — Inpatient Hospital Stay: Payer: Medicare Other

## 2015-07-27 ENCOUNTER — Ambulatory Visit
Admission: RE | Admit: 2015-07-27 | Discharge: 2015-07-27 | Disposition: A | Discharge status: Home / Self Care | Payer: Medicare Other | Source: Ambulatory Visit | Attending: Internal Medicine | Admitting: Internal Medicine

## 2015-07-27 DIAGNOSIS — K7689 Other specified diseases of liver: Secondary | ICD-10-CM | POA: Insufficient documentation

## 2015-07-27 DIAGNOSIS — C220 Liver cell carcinoma: Secondary | ICD-10-CM

## 2015-07-27 DIAGNOSIS — B1920 Unspecified viral hepatitis C without hepatic coma: Secondary | ICD-10-CM | POA: Diagnosis present

## 2015-07-27 DIAGNOSIS — R188 Other ascites: Secondary | ICD-10-CM | POA: Insufficient documentation

## 2015-08-01 DIAGNOSIS — I509 Heart failure, unspecified: Secondary | ICD-10-CM | POA: Diagnosis not present

## 2015-08-01 DIAGNOSIS — I4891 Unspecified atrial fibrillation: Secondary | ICD-10-CM | POA: Diagnosis not present

## 2015-08-01 DIAGNOSIS — M79604 Pain in right leg: Secondary | ICD-10-CM | POA: Diagnosis not present

## 2015-08-01 DIAGNOSIS — I119 Hypertensive heart disease without heart failure: Secondary | ICD-10-CM | POA: Diagnosis not present

## 2015-08-14 DIAGNOSIS — H40003 Preglaucoma, unspecified, bilateral: Secondary | ICD-10-CM | POA: Diagnosis not present

## 2015-08-23 ENCOUNTER — Inpatient Hospital Stay: Payer: Medicare Other | Attending: Oncology

## 2015-08-23 ENCOUNTER — Inpatient Hospital Stay: Payer: Medicare Other

## 2015-08-29 DIAGNOSIS — I4891 Unspecified atrial fibrillation: Secondary | ICD-10-CM | POA: Diagnosis not present

## 2015-08-29 DIAGNOSIS — I509 Heart failure, unspecified: Secondary | ICD-10-CM | POA: Diagnosis not present

## 2015-08-29 DIAGNOSIS — I739 Peripheral vascular disease, unspecified: Secondary | ICD-10-CM | POA: Diagnosis not present

## 2015-08-29 DIAGNOSIS — N183 Chronic kidney disease, stage 3 (moderate): Secondary | ICD-10-CM | POA: Diagnosis not present

## 2015-08-30 IMAGING — US US EXTREM LOW VENOUS BILAT
1 series · 13 of 24 positions shown · non-contrast
Comparison: None

CLINICAL DATA: Edema, shortness of Breath

EXAM:
BILATERAL LOWER EXTREMITY VENOUS DOPPLER ULTRASOUND
TECHNIQUE: Gray-scale sonography with compression, as well as color and duplex
ultrasound, were performed to evaluate the deep venous system from
the level of the common femoral vein through the popliteal and
proximal calf veins.

[Series 1: us extrem low venous bilat · 0.08mm/px · 13 of 58 slices shown]
[im 1/58]
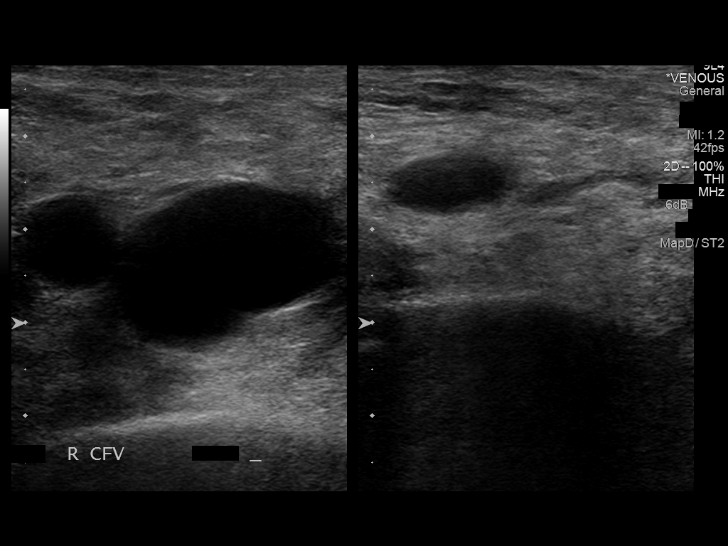
[im 5/58]
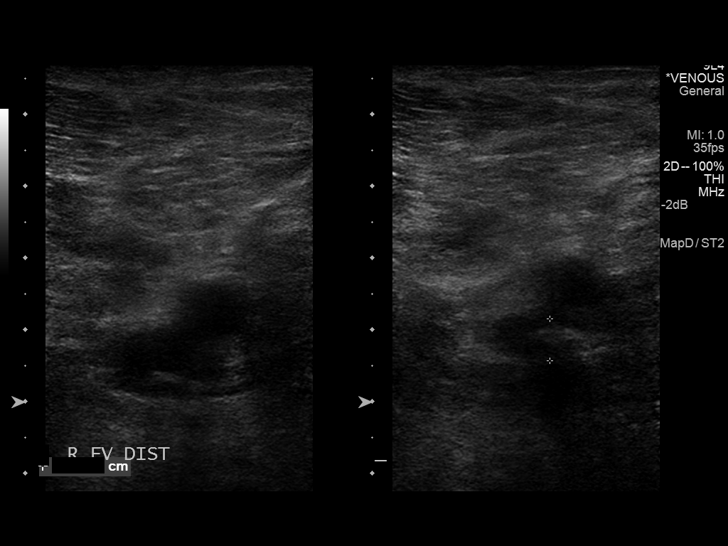
[im 10/58]
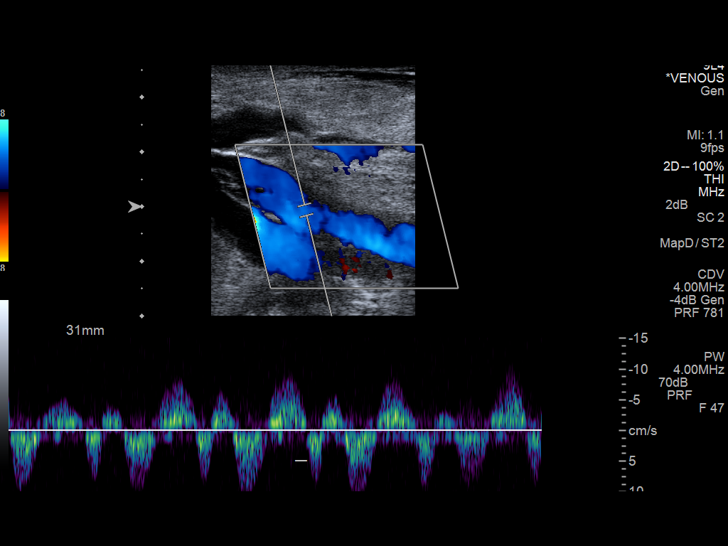
[im 15/58]
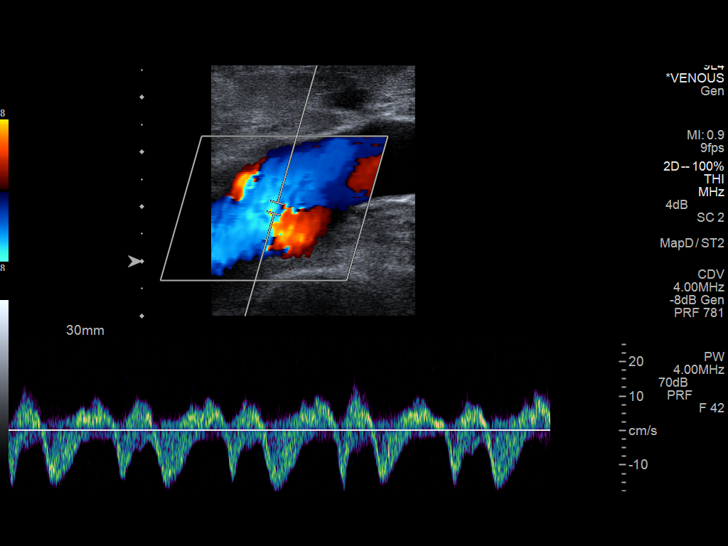
[im 20/58]
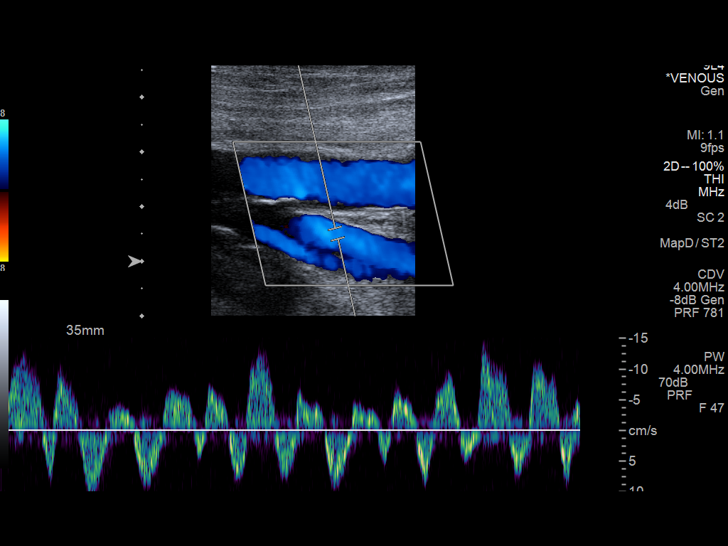
[im 25/58]
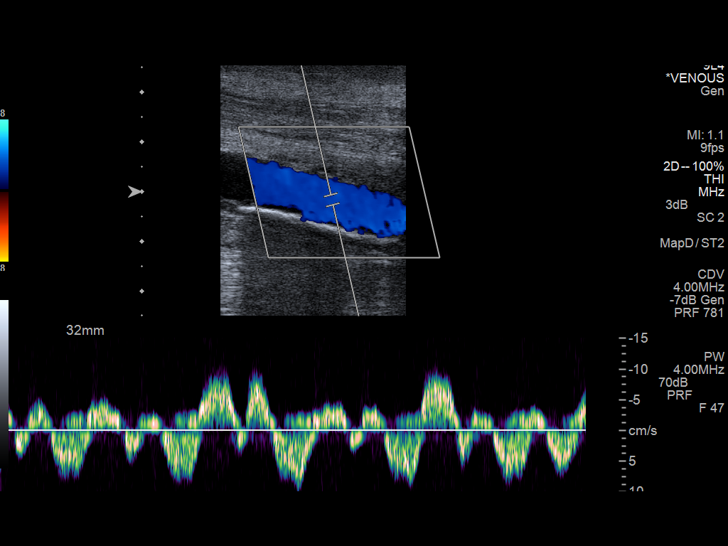
[im 30/58]
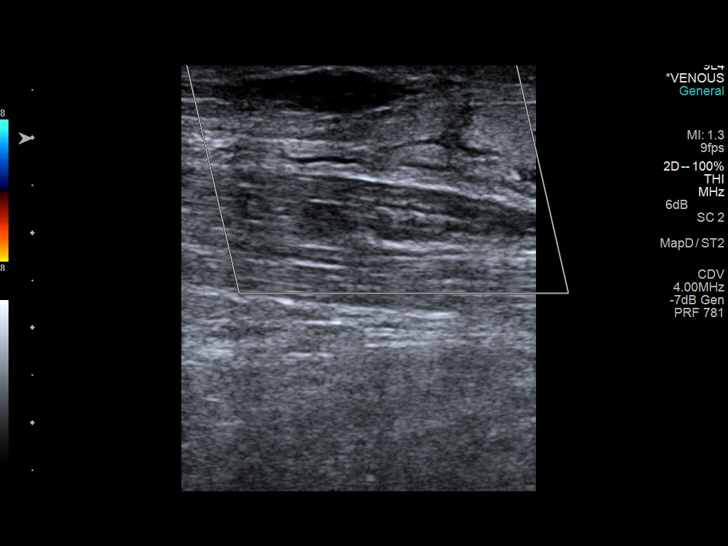
[im 33/58]
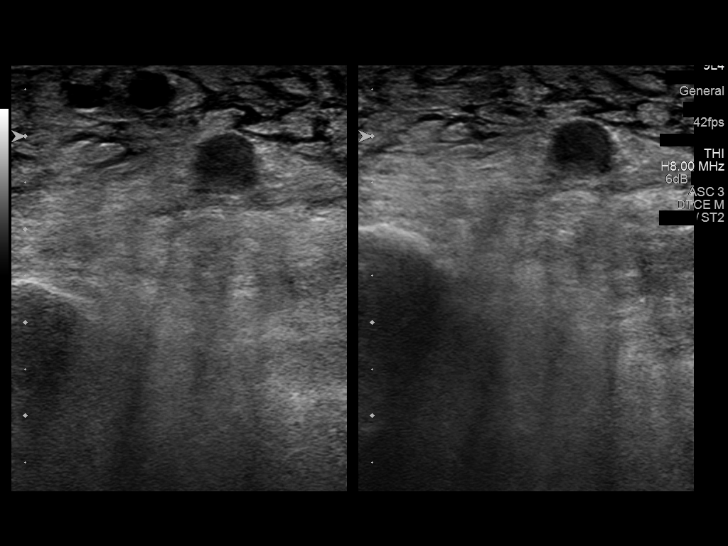
[im 38/58]
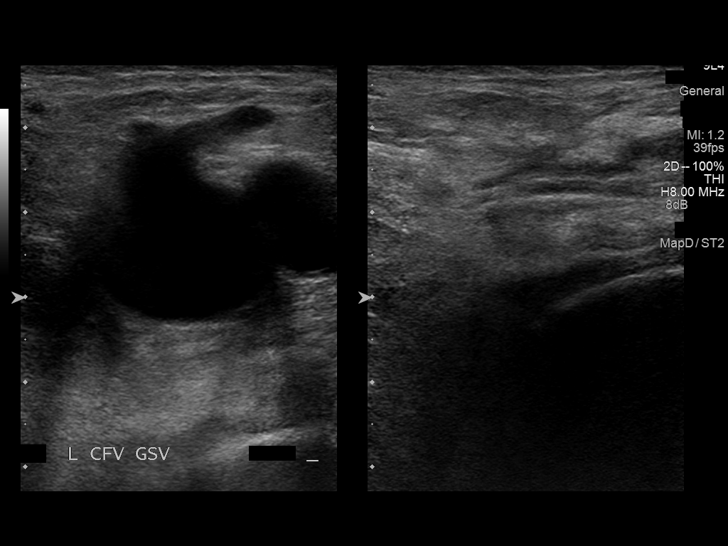
[im 43/58]
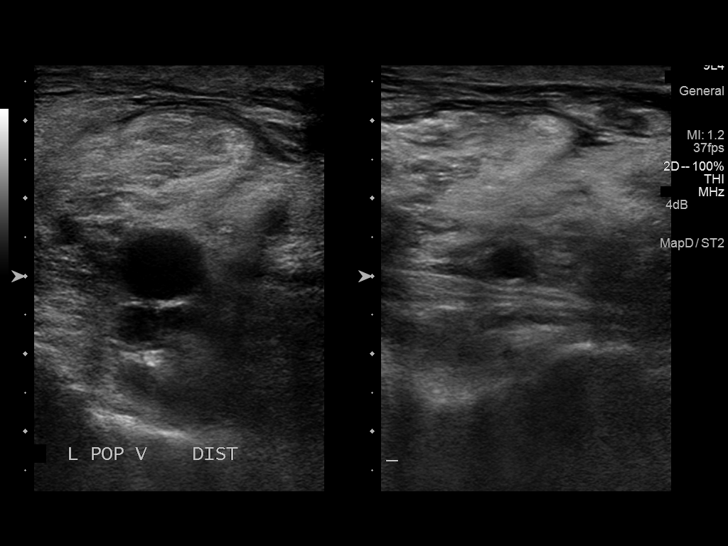
[im 48/58]
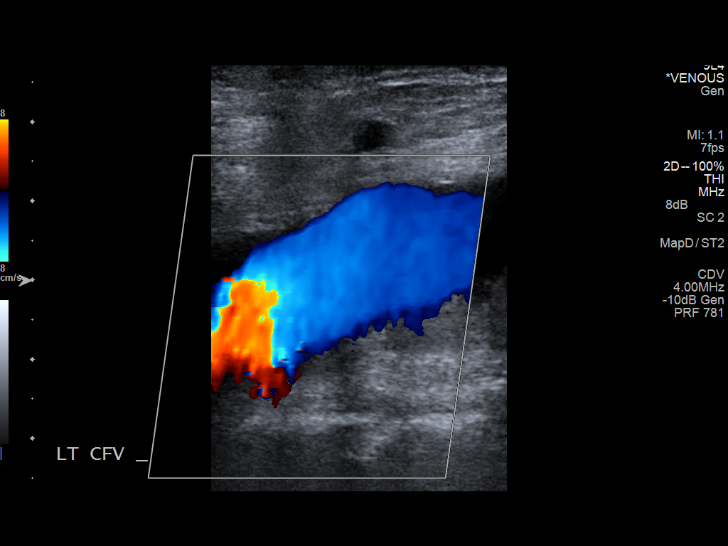
[im 53/58]
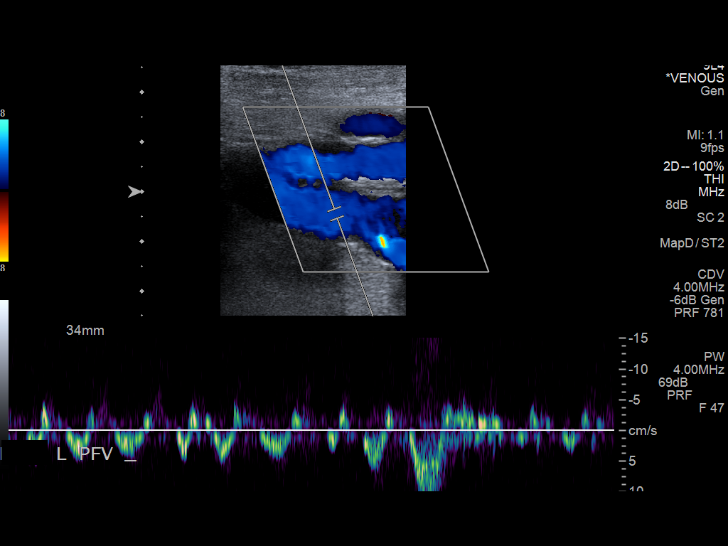
[im 58/58]
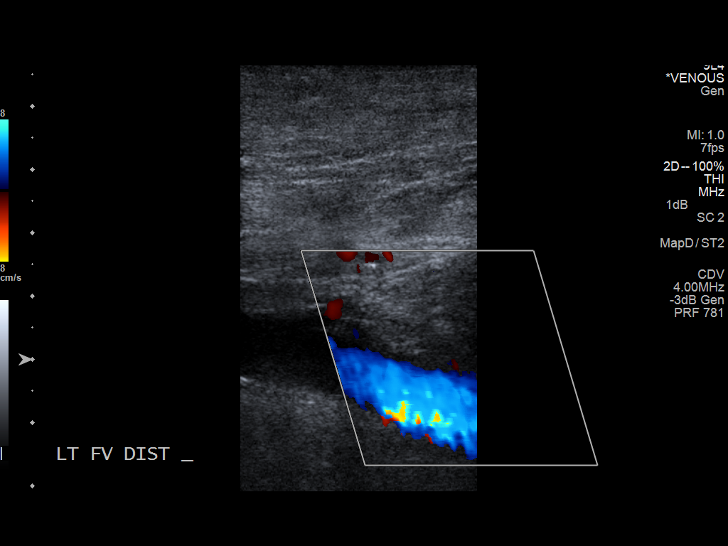

[13 of 24 positions shown; findings below may reference images not displayed]

FINDINGS: On the right, there is incomplete compressibility of the femoral
vein throughout its length with eccentric thrombus identified. There
is eccentric thrombus in the popliteal vein resulting in incomplete
compressibility. There is antegrade flow through these segments.
There is incompletely occlusive thrombus in the profunda femoral
vein. Common femoral vein is widely patent, uninvolved by thrombus.
Saphenofemoral junction patent. Augmentation was not attempted.
Segments of the right greater saphenous vein in the midthigh, knee,
and calf are thrombosed and noncompressible.

On the left, Normal compressibility of the common femoral,
superficial femoral, and popliteal veins, as well as the proximal
calf veins. No filling defects to suggest DVT on grayscale or color
Doppler imaging. Doppler waveforms show normal direction of venous
flow, normal respiratory phasicity and response to augmentation.
IMPRESSION: 1. Nonocclusive DVT in right profunda femoral, superficial femoral,
and popliteal veins.
2. Superficial thrombophlebitis involving right greater saphenous
vein.
3. No left  lower extremity deep vein thrombosis.

## 2015-09-24 ENCOUNTER — Inpatient Hospital Stay: Payer: Federal, State, Local not specified - PPO | Admitting: Oncology

## 2015-09-24 ENCOUNTER — Inpatient Hospital Stay: Payer: Federal, State, Local not specified - PPO

## 2015-09-28 DIAGNOSIS — K7689 Other specified diseases of liver: Secondary | ICD-10-CM | POA: Diagnosis not present

## 2015-09-28 DIAGNOSIS — B182 Chronic viral hepatitis C: Secondary | ICD-10-CM | POA: Diagnosis not present

## 2015-09-28 DIAGNOSIS — B20 Human immunodeficiency virus [HIV] disease: Secondary | ICD-10-CM | POA: Diagnosis not present

## 2015-09-28 DIAGNOSIS — I4891 Unspecified atrial fibrillation: Secondary | ICD-10-CM | POA: Diagnosis not present

## 2015-10-01 ENCOUNTER — Inpatient Hospital Stay: Payer: Federal, State, Local not specified - PPO

## 2015-10-01 ENCOUNTER — Inpatient Hospital Stay: Payer: Federal, State, Local not specified - PPO | Admitting: Oncology

## 2015-10-05 DIAGNOSIS — J209 Acute bronchitis, unspecified: Secondary | ICD-10-CM | POA: Diagnosis not present

## 2015-10-05 DIAGNOSIS — R05 Cough: Secondary | ICD-10-CM | POA: Diagnosis not present

## 2015-10-05 DIAGNOSIS — T375X1A Poisoning by antiviral drugs, accidental (unintentional), initial encounter: Secondary | ICD-10-CM | POA: Diagnosis not present

## 2015-10-05 DIAGNOSIS — B20 Human immunodeficiency virus [HIV] disease: Secondary | ICD-10-CM | POA: Diagnosis not present

## 2015-10-08 ENCOUNTER — Other Ambulatory Visit: Payer: Self-pay | Admitting: Internal Medicine

## 2015-10-08 ENCOUNTER — Ambulatory Visit
Admission: RE | Admit: 2015-10-08 | Discharge: 2015-10-08 | Disposition: A | Discharge status: Home / Self Care | Payer: Medicare Other | Source: Ambulatory Visit | Attending: Cardiology | Admitting: Cardiology

## 2015-10-08 ENCOUNTER — Ambulatory Visit
Admission: RE | Admit: 2015-10-08 | Discharge: 2015-10-08 | Disposition: A | Discharge status: Home / Self Care | Payer: Medicare Other | Source: Ambulatory Visit | Attending: Internal Medicine | Admitting: Internal Medicine

## 2015-10-08 DIAGNOSIS — J690 Pneumonitis due to inhalation of food and vomit: Secondary | ICD-10-CM

## 2015-10-08 DIAGNOSIS — R918 Other nonspecific abnormal finding of lung field: Secondary | ICD-10-CM | POA: Insufficient documentation

## 2015-10-08 DIAGNOSIS — I517 Cardiomegaly: Secondary | ICD-10-CM | POA: Insufficient documentation

## 2015-10-08 DIAGNOSIS — J90 Pleural effusion, not elsewhere classified: Secondary | ICD-10-CM | POA: Diagnosis not present

## 2015-10-08 DIAGNOSIS — I119 Hypertensive heart disease without heart failure: Secondary | ICD-10-CM | POA: Diagnosis not present

## 2015-10-08 DIAGNOSIS — I509 Heart failure, unspecified: Secondary | ICD-10-CM | POA: Diagnosis not present

## 2015-10-08 DIAGNOSIS — R0989 Other specified symptoms and signs involving the circulatory and respiratory systems: Secondary | ICD-10-CM | POA: Diagnosis not present

## 2015-10-08 DIAGNOSIS — I4891 Unspecified atrial fibrillation: Secondary | ICD-10-CM | POA: Diagnosis not present

## 2015-10-19 DIAGNOSIS — I119 Hypertensive heart disease without heart failure: Secondary | ICD-10-CM | POA: Diagnosis not present

## 2015-10-19 DIAGNOSIS — I509 Heart failure, unspecified: Secondary | ICD-10-CM | POA: Diagnosis not present

## 2015-10-19 DIAGNOSIS — J90 Pleural effusion, not elsewhere classified: Secondary | ICD-10-CM | POA: Diagnosis not present

## 2015-10-23 DIAGNOSIS — Z95810 Presence of automatic (implantable) cardiac defibrillator: Secondary | ICD-10-CM | POA: Diagnosis not present

## 2015-10-23 DIAGNOSIS — I472 Ventricular tachycardia: Secondary | ICD-10-CM | POA: Diagnosis not present

## 2015-10-31 ENCOUNTER — Ambulatory Visit
Admission: RE | Admit: 2015-10-31 | Discharge: 2015-10-31 | Disposition: A | Discharge status: Home / Self Care | Payer: Medicare Other | Source: Ambulatory Visit | Attending: Cardiology | Admitting: Cardiology

## 2015-10-31 ENCOUNTER — Other Ambulatory Visit: Payer: Self-pay | Admitting: Internal Medicine

## 2015-10-31 ENCOUNTER — Ambulatory Visit
Admission: RE | Admit: 2015-10-31 | Discharge: 2015-10-31 | Disposition: A | Discharge status: Home / Self Care | Payer: Medicare Other | Source: Ambulatory Visit | Attending: Internal Medicine | Admitting: Internal Medicine

## 2015-10-31 DIAGNOSIS — J90 Pleural effusion, not elsewhere classified: Secondary | ICD-10-CM | POA: Diagnosis not present

## 2015-10-31 DIAGNOSIS — R0602 Shortness of breath: Secondary | ICD-10-CM | POA: Diagnosis not present

## 2015-10-31 DIAGNOSIS — J40 Bronchitis, not specified as acute or chronic: Secondary | ICD-10-CM | POA: Diagnosis not present

## 2015-10-31 DIAGNOSIS — M5412 Radiculopathy, cervical region: Secondary | ICD-10-CM | POA: Diagnosis not present

## 2015-10-31 DIAGNOSIS — I509 Heart failure, unspecified: Secondary | ICD-10-CM | POA: Diagnosis not present

## 2015-10-31 DIAGNOSIS — B20 Human immunodeficiency virus [HIV] disease: Secondary | ICD-10-CM | POA: Diagnosis not present

## 2015-11-13 DIAGNOSIS — M792 Neuralgia and neuritis, unspecified: Secondary | ICD-10-CM | POA: Diagnosis not present

## 2015-11-13 DIAGNOSIS — I4891 Unspecified atrial fibrillation: Secondary | ICD-10-CM | POA: Diagnosis not present

## 2015-11-13 DIAGNOSIS — T375X1A Poisoning by antiviral drugs, accidental (unintentional), initial encounter: Secondary | ICD-10-CM | POA: Diagnosis not present

## 2015-11-13 DIAGNOSIS — E119 Type 2 diabetes mellitus without complications: Secondary | ICD-10-CM | POA: Diagnosis not present

## 2015-11-14 DIAGNOSIS — H40003 Preglaucoma, unspecified, bilateral: Secondary | ICD-10-CM | POA: Diagnosis not present

## 2015-11-19 DIAGNOSIS — E113593 Type 2 diabetes mellitus with proliferative diabetic retinopathy without macular edema, bilateral: Secondary | ICD-10-CM | POA: Diagnosis not present

## 2015-11-22 DIAGNOSIS — N183 Chronic kidney disease, stage 3 (moderate): Secondary | ICD-10-CM | POA: Diagnosis not present

## 2015-11-22 DIAGNOSIS — I2510 Atherosclerotic heart disease of native coronary artery without angina pectoris: Secondary | ICD-10-CM | POA: Diagnosis not present

## 2015-11-22 DIAGNOSIS — I10 Essential (primary) hypertension: Secondary | ICD-10-CM | POA: Diagnosis not present

## 2015-11-22 DIAGNOSIS — R5383 Other fatigue: Secondary | ICD-10-CM | POA: Diagnosis not present

## 2015-11-22 DIAGNOSIS — I429 Cardiomyopathy, unspecified: Secondary | ICD-10-CM | POA: Diagnosis not present

## 2015-11-22 DIAGNOSIS — Z95810 Presence of automatic (implantable) cardiac defibrillator: Secondary | ICD-10-CM | POA: Diagnosis not present

## 2015-11-22 DIAGNOSIS — I480 Paroxysmal atrial fibrillation: Secondary | ICD-10-CM | POA: Diagnosis not present

## 2015-11-22 DIAGNOSIS — I493 Ventricular premature depolarization: Secondary | ICD-10-CM | POA: Diagnosis not present

## 2015-11-22 DIAGNOSIS — R350 Frequency of micturition: Secondary | ICD-10-CM | POA: Diagnosis not present

## 2015-11-22 DIAGNOSIS — I255 Ischemic cardiomyopathy: Secondary | ICD-10-CM | POA: Diagnosis not present

## 2015-11-22 DIAGNOSIS — I5022 Chronic systolic (congestive) heart failure: Secondary | ICD-10-CM | POA: Diagnosis not present

## 2015-11-22 DIAGNOSIS — I519 Heart disease, unspecified: Secondary | ICD-10-CM | POA: Diagnosis not present

## 2015-11-22 DIAGNOSIS — I209 Angina pectoris, unspecified: Secondary | ICD-10-CM | POA: Diagnosis not present

## 2015-11-22 DIAGNOSIS — E1122 Type 2 diabetes mellitus with diabetic chronic kidney disease: Secondary | ICD-10-CM | POA: Diagnosis not present

## 2015-12-04 DIAGNOSIS — D631 Anemia in chronic kidney disease: Secondary | ICD-10-CM | POA: Diagnosis not present

## 2015-12-04 DIAGNOSIS — E875 Hyperkalemia: Secondary | ICD-10-CM | POA: Diagnosis not present

## 2015-12-04 DIAGNOSIS — R809 Proteinuria, unspecified: Secondary | ICD-10-CM | POA: Diagnosis not present

## 2015-12-04 DIAGNOSIS — N183 Chronic kidney disease, stage 3 (moderate): Secondary | ICD-10-CM | POA: Diagnosis not present

## 2015-12-04 DIAGNOSIS — R600 Localized edema: Secondary | ICD-10-CM | POA: Diagnosis not present

## 2015-12-13 DIAGNOSIS — I509 Heart failure, unspecified: Secondary | ICD-10-CM | POA: Diagnosis not present

## 2015-12-13 DIAGNOSIS — E8881 Metabolic syndrome: Secondary | ICD-10-CM | POA: Diagnosis not present

## 2015-12-13 DIAGNOSIS — I4891 Unspecified atrial fibrillation: Secondary | ICD-10-CM | POA: Diagnosis not present

## 2015-12-13 DIAGNOSIS — M792 Neuralgia and neuritis, unspecified: Secondary | ICD-10-CM | POA: Diagnosis not present

## 2015-12-13 DIAGNOSIS — J90 Pleural effusion, not elsewhere classified: Secondary | ICD-10-CM | POA: Diagnosis not present

## 2016-01-10 DIAGNOSIS — M792 Neuralgia and neuritis, unspecified: Secondary | ICD-10-CM | POA: Diagnosis not present

## 2016-01-10 DIAGNOSIS — I509 Heart failure, unspecified: Secondary | ICD-10-CM | POA: Diagnosis not present

## 2016-01-10 DIAGNOSIS — B20 Human immunodeficiency virus [HIV] disease: Secondary | ICD-10-CM | POA: Diagnosis not present

## 2016-01-10 DIAGNOSIS — I4891 Unspecified atrial fibrillation: Secondary | ICD-10-CM | POA: Diagnosis not present

## 2016-01-16 DIAGNOSIS — E113593 Type 2 diabetes mellitus with proliferative diabetic retinopathy without macular edema, bilateral: Secondary | ICD-10-CM | POA: Diagnosis not present

## 2016-02-11 DIAGNOSIS — M792 Neuralgia and neuritis, unspecified: Secondary | ICD-10-CM | POA: Diagnosis not present

## 2016-02-11 DIAGNOSIS — I4891 Unspecified atrial fibrillation: Secondary | ICD-10-CM | POA: Diagnosis not present

## 2016-02-11 DIAGNOSIS — N183 Chronic kidney disease, stage 3 (moderate): Secondary | ICD-10-CM | POA: Diagnosis not present

## 2016-02-11 DIAGNOSIS — I509 Heart failure, unspecified: Secondary | ICD-10-CM | POA: Diagnosis not present

## 2016-02-13 DIAGNOSIS — E113591 Type 2 diabetes mellitus with proliferative diabetic retinopathy without macular edema, right eye: Secondary | ICD-10-CM | POA: Diagnosis not present

## 2016-03-04 DIAGNOSIS — N2581 Secondary hyperparathyroidism of renal origin: Secondary | ICD-10-CM | POA: Diagnosis not present

## 2016-03-04 DIAGNOSIS — R809 Proteinuria, unspecified: Secondary | ICD-10-CM | POA: Diagnosis not present

## 2016-03-04 DIAGNOSIS — D631 Anemia in chronic kidney disease: Secondary | ICD-10-CM | POA: Diagnosis not present

## 2016-03-04 DIAGNOSIS — E1122 Type 2 diabetes mellitus with diabetic chronic kidney disease: Secondary | ICD-10-CM | POA: Diagnosis not present

## 2016-03-04 DIAGNOSIS — N184 Chronic kidney disease, stage 4 (severe): Secondary | ICD-10-CM | POA: Diagnosis not present

## 2016-03-21 DIAGNOSIS — I2510 Atherosclerotic heart disease of native coronary artery without angina pectoris: Secondary | ICD-10-CM | POA: Diagnosis not present

## 2016-03-21 DIAGNOSIS — B20 Human immunodeficiency virus [HIV] disease: Secondary | ICD-10-CM | POA: Diagnosis not present

## 2016-03-21 DIAGNOSIS — I4891 Unspecified atrial fibrillation: Secondary | ICD-10-CM | POA: Diagnosis not present

## 2016-03-21 DIAGNOSIS — I739 Peripheral vascular disease, unspecified: Secondary | ICD-10-CM | POA: Diagnosis not present

## 2016-03-25 DIAGNOSIS — Z95810 Presence of automatic (implantable) cardiac defibrillator: Secondary | ICD-10-CM | POA: Diagnosis not present

## 2016-03-25 DIAGNOSIS — I472 Ventricular tachycardia: Secondary | ICD-10-CM | POA: Diagnosis not present

## 2016-03-28 DIAGNOSIS — E113591 Type 2 diabetes mellitus with proliferative diabetic retinopathy without macular edema, right eye: Secondary | ICD-10-CM | POA: Diagnosis not present

## 2016-04-01 DIAGNOSIS — I493 Ventricular premature depolarization: Secondary | ICD-10-CM | POA: Diagnosis not present

## 2016-04-01 DIAGNOSIS — I480 Paroxysmal atrial fibrillation: Secondary | ICD-10-CM | POA: Diagnosis not present

## 2016-04-01 DIAGNOSIS — I429 Cardiomyopathy, unspecified: Secondary | ICD-10-CM | POA: Diagnosis not present

## 2016-04-01 DIAGNOSIS — I5022 Chronic systolic (congestive) heart failure: Secondary | ICD-10-CM | POA: Diagnosis not present

## 2016-04-01 DIAGNOSIS — Z955 Presence of coronary angioplasty implant and graft: Secondary | ICD-10-CM | POA: Diagnosis not present

## 2016-04-01 DIAGNOSIS — I255 Ischemic cardiomyopathy: Secondary | ICD-10-CM | POA: Diagnosis not present

## 2016-04-01 DIAGNOSIS — I10 Essential (primary) hypertension: Secondary | ICD-10-CM | POA: Diagnosis not present

## 2016-04-01 DIAGNOSIS — I519 Heart disease, unspecified: Secondary | ICD-10-CM | POA: Diagnosis not present

## 2016-04-01 DIAGNOSIS — R0602 Shortness of breath: Secondary | ICD-10-CM | POA: Diagnosis not present

## 2016-04-01 DIAGNOSIS — I2510 Atherosclerotic heart disease of native coronary artery without angina pectoris: Secondary | ICD-10-CM | POA: Diagnosis not present

## 2016-04-01 DIAGNOSIS — E7800 Pure hypercholesterolemia, unspecified: Secondary | ICD-10-CM | POA: Diagnosis not present

## 2016-04-01 DIAGNOSIS — Z95810 Presence of automatic (implantable) cardiac defibrillator: Secondary | ICD-10-CM | POA: Diagnosis not present

## 2016-04-01 DIAGNOSIS — N289 Disorder of kidney and ureter, unspecified: Secondary | ICD-10-CM | POA: Diagnosis not present

## 2016-04-18 DIAGNOSIS — B20 Human immunodeficiency virus [HIV] disease: Secondary | ICD-10-CM | POA: Diagnosis not present

## 2016-04-28 DIAGNOSIS — T375X1A Poisoning by antiviral drugs, accidental (unintentional), initial encounter: Secondary | ICD-10-CM | POA: Diagnosis not present

## 2016-04-28 DIAGNOSIS — I209 Angina pectoris, unspecified: Secondary | ICD-10-CM | POA: Diagnosis not present

## 2016-04-28 DIAGNOSIS — I4891 Unspecified atrial fibrillation: Secondary | ICD-10-CM | POA: Diagnosis not present

## 2016-04-28 DIAGNOSIS — E8881 Metabolic syndrome: Secondary | ICD-10-CM | POA: Diagnosis not present

## 2016-05-01 DIAGNOSIS — I10 Essential (primary) hypertension: Secondary | ICD-10-CM | POA: Diagnosis not present

## 2016-05-14 DIAGNOSIS — J209 Acute bronchitis, unspecified: Secondary | ICD-10-CM | POA: Diagnosis not present

## 2016-05-14 DIAGNOSIS — I10 Essential (primary) hypertension: Secondary | ICD-10-CM | POA: Diagnosis not present

## 2016-05-14 DIAGNOSIS — K7689 Other specified diseases of liver: Secondary | ICD-10-CM | POA: Diagnosis not present

## 2016-05-14 DIAGNOSIS — I509 Heart failure, unspecified: Secondary | ICD-10-CM | POA: Diagnosis not present

## 2016-05-21 DIAGNOSIS — E113591 Type 2 diabetes mellitus with proliferative diabetic retinopathy without macular edema, right eye: Secondary | ICD-10-CM | POA: Diagnosis not present

## 2016-05-23 ENCOUNTER — Observation Stay
Admission: EM | Admit: 2016-05-23 | Discharge: 2016-05-24 | Disposition: A | Discharge status: Home / Self Care | Payer: Medicare Other | Attending: Internal Medicine | Admitting: Internal Medicine

## 2016-05-23 ENCOUNTER — Emergency Department: Payer: Medicare Other

## 2016-05-23 DIAGNOSIS — J329 Chronic sinusitis, unspecified: Secondary | ICD-10-CM | POA: Insufficient documentation

## 2016-05-23 DIAGNOSIS — I4891 Unspecified atrial fibrillation: Secondary | ICD-10-CM | POA: Insufficient documentation

## 2016-05-23 DIAGNOSIS — Z955 Presence of coronary angioplasty implant and graft: Secondary | ICD-10-CM | POA: Diagnosis not present

## 2016-05-23 DIAGNOSIS — N179 Acute kidney failure, unspecified: Secondary | ICD-10-CM | POA: Diagnosis not present

## 2016-05-23 DIAGNOSIS — I129 Hypertensive chronic kidney disease with stage 1 through stage 4 chronic kidney disease, or unspecified chronic kidney disease: Secondary | ICD-10-CM | POA: Diagnosis not present

## 2016-05-23 DIAGNOSIS — Z7901 Long term (current) use of anticoagulants: Secondary | ICD-10-CM | POA: Diagnosis not present

## 2016-05-23 DIAGNOSIS — Z7902 Long term (current) use of antithrombotics/antiplatelets: Secondary | ICD-10-CM | POA: Insufficient documentation

## 2016-05-23 DIAGNOSIS — Z882 Allergy status to sulfonamides status: Secondary | ICD-10-CM | POA: Diagnosis not present

## 2016-05-23 DIAGNOSIS — I700 Atherosclerosis of aorta: Secondary | ICD-10-CM | POA: Insufficient documentation

## 2016-05-23 DIAGNOSIS — Z9049 Acquired absence of other specified parts of digestive tract: Secondary | ICD-10-CM | POA: Diagnosis not present

## 2016-05-23 DIAGNOSIS — Z79899 Other long term (current) drug therapy: Secondary | ICD-10-CM | POA: Insufficient documentation

## 2016-05-23 DIAGNOSIS — I509 Heart failure, unspecified: Secondary | ICD-10-CM | POA: Insufficient documentation

## 2016-05-23 DIAGNOSIS — R531 Weakness: Secondary | ICD-10-CM | POA: Diagnosis not present

## 2016-05-23 DIAGNOSIS — Z8249 Family history of ischemic heart disease and other diseases of the circulatory system: Secondary | ICD-10-CM | POA: Insufficient documentation

## 2016-05-23 DIAGNOSIS — Z888 Allergy status to other drugs, medicaments and biological substances status: Secondary | ICD-10-CM | POA: Diagnosis not present

## 2016-05-23 DIAGNOSIS — I2510 Atherosclerotic heart disease of native coronary artery without angina pectoris: Secondary | ICD-10-CM | POA: Insufficient documentation

## 2016-05-23 DIAGNOSIS — E1122 Type 2 diabetes mellitus with diabetic chronic kidney disease: Secondary | ICD-10-CM | POA: Diagnosis not present

## 2016-05-23 DIAGNOSIS — B20 Human immunodeficiency virus [HIV] disease: Secondary | ICD-10-CM | POA: Diagnosis not present

## 2016-05-23 DIAGNOSIS — E119 Type 2 diabetes mellitus without complications: Secondary | ICD-10-CM | POA: Diagnosis not present

## 2016-05-23 DIAGNOSIS — N183 Chronic kidney disease, stage 3 (moderate): Secondary | ICD-10-CM | POA: Insufficient documentation

## 2016-05-23 DIAGNOSIS — I130 Hypertensive heart and chronic kidney disease with heart failure and stage 1 through stage 4 chronic kidney disease, or unspecified chronic kidney disease: Secondary | ICD-10-CM | POA: Diagnosis not present

## 2016-05-23 DIAGNOSIS — B1920 Unspecified viral hepatitis C without hepatic coma: Secondary | ICD-10-CM | POA: Diagnosis not present

## 2016-05-23 DIAGNOSIS — N189 Chronic kidney disease, unspecified: Secondary | ICD-10-CM

## 2016-05-23 DIAGNOSIS — Z9889 Other specified postprocedural states: Secondary | ICD-10-CM | POA: Insufficient documentation

## 2016-05-23 DIAGNOSIS — I517 Cardiomegaly: Secondary | ICD-10-CM | POA: Diagnosis not present

## 2016-05-23 DIAGNOSIS — Z885 Allergy status to narcotic agent status: Secondary | ICD-10-CM | POA: Diagnosis not present

## 2016-05-23 DIAGNOSIS — F4310 Post-traumatic stress disorder, unspecified: Secondary | ICD-10-CM | POA: Insufficient documentation

## 2016-05-23 DIAGNOSIS — R002 Palpitations: Secondary | ICD-10-CM | POA: Diagnosis not present

## 2016-05-23 DIAGNOSIS — D72829 Elevated white blood cell count, unspecified: Secondary | ICD-10-CM | POA: Diagnosis not present

## 2016-05-23 DIAGNOSIS — R0602 Shortness of breath: Secondary | ICD-10-CM | POA: Diagnosis not present

## 2016-05-23 DIAGNOSIS — J180 Bronchopneumonia, unspecified organism: Secondary | ICD-10-CM | POA: Diagnosis not present

## 2016-05-23 DIAGNOSIS — E875 Hyperkalemia: Secondary | ICD-10-CM | POA: Diagnosis not present

## 2016-05-23 DIAGNOSIS — F419 Anxiety disorder, unspecified: Secondary | ICD-10-CM | POA: Diagnosis not present

## 2016-05-23 LAB — COMPREHENSIVE METABOLIC PANEL
ALT: 11 U/L — AB (ref 17–63)
ANION GAP: 9 (ref 5–15)
AST: 17 U/L (ref 15–41)
Albumin: 3.4 g/dL — ABNORMAL LOW (ref 3.5–5.0)
Alkaline Phosphatase: 89 U/L (ref 38–126)
BUN: 40 mg/dL — ABNORMAL HIGH (ref 6–20)
CHLORIDE: 96 mmol/L — AB (ref 101–111)
CO2: 28 mmol/L (ref 22–32)
CREATININE: 3.19 mg/dL — AB (ref 0.61–1.24)
Calcium: 8.5 mg/dL — ABNORMAL LOW (ref 8.9–10.3)
GFR, EST AFRICAN AMERICAN: 21 mL/min — AB (ref >60)
GFR, EST NON AFRICAN AMERICAN: 18 mL/min — AB (ref >60)
Glucose, Bld: 177 mg/dL — ABNORMAL HIGH (ref 65–99)
POTASSIUM: 5.5 mmol/L — AB (ref 3.5–5.1)
SODIUM: 133 mmol/L — AB (ref 135–145)
Total Bilirubin: 0.7 mg/dL (ref 0.3–1.2)
Total Protein: 8.2 g/dL — ABNORMAL HIGH (ref 6.5–8.1)

## 2016-05-23 LAB — URINALYSIS COMPLETE WITH MICROSCOPIC (ARMC ONLY)
BILIRUBIN URINE: NEGATIVE
Bacteria, UA: NONE SEEN
GLUCOSE, UA: 150 mg/dL — AB
Hgb urine dipstick: NEGATIVE
Ketones, ur: NEGATIVE mg/dL
LEUKOCYTES UA: NEGATIVE
NITRITE: NEGATIVE
SPECIFIC GRAVITY, URINE: 1.016 (ref 1.005–1.030)
Squamous Epithelial / LPF: NONE SEEN
pH: 7 (ref 5.0–8.0)

## 2016-05-23 LAB — CBC WITH DIFFERENTIAL/PLATELET
BASOS ABS: 0.1 10*3/uL (ref 0–0.1)
Basophils Relative: 1 %
Eosinophils Absolute: 0 10*3/uL (ref 0–0.7)
Eosinophils Relative: 0 %
HEMATOCRIT: 32.5 % — AB (ref 40.0–52.0)
HEMOGLOBIN: 11.3 g/dL — AB (ref 13.0–18.0)
LYMPHS PCT: 16 %
Lymphs Abs: 2.1 10*3/uL (ref 1.0–3.6)
MCH: 36.2 pg — AB (ref 26.0–34.0)
MCHC: 34.8 g/dL (ref 32.0–36.0)
MCV: 103.9 fL — AB (ref 80.0–100.0)
MONO ABS: 1 10*3/uL (ref 0.2–1.0)
Monocytes Relative: 8 %
NEUTROS ABS: 10.1 10*3/uL — AB (ref 1.4–6.5)
NEUTROS PCT: 75 %
PLATELETS: 148 10*3/uL — AB (ref 150–440)
RBC: 3.13 MIL/uL — AB (ref 4.40–5.90)
RDW: 14.7 % — AB (ref 11.5–14.5)
WBC: 13.4 10*3/uL — ABNORMAL HIGH (ref 3.8–10.6)

## 2016-05-23 LAB — GLUCOSE, CAPILLARY
GLUCOSE-CAPILLARY: 179 mg/dL — AB (ref 65–99)
Glucose-Capillary: 137 mg/dL — ABNORMAL HIGH (ref 65–99)

## 2016-05-23 LAB — PROTIME-INR
INR: 1.55
Prothrombin Time: 18.7 seconds — ABNORMAL HIGH (ref 11.4–15.2)

## 2016-05-23 LAB — BRAIN NATRIURETIC PEPTIDE: B NATRIURETIC PEPTIDE 5: 349 pg/mL — AB (ref 0.0–100.0)

## 2016-05-23 LAB — CK: CK TOTAL: 58 U/L (ref 49–397)

## 2016-05-23 LAB — TROPONIN I: TROPONIN I: 0.03 ng/mL — AB (ref <0.03)

## 2016-05-23 MED ORDER — ACETAMINOPHEN 650 MG RE SUPP: 650.0000 mg | Freq: Four times a day (QID) | RECTAL | Status: DC | PRN

## 2016-05-23 MED ORDER — TIMOLOL MALEATE 0.25 % OP SOLN
1.0000 [drp] | Freq: Every day | OPHTHALMIC | Status: DC
  Filled 2016-05-23: qty 5

## 2016-05-23 MED ORDER — SODIUM POLYSTYRENE SULFONATE 15 GM/60ML PO SUSP
30.0000 g | Freq: Once | ORAL | Status: AC
  Administered 2016-05-23: 30 g via ORAL
  Filled 2016-05-23: qty 120

## 2016-05-23 MED ORDER — LACTULOSE 10 GM/15ML PO SOLN: 20.0000 g | Freq: Two times a day (BID) | ORAL | Status: DC | PRN

## 2016-05-23 MED ORDER — ACETAMINOPHEN 325 MG PO TABS: 650.0000 mg | ORAL_TABLET | Freq: Four times a day (QID) | ORAL | Status: DC | PRN

## 2016-05-23 MED ORDER — LAMIVUDINE 150 MG PO TABS
150.0000 mg | ORAL_TABLET | Freq: Every day | ORAL | Status: DC
  Filled 2016-05-23 (×2): qty 1

## 2016-05-23 MED ORDER — INSULIN ASPART 100 UNIT/ML ~~LOC~~ SOLN: 0.0000 [IU] | Freq: Every day | SUBCUTANEOUS | Status: DC

## 2016-05-23 MED ORDER — WARFARIN SODIUM 5 MG PO TABS
5.0000 mg | ORAL_TABLET | ORAL | Status: DC
  Filled 2016-05-23: qty 1

## 2016-05-23 MED ORDER — VITAMIN D 1000 UNITS PO TABS
2000.0000 [IU] | ORAL_TABLET | Freq: Every day | ORAL | Status: DC
  Filled 2016-05-23: qty 2

## 2016-05-23 MED ORDER — DOLUTEGRAVIR SODIUM 50 MG PO TABS
50.0000 mg | ORAL_TABLET | Freq: Every day | ORAL | Status: DC
  Filled 2016-05-23 (×2): qty 1

## 2016-05-23 MED ORDER — POLYETHYLENE GLYCOL 3350 17 G PO PACK: 17.0000 g | PACK | Freq: Every day | ORAL | Status: DC | PRN

## 2016-05-23 MED ORDER — ADULT MULTIVITAMIN W/MINERALS CH
1.0000 | ORAL_TABLET | Freq: Every day | ORAL | Status: DC
  Filled 2016-05-23: qty 1

## 2016-05-23 MED ORDER — ISOSORBIDE MONONITRATE 15 MG HALF TABLET
15.0000 mg | ORAL_TABLET | ORAL | Status: DC
  Filled 2016-05-23: qty 1

## 2016-05-23 MED ORDER — GLIMEPIRIDE 2 MG PO TABS: 2.0000 mg | ORAL_TABLET | Freq: Every day | ORAL | Status: DC | PRN

## 2016-05-23 MED ORDER — TIMOLOL MALEATE 0.5 % OP SOLN
1.0000 [drp] | Freq: Every day | OPHTHALMIC | Status: DC
  Administered 2016-05-23: 1 [drp] via OPHTHALMIC
  Filled 2016-05-23: qty 5

## 2016-05-23 MED ORDER — CLOPIDOGREL BISULFATE 75 MG PO TABS
75.0000 mg | ORAL_TABLET | Freq: Every day | ORAL | Status: DC
  Filled 2016-05-23: qty 1

## 2016-05-23 MED ORDER — NITROGLYCERIN 0.4 MG SL SUBL: 0.4000 mg | SUBLINGUAL_TABLET | SUBLINGUAL | Status: DC | PRN

## 2016-05-23 MED ORDER — CARVEDILOL 6.25 MG PO TABS
6.2500 mg | ORAL_TABLET | Freq: Two times a day (BID) | ORAL | Status: DC
  Administered 2016-05-23: 6.25 mg via ORAL
  Filled 2016-05-23 (×2): qty 1

## 2016-05-23 MED ORDER — DOCUSATE SODIUM 100 MG PO CAPS
100.0000 mg | ORAL_CAPSULE | Freq: Two times a day (BID) | ORAL | Status: DC
  Filled 2016-05-23 (×2): qty 1

## 2016-05-23 MED ORDER — WARFARIN - PHYSICIAN DOSING INPATIENT: Freq: Every day | Status: DC

## 2016-05-23 MED ORDER — LATANOPROST 0.005 % OP SOLN
1.0000 [drp] | Freq: Every day | OPHTHALMIC | Status: DC
  Administered 2016-05-23: 1 [drp] via OPHTHALMIC
  Filled 2016-05-23: qty 2.5

## 2016-05-23 MED ORDER — ONDANSETRON HCL 4 MG/2ML IJ SOLN: 4.0000 mg | Freq: Four times a day (QID) | INTRAMUSCULAR | Status: DC | PRN

## 2016-05-23 MED ORDER — WARFARIN SODIUM 5 MG PO TABS
5.0000 mg | ORAL_TABLET | ORAL | Status: DC
  Administered 2016-05-23: 5 mg via ORAL

## 2016-05-23 MED ORDER — ALBUTEROL SULFATE (2.5 MG/3ML) 0.083% IN NEBU
2.5000 mg | INHALATION_SOLUTION | RESPIRATORY_TRACT | Status: DC | PRN
Start: 2016-05-23 — End: 2016-05-24

## 2016-05-23 MED ORDER — INSULIN ASPART 100 UNIT/ML ~~LOC~~ SOLN: 0.0000 [IU] | Freq: Three times a day (TID) | SUBCUTANEOUS | Status: DC

## 2016-05-23 MED ORDER — WARFARIN SODIUM 5 MG PO TABS: 7.5000 mg | ORAL_TABLET | ORAL | Status: DC

## 2016-05-23 MED ORDER — ONDANSETRON HCL 4 MG PO TABS: 4.0000 mg | ORAL_TABLET | Freq: Four times a day (QID) | ORAL | Status: DC | PRN

## 2016-05-23 MED ORDER — SODIUM CHLORIDE 0.9 % IV BOLUS (SEPSIS)
1000.0000 mL | Freq: Once | INTRAVENOUS | Status: AC
  Administered 2016-05-23: 1000 mL via INTRAVENOUS

## 2016-05-23 MED ORDER — SODIUM CHLORIDE 0.9 % IV SOLN
INTRAVENOUS | Status: AC
  Administered 2016-05-23 – 2016-05-24 (×2): via INTRAVENOUS

## 2016-05-23 MED ORDER — SODIUM BICARBONATE 650 MG PO TABS
1300.0000 mg | ORAL_TABLET | Freq: Two times a day (BID) | ORAL | Status: DC
  Administered 2016-05-23: 1300 mg via ORAL
  Filled 2016-05-23 (×2): qty 2

## 2016-05-23 MED ORDER — ALPRAZOLAM 0.5 MG PO TABS: 0.5000 mg | ORAL_TABLET | Freq: Two times a day (BID) | ORAL | Status: DC | PRN

## 2016-05-23 MED ORDER — HYDROCODONE-ACETAMINOPHEN 10-325 MG PO TABS
1.0000 | ORAL_TABLET | Freq: Four times a day (QID) | ORAL | Status: DC | PRN
  Administered 2016-05-23 – 2016-05-24 (×2): 1 via ORAL
  Filled 2016-05-23 (×3): qty 1

## 2016-05-23 MED ORDER — HEPARIN SODIUM (PORCINE) 5000 UNIT/ML IJ SOLN: 5000.0000 [IU] | Freq: Three times a day (TID) | INTRAMUSCULAR | Status: DC

## 2016-05-23 MED ORDER — WARFARIN SODIUM 7.5 MG PO TABS: 7.5000 mg | ORAL_TABLET | ORAL | Status: DC

## 2016-05-23 NOTE — Progress Notes (Signed)
Notified Pharmacy that pt had dose of 7.5mg  coumadin yesterday and will be due 5mg  today

## 2016-05-23 NOTE — ED Notes (Signed)
Pt requested to use own home medications. Wife has gone to get medication and will bring them to inpatient unit. RN made aware on floor. Wife contacted and informed of patient transfer.

## 2016-05-23 NOTE — ED Notes (Signed)
Pt reports generalized weakness for several days.  Denies sob or cp.  Skin w/d and slightly pale.  No resp distress. Pt has defibrillator on right side of chest. Lungs diminished bilat. Denies fever. Grips equal, no facial droop or slurred speech.

## 2016-05-23 NOTE — ED Notes (Signed)
Ac chosen by pt preference

## 2016-05-23 NOTE — H&P (Signed)
[facility] [place] NAME: Robert Bentley    MR#:  [id number]  DATE OF BIRTH:  Jul 06, 1941  DATE OF ADMISSION:  05/23/2016  PRIMARY CARE PHYSICIAN: Cletis Athens, MD   REQUESTING/REFERRING PHYSICIAN: Dr. Kerman Passey  CHIEF COMPLAINT:   Chief Complaint  Patient presents with  . Shortness of Breath    HISTORY OF PRESENT ILLNESS:  Robert Bentley  is a 75 y.o. male with a known history of CKD stage IV, hypertension, diabetes, HIV, hepatitis C presents to the emergency room sent in by his primary care physician due to weakness and joint pains. Patient has felt warm but no fevers documented. Saturations are normal. No dysuria or rash. Here in the emergency room patient has been found to have mildly elevated leukocytosis of 13,000 along with acute kidney injury. Potassium is 5.5. He used Z-Pak for sinusitis 3 weeks back. No sinus congestion at this point. No recent travel.  PAST MEDICAL HISTORY:   Past Medical History:  Diagnosis Date  . Anxiety   . Atrial fibrillation ([place])   . CHF (congestive heart failure) ([place])   . Coronary artery disease   . Diabetes mellitus without complication ([place])   . Hepatitis C   . HIV disease ([place])   . Hypertension   . PTSD (post-traumatic stress disorder)   . Renal failure     PAST SURGICAL HISTORY:   Past Surgical History:  Procedure Laterality Date  . APPENDECTOMY    . cardiac stenting    . CHOLECYSTECTOMY    . EP IMPLANTABLE DEVICE    . FRACTURE SURGERY      SOCIAL HISTORY:   Social History  Substance Use Topics  . Smoking status: Never Smoker  . Smokeless tobacco: Never Used  . Alcohol use No    FAMILY HISTORY:   Family History  Problem Relation Age of Onset  . Heart failure Mother     DRUG ALLERGIES:   Allergies  Allergen Reactions  . Morphine Nausea And Vomiting, Rash and Other (See Comments)    Reactions: "gets violent"  . Efavirenz Other (See Comments)    Reaction:   Unknown   . Statins Hives  . Sulfamethizole Other (See Comments)    fever   . Morphine And Related Nausea And Vomiting and Rash    REVIEW OF SYSTEMS:   Review of Systems  Constitutional: Negative for chills, fever and weight loss.  HENT: Negative for hearing loss and nosebleeds.   Eyes: Negative for blurred vision, double vision and pain.  Respiratory: Negative for cough, hemoptysis, sputum production, shortness of breath and wheezing.   Cardiovascular: Negative for chest pain, palpitations, orthopnea and leg swelling.  Gastrointestinal: Positive for nausea. Negative for abdominal pain, constipation, diarrhea and vomiting.  Genitourinary: Negative for dysuria and hematuria.  Musculoskeletal: Positive for joint pain and myalgias. Negative for back pain and falls.  Skin: Negative for rash.  Neurological: Positive for weakness. Negative for dizziness, tremors, sensory change, speech change, focal weakness, seizures and headaches.  Endo/Heme/Allergies: Does not bruise/bleed easily.  Psychiatric/Behavioral: Negative for depression and memory loss. The patient is not nervous/anxious.     MEDICATIONS AT HOME:   Prior to Admission medications   Medication Sig Start Date End Date Taking? Authorizing Provider  ALPRAZolam Duanne Moron) 0.5 MG tablet Take 0.5 mg by mouth 2 (two) times daily as needed for anxiety.   Yes Historical Provider, MD  bisacodyl (DULCOLAX) 5 MG EC tablet Take 15 mg by mouth daily as  needed for moderate constipation.   Yes Historical Provider, MD  carvedilol (COREG) 6.25 MG tablet Take 6.25 mg by mouth 2 (two) times daily.   Yes Historical Provider, MD  Cholecalciferol (VITAMIN D3) 2000 UNITS TABS Take 1 tablet by mouth daily.   Yes Historical Provider, MD  clopidogrel (PLAVIX) 75 MG tablet Take 75 mg by mouth daily.   Yes Historical Provider, MD  dolutegravir (TIVICAY) 50 MG tablet Take 50 mg by mouth daily.   Yes Historical Provider, MD  furosemide (LASIX) 80 MG tablet Take  80 mg by mouth 2 (two) times daily.   Yes Historical Provider, MD  glimepiride (AMARYL) 2 MG tablet Take 2 mg by mouth daily as needed (for high blood sugar).   Yes Historical Provider, MD  HYDROcodone-acetaminophen (NORCO) 10-325 MG per tablet Take 1 tablet by mouth every 6 (six) hours as needed for severe pain.   Yes Historical Provider, MD  isosorbide mononitrate (IMDUR) 30 MG 24 hr tablet Take 15 mg by mouth every morning.    Yes Historical Provider, MD  lactulose (CHRONULAC) 10 GM/15ML solution Take 20 g by mouth 2 (two) times daily as needed for mild constipation.   Yes Historical Provider, MD  lamiVUDine (EPIVIR) 150 MG tablet Take 150 mg by mouth daily.   Yes Historical Provider, MD  latanoprost (XALATAN) 0.005 % ophthalmic solution INSTILL ONE DROP TO BOTH EYES EVERY NIGHT 04/20/15  Yes Historical Provider, MD  Multiple Vitamins-Iron (MULTIVITAMINS WITH IRON) TABS tablet Take 1 tablet by mouth daily.   Yes Historical Provider, MD  nitroGLYCERIN (NITROSTAT) 0.4 MG SL tablet Place 0.4 mg under the tongue every 5 (five) minutes as needed for chest pain.   Yes Historical Provider, MD  ondansetron (ZOFRAN) 8 MG tablet Take 8 mg by mouth every 8 (eight) hours as needed for nausea or vomiting.   Yes Historical Provider, MD  sodium bicarbonate 650 MG tablet Take 1,300 mg by mouth 2 (two) times daily.    Yes Historical Provider, MD  sodium polystyrene (KAYEXALATE) 15 GM/60ML suspension Take 15 g by mouth 3 (three) times a week.   Yes Historical Provider, MD  warfarin (COUMADIN) 5 MG tablet Take 5 mg by mouth every other day.   Yes Historical Provider, MD  warfarin (COUMADIN) 7.5 MG tablet Take 7.5 mg by mouth every other day. Alternating with the warfarin 5 mg.   Yes Historical Provider, MD     VITAL SIGNS:  Blood pressure (!) 160/68, pulse 81, temperature 98.4 F (36.9 C), temperature source Oral, resp. rate 18, height 6\' 1"  (1.854 m), weight 75.8 kg (167 lb), SpO2 100 %.  PHYSICAL EXAMINATION:   Physical Exam  GENERAL:  75 y.o.-year-old patient lying in the bed with no acute distress.  EYES: Pupils equal, round, reactive to light and accommodation. No scleral icterus. Extraocular muscles intact.  HEENT: Head atraumatic, normocephalic. Oropharynx and nasopharynx clear. No oropharyngeal erythema, moist oral mucosa  NECK:  Supple, no jugular venous distention. No thyroid enlargement, no tenderness.  LUNGS: Normal breath sounds bilaterally, no wheezing, rales, rhonchi. No use of accessory muscles of respiration.  CARDIOVASCULAR: S1, S2 normal. No murmurs, rubs, or gallops.  ABDOMEN: Soft, nontender, nondistended. Bowel sounds present. No organomegaly or mass.  EXTREMITIES: No pedal edema, cyanosis, or clubbing. + 2 pedal & radial pulses b/l.   NEUROLOGIC: Cranial nerves II through XII are intact. No focal Motor or sensory deficits appreciated b/l PSYCHIATRIC: The patient is alert and oriented x 3. Good affect.  SKIN:  No obvious rash, lesion, or ulcer.   LABORATORY PANEL:   CBC  Recent Labs Lab 05/23/16 1023  WBC 13.4*  HGB 11.3*  HCT 32.5*  PLT 148*   ------------------------------------------------------------------------------------------------------------------  Chemistries   Recent Labs Lab 05/23/16 1023  NA 133*  K 5.5*  CL 96*  CO2 28  GLUCOSE 177*  BUN 40*  CREATININE 3.19*  CALCIUM 8.5*  AST 17  ALT 11*  ALKPHOS 89  BILITOT 0.7   ------------------------------------------------------------------------------------------------------------------  Cardiac Enzymes  Recent Labs Lab 05/23/16 1023  TROPONINI 0.03*   ------------------------------------------------------------------------------------------------------------------  RADIOLOGY:  Dg Chest 2 View  Result Date: 05/23/2016 CLINICAL DATA:  Shortness of Breath EXAM: CHEST  2 VIEW COMPARISON:  October 31, 2015 FINDINGS: There is no edema or consolidation. Heart is upper normal in size with  pulmonary vascularity within normal limits. Implantable electrophysiologic device lead is in the anterior chest wall on the left, stable. No adenopathy. No pneumothorax. There is aortic atherosclerosis in the arch region. No bone lesions. IMPRESSION: No edema or consolidation. Electrophysiologic device lead position unchanged. Aortic atherosclerosis. Electronically Signed   By: Lowella Grip III M.D.   On: 05/23/2016 10:55     IMPRESSION AND PLAN:   * Acute kidney injury over chronic kidney disease stage IV with hyperkalemia Likely due to dehydration being on high-dose Lasix. He has had problems with hyperkalemia in the past. We will hold Lasix and start IV fluids. Repeat labs in the morning Patient's weakness is likely due to this. 1 dose of Lasix. Patient does not want to take Veltessa.  * Leukocytosis Mild. Check blood cultures. No antibiotics. Could be hemoconcentration. Repeat labs in the morning.  * Hypertension Continue home medications  * Diabetes mellitus type 2 Continue home medications along with sliding scale insulin. Accu-Cheks before meals and at bedtime.  * HIV Patient currently on treatment and follows with infectious disease at [facility].  * DVT prophylaxis. Patient is on warfarin.  All the records are reviewed and case discussed with ED provider. Management plans discussed with the patient, family and they are in agreement.  CODE STATUS: FULL CODE  TOTAL TIME TAKING CARE OF THIS PATIENT: 45 minutes.   Hillary Bow R M.D on 05/23/2016 at 2:03 PM  Between 7am to 6pm - Pager - [phone]  After 6pm go to [email or web address] - password [email or web address] [facility]  Office  [phone]  CC: Primary care physician; Cletis Athens, MD  Note: This dictation was prepared with Dragon dictation along with smaller phrase technology. Any transcriptional errors that result from this process are unintentional.

## 2016-05-23 NOTE — ED Provider Notes (Signed)
[facility] Emergency Department Provider Note  Time seen: 11:16 AM  I have reviewed the triage vital signs and the nursing notes.   HISTORY  Chief Complaint Shortness of Breath    HPI Robert Bentley is a 75 y.o. male with a past medical history of atrial fibrillation, CHF, diabetes, HIV, hepatitis C, CK D, who presents the emergency department with generalized weakness. According to the patientfor the past 2 days he has been feeling extremely fatigued and weak. He states approximately one year ago he was diagnosed with urinary tract infection with similar symptoms at that time. States low-grade subjective fever at home for the last day, denies any cough, congestion, chest pain, abdominal pain, black or bloody stool. Denies dysuria. Patient states he does have a history of anemia, but denies any history of GI bleed.  Past Medical History:  Diagnosis Date  . Anxiety   . Atrial fibrillation ([place])   . CHF (congestive heart failure) ([place])   . Coronary artery disease   . Diabetes mellitus without complication ([place])   . Hepatitis C   . HIV disease ([place])   . Hypertension   . PTSD (post-traumatic stress disorder)   . Renal failure     Patient Active Problem List   Diagnosis Date Noted  . Anemia in chronic renal disease 05/03/2015  . UTI (urinary tract infection) 04/12/2015  . Subtherapeutic international normalized ratio (INR) 04/12/2015  . Infection of urinary tract 04/12/2015  . Chest pain 04/10/2015  . Acute kidney injury ([place]) 04/10/2015    Past Surgical History:  Procedure Laterality Date  . APPENDECTOMY    . cardiac stenting    . CHOLECYSTECTOMY    . EP IMPLANTABLE DEVICE    . FRACTURE SURGERY      Prior to Admission medications   Medication Sig Start Date End Date Taking? Authorizing Provider  ALPRAZolam Duanne Moron) 0.5 MG tablet Take 0.5 mg by mouth 2 (two) times daily as needed for anxiety.    Historical Provider, MD  amiodarone (PACERONE) 400  MG tablet Take 200 mg by mouth daily.    Historical Provider, MD  bisacodyl (DULCOLAX) 5 MG EC tablet Take 15 mg by mouth daily as needed for moderate constipation.    Historical Provider, MD  carvedilol (COREG) 6.25 MG tablet Take 6.25 mg by mouth 2 (two) times daily.    Historical Provider, MD  Cholecalciferol (VITAMIN D3) 2000 UNITS TABS Take 1 tablet by mouth daily.    Historical Provider, MD  clopidogrel (PLAVIX) 75 MG tablet Take 75 mg by mouth daily.    Historical Provider, MD  docusate sodium (COLACE) 50 MG capsule Take 50 mg by mouth 2 (two) times daily.    Historical Provider, MD  dolutegravir (TIVICAY) 50 MG tablet Take 50 mg by mouth daily.    Historical Provider, MD  furosemide (LASIX) 80 MG tablet Take 80 mg by mouth 2 (two) times daily.    Historical Provider, MD  glimepiride (AMARYL) 2 MG tablet Take 2 mg by mouth daily as needed (for high blood sugar).    Historical Provider, MD  HYDROcodone-acetaminophen (NORCO) 10-325 MG per tablet Take 1 tablet by mouth every 6 (six) hours as needed for severe pain.    Historical Provider, MD  isosorbide mononitrate (IMDUR) 30 MG 24 hr tablet Take 30 mg by mouth daily.    Historical Provider, MD  lactulose (CHRONULAC) 10 GM/15ML solution Take 20 g by mouth 2 (two) times daily as needed for mild constipation.  Historical Provider, MD  lamiVUDine (EPIVIR) 150 MG tablet Take 150 mg by mouth daily.    Historical Provider, MD  latanoprost (XALATAN) 0.005 % ophthalmic solution INSTILL ONE DROP TO BOTH EYES EVERY NIGHT 04/20/15   Historical Provider, MD  Multiple Vitamins-Iron (MULTIVITAMINS WITH IRON) TABS tablet Take 1 tablet by mouth daily.    Historical Provider, MD  nitroGLYCERIN (NITROSTAT) 0.4 MG SL tablet Place 0.4 mg under the tongue every 5 (five) minutes as needed for chest pain.    Historical Provider, MD  ondansetron (ZOFRAN) 8 MG tablet Take 8 mg by mouth every 8 (eight) hours as needed for nausea or vomiting.    Historical Provider, MD   sodium bicarbonate 650 MG tablet Take 1,300 mg by mouth 2 (two) times daily.     Historical Provider, MD  sodium polystyrene (KAYEXALATE) 15 GM/60ML suspension Take 15 g by mouth 3 (three) times a week.    Historical Provider, MD  warfarin (COUMADIN) 7.5 MG tablet Take 7.5 mg by mouth at bedtime.    Historical Provider, MD  zidovudine (RETROVIR) 300 MG tablet Take 300 mg by mouth 2 (two) times daily.    Historical Provider, MD    Allergies  Allergen Reactions  . Morphine Nausea And Vomiting, Rash and Other (See Comments)  . Efavirenz Other (See Comments)    Reaction:  Unknown   . Statins Other (See Comments)    Reaction:  Unknown   . Sulfamethizole Other (See Comments)    fever Reaction:  Unknown   . Morphine And Related Nausea And Vomiting and Rash    Family History  Problem Relation Age of Onset  . Heart failure Mother     Social History Social History  Substance Use Topics  . Smoking status: Never Smoker  . Smokeless tobacco: Never Used  . Alcohol use No    Review of Systems Constitutional: Negative for fever. Cardiovascular: Negative for chest pain. Respiratory: Negative for shortness of breath. Gastrointestinal: Mild nausea. Negative for abdominal pain, vomiting. Genitourinary: Negative for dysuria. Musculoskeletal: Negative for back pain. Neurological: Negative for headaches, focal weakness 10-point ROS otherwise negative.  ____________________________________________   PHYSICAL EXAM:  VITAL SIGNS: ED Triage Vitals [05/23/16 1008]  Enc Vitals Group     BP (!) 160/68     Pulse Rate 81     Resp 18     Temp 98.4 F (36.9 C)     Temp Source Oral     SpO2 100 %     Weight 167 lb (75.8 kg)     Height 6\' 1"  (1.854 m)     Head Circumference      Peak Flow      Pain Score 8     Pain Loc      Pain Edu?      Excl. in [place]?     Constitutional: Alert and oriented. Well appearing and in no distress. Eyes: Normal exam ENT   Head: Normocephalic and  atraumatic.   Mouth/Throat: Mucous membranes are moist. Cardiovascular: Normal rate, regular rhythm. No murmur Respiratory: Normal respiratory effort without tachypnea nor retractions. Breath sounds are clear Gastrointestinal: Soft and nontender. No distention.   Musculoskeletal: Nontender with normal range of motion in all extremities.  Neurologic:  Normal speech and language. No gross focal neurologic deficits are appreciated. Skin:  Skin is warm, dry and intact.  Psychiatric: Mood and affect are normal. Speech and behavior are normal.   ____________________________________________    EKG  EKG reviewed and interpreted  by myself shows normal sinus rhythm at 77 bpm, widened QRS, normal axis, left bundle branch block without concerning ST change. Similar appearing EKG 04/10/15.  ____________________________________________    RADIOLOGY  Chest x-ray shows no acute abnormality  ____________________________________________   INITIAL IMPRESSION / ASSESSMENT AND PLAN / ED COURSE  Pertinent labs & imaging results that were available during my care of the patient were reviewed by me and considered in my medical decision making (see chart for details).  The patient presents the emergency department with 2-3 days of generalized fatigue, generalized weakness. Denies any focal deficits. Patient has an overall normal exam of the skin does appear somewhat pale. We will check labs, chest x-ray, urinalysis, troponin, EKG and closely monitor in the emergency department. Overall the patient appears well, is able to stand and ambulate without issue.  Chest x-ray shows no acute abnormality. Urinalysis within normal limits. EKG unchanged. Patient's labs have resulted showing acute on chronic renal insufficiency with an elevated potassium of 5.5. We'll IV hydrate, and admitted to the hospital for further treatment and evaluation. ____________________________________________   FINAL CLINICAL  IMPRESSION(S) / ED DIAGNOSES  Generalized weakness  Hyperkalemia Acute on chronic renal insufficiency   Harvest Dark, MD 05/23/16 1253

## 2016-05-23 NOTE — ED Triage Notes (Signed)
Pt c/o increased SOB in the past 2 days  And is concerned about pnemonia due to HIV status and is on 2L [place] continuous.. Pt denies any chest pain with the SOB.the patient does have a defibrillator.

## 2016-05-24 DIAGNOSIS — I10 Essential (primary) hypertension: Secondary | ICD-10-CM | POA: Diagnosis not present

## 2016-05-24 DIAGNOSIS — R531 Weakness: Secondary | ICD-10-CM | POA: Diagnosis not present

## 2016-05-24 DIAGNOSIS — N179 Acute kidney failure, unspecified: Secondary | ICD-10-CM | POA: Diagnosis not present

## 2016-05-24 DIAGNOSIS — E875 Hyperkalemia: Secondary | ICD-10-CM | POA: Diagnosis not present

## 2016-05-24 LAB — URINE CULTURE: CULTURE: NO GROWTH

## 2016-05-24 LAB — BASIC METABOLIC PANEL
ANION GAP: 6 (ref 5–15)
BUN: 37 mg/dL — ABNORMAL HIGH (ref 6–20)
CHLORIDE: 102 mmol/L (ref 101–111)
CO2: 28 mmol/L (ref 22–32)
Calcium: 8.1 mg/dL — ABNORMAL LOW (ref 8.9–10.3)
Creatinine, Ser: 2.91 mg/dL — ABNORMAL HIGH (ref 0.61–1.24)
GFR calc non Af Amer: 20 mL/min — ABNORMAL LOW (ref >60)
GFR, EST AFRICAN AMERICAN: 23 mL/min — AB (ref >60)
GLUCOSE: 100 mg/dL — AB (ref 65–99)
Potassium: 4.6 mmol/L (ref 3.5–5.1)
Sodium: 136 mmol/L (ref 135–145)

## 2016-05-24 LAB — CBC
HCT: 27.8 % — ABNORMAL LOW (ref 40.0–52.0)
HEMOGLOBIN: 9.8 g/dL — AB (ref 13.0–18.0)
MCH: 36.8 pg — AB (ref 26.0–34.0)
MCHC: 35.1 g/dL (ref 32.0–36.0)
MCV: 104.8 fL — AB (ref 80.0–100.0)
Platelets: 125 10*3/uL — ABNORMAL LOW (ref 150–440)
RBC: 2.66 MIL/uL — AB (ref 4.40–5.90)
RDW: 14.8 % — ABNORMAL HIGH (ref 11.5–14.5)
WBC: 9.8 10*3/uL (ref 3.8–10.6)

## 2016-05-24 LAB — GLUCOSE, CAPILLARY
Glucose-Capillary: 143 mg/dL — ABNORMAL HIGH (ref 65–99)
Glucose-Capillary: 118 mg/dL — ABNORMAL HIGH (ref 65–99)

## 2016-05-24 MED ORDER — DOXYCYCLINE HYCLATE 100 MG PO TABS: 100.0000 mg | ORAL_TABLET | Freq: Two times a day (BID) | ORAL | 0 refills | Status: DC

## 2016-05-24 MED ORDER — DOXYCYCLINE HYCLATE 100 MG PO TABS: 100.0000 mg | ORAL_TABLET | Freq: Two times a day (BID) | ORAL | Status: DC

## 2016-05-24 NOTE — Discharge Summary (Signed)
[facility] [place] NAME: Robert Bentley    MR#:  [id number]  DATE OF BIRTH:  [birth year]  DATE OF ADMISSION:  05/23/2016 ADMITTING PHYSICIAN: Hillary Bow, MD  DATE OF DISCHARGE: 05/24/2016  PRIMARY CARE PHYSICIAN: MASOUD,JAVED, MD    ADMISSION DIAGNOSIS:  Hyperkalemia [E87.5] General weakness [R53.1] Acute on chronic renal failure ([place]) [N17.9, N18.9]  DISCHARGE DIAGNOSIS:  Active Problems:   Weakness   SECONDARY DIAGNOSIS:   Past Medical History:  Diagnosis Date  . Anxiety   . Atrial fibrillation ([place])   . CHF (congestive heart failure) ([place])   . Coronary artery disease   . Diabetes mellitus without complication ([place])   . Hepatitis C   . HIV disease ([place])   . Hypertension   . PTSD (post-traumatic stress disorder)   . Renal failure     HOSPITAL COURSE:   1. Acute kidney injury on chronic kidney disease stage III. Creatinine when he came in was 3.19 and was down to 2.91 upon discharge. Patient stated he felt well enough to go home. Continue to hold Lasix for another 2 days. 2. Hyperkalemia. Potassium 5.5 on admission down to 4.6 upon discharge. 3. Recent sinus infection. Blood cultures here are negative to date. Urine analysis negative. Gave a course of doxycycline upon going home. Advised that this can cause photosensitivity. Patient did not want to stay in the hospital any further. Unclear what I'm treating at this point. 4. Atrial fibrillation on Coumadin. Patient will be taking 7.5 mg daily for the next 3 days and then go back to his alternating dose that he takes at home. 5. Anxiety continue psychiatric medications 6. HIV. Follow-up as outpatient. Continue usual medications 7. Type 2 diabetes mellitus continue outpatient medications 8. Essential hypertension changes in medication DISCHARGE CONDITIONS:   Satisfactory  CONSULTS OBTAINED:  None   DRUG ALLERGIES:   Allergies  Allergen Reactions  . Morphine Nausea  And Vomiting, Rash and Other (See Comments)    Reactions: "gets violent"  . Efavirenz Other (See Comments)    Reaction:  Unknown   . Statins Hives  . Sulfamethizole Other (See Comments)    fever   . Morphine And Related Nausea And Vomiting and Rash    DISCHARGE MEDICATIONS:   Current Discharge Medication List    START taking these medications   Details  doxycycline (VIBRA-TABS) 100 MG tablet Take 1 tablet (100 mg total) by mouth every 12 (twelve) hours. Qty: 20 tablet, Refills: 0      CONTINUE these medications which have NOT CHANGED   Details  ALPRAZolam (XANAX) 0.5 MG tablet Take 0.5 mg by mouth 2 (two) times daily as needed for anxiety.    bisacodyl (DULCOLAX) 5 MG EC tablet Take 15 mg by mouth daily as needed for moderate constipation.    carvedilol (COREG) 6.25 MG tablet Take 6.25 mg by mouth 2 (two) times daily.    Cholecalciferol (VITAMIN D3) 2000 UNITS TABS Take 1 tablet by mouth daily.    clopidogrel (PLAVIX) 75 MG tablet Take 75 mg by mouth daily.    dolutegravir (TIVICAY) 50 MG tablet Take 50 mg by mouth daily.    glimepiride (AMARYL) 2 MG tablet Take 2 mg by mouth daily as needed (for high blood sugar).    HYDROcodone-acetaminophen (NORCO) 10-325 MG per tablet Take 1 tablet by mouth every 6 (six) hours as needed for severe pain.    isosorbide mononitrate (IMDUR) 30 MG 24 hr tablet Take 15 mg  by mouth every morning.     lactulose (CHRONULAC) 10 GM/15ML solution Take 20 g by mouth 2 (two) times daily as needed for mild constipation.    lamiVUDine (EPIVIR) 150 MG tablet Take 150 mg by mouth daily.    latanoprost (XALATAN) 0.005 % ophthalmic solution INSTILL ONE DROP TO BOTH EYES EVERY NIGHT Refills: 6    Multiple Vitamins-Iron (MULTIVITAMINS WITH IRON) TABS tablet Take 1 tablet by mouth daily.    nitroGLYCERIN (NITROSTAT) 0.4 MG SL tablet Place 0.4 mg under the tongue every 5 (five) minutes as needed for chest pain.    ondansetron (ZOFRAN) 8 MG tablet Take  8 mg by mouth every 8 (eight) hours as needed for nausea or vomiting.    sodium bicarbonate 650 MG tablet Take 1,300 mg by mouth 2 (two) times daily.     sodium polystyrene (KAYEXALATE) 15 GM/60ML suspension Take 15 g by mouth 3 (three) times a week.    !! warfarin (COUMADIN) 5 MG tablet Take 5 mg by mouth every other day.    !! warfarin (COUMADIN) 7.5 MG tablet Take 7.5 mg by mouth every other day. Alternating with the warfarin 5 mg.     !! - Potential duplicate medications found. Please discuss with provider.    STOP taking these medications     furosemide (LASIX) 80 MG tablet          DISCHARGE INSTRUCTIONS:   Follow-up PMD one week  If you experience worsening of your admission symptoms, develop shortness of breath, life threatening emergency, suicidal or homicidal thoughts you must seek medical attention immediately by calling 911 or calling your MD immediately  if symptoms less severe.  You Must read complete instructions/literature along with all the possible adverse reactions/side effects for all the Medicines you take and that have been prescribed to you. Take any new Medicines after you have completely understood and accept all the possible adverse reactions/side effects.   Please note  You were cared for by a hospitalist during your hospital stay. If you have any questions about your discharge medications or the care you received while you were in the hospital after you are discharged, you can call the unit and asked to speak with the hospitalist on call if the hospitalist that took care of you is not available. Once you are discharged, your primary care physician will handle any further medical issues. Please note that NO REFILLS for any discharge medications will be authorized once you are discharged, as it is imperative that you return to your primary care physician (or establish a relationship with a primary care physician if you do not have one) for your aftercare needs  so that they can reassess your need for medications and monitor your lab values.    Today   CHIEF COMPLAINT:   Chief Complaint  Patient presents with  . Shortness of Breath    HISTORY OF PRESENT ILLNESS:  Robert Bentley  is a 75 y.o. male Presented with shortness of breath. Found to be in acute kidney injury with hyperkalemia.   VITAL SIGNS:  Blood pressure (!) 156/73, pulse 73, temperature 99.1 F (37.3 C), temperature source Oral, resp. rate (!) 24, height 6\' 1"  (1.854 m), weight 75.8 kg (167 lb), SpO2 97 %.  Respiration 16 at discharge not 24.   PHYSICAL EXAMINATION:  GENERAL:  75 y.o.-year-old patient lying in the bed with no acute distress.  EYES: Pupils equal, round, reactive to light and accommodation. No scleral icterus. Extraocular muscles intact.  HEENT: Head atraumatic, normocephalic. Oropharynx and nasopharynx clear.  NECK:  Supple, no jugular venous distention. No thyroid enlargement, no tenderness.  LUNGS: Normal breath sounds bilaterally, no wheezing, rales,rhonchi or crepitation. No use of accessory muscles of respiration.  CARDIOVASCULAR: S1, S2 normal. No murmurs, rubs, or gallops.  ABDOMEN: Soft, non-tender, non-distended. Bowel sounds present. No organomegaly or mass.  EXTREMITIES:  2+edema,  nocyanosis, or clubbing.  NEUROLOGIC: Cranial nerves II through XII are intact. Muscle strength 5/5 in all extremities. Sensation intact. Gait not checked.  PSYCHIATRIC: The patient is alert and oriented x 3.  SKIN:  Chronic lower extremity discoloration. Dry blood around his toes.  DATA REVIEW:   CBC  Recent Labs Lab 05/24/16 0543  WBC 9.8  HGB 9.8*  HCT 27.8*  PLT 125*    Chemistries   Recent Labs Lab 05/23/16 1023 05/24/16 0543  NA 133* 136  K 5.5* 4.6  CL 96* 102  CO2 28 28  GLUCOSE 177* 100*  BUN 40* 37*  CREATININE 3.19* 2.91*  CALCIUM 8.5* 8.1*  AST 17  --   ALT 11*  --   ALKPHOS 89  --   BILITOT 0.7  --     Cardiac Enzymes  Recent  Labs Lab 05/23/16 1023  TROPONINI 0.03*    Microbiology Results  Results for orders placed or performed during the hospital encounter of 05/23/16  CULTURE, BLOOD (ROUTINE X 2) w Reflex to ID Panel     Status: None (Preliminary result)   Collection Time: 05/23/16  2:28 PM  Result Value Ref Range Status   Specimen Description BLOOD RIGHT ANTECUBITAL  Final   Special Requests BOTTLES DRAWN AEROBIC AND ANAEROBIC 4CC  Final   Culture NO GROWTH < 24 HOURS  Final   Report Status PENDING  Incomplete  CULTURE, BLOOD (ROUTINE X 2) w Reflex to ID Panel     Status: None (Preliminary result)   Collection Time: 05/23/16  2:28 PM  Result Value Ref Range Status   Specimen Description BLOOD LEFT FOREARM  Final   Special Requests BOTTLES DRAWN AEROBIC AND ANAEROBIC 3CC  Final   Culture NO GROWTH < 24 HOURS  Final   Report Status PENDING  Incomplete    RADIOLOGY:  Dg Chest 2 View  Result Date: 05/23/2016 CLINICAL DATA:  Shortness of Breath EXAM: CHEST  2 VIEW COMPARISON:  October 31, 2015 FINDINGS: There is no edema or consolidation. Heart is upper normal in size with pulmonary vascularity within normal limits. Implantable electrophysiologic device lead is in the anterior chest wall on the left, stable. No adenopathy. No pneumothorax. There is aortic atherosclerosis in the arch region. No bone lesions. IMPRESSION: No edema or consolidation. Electrophysiologic device lead position unchanged. Aortic atherosclerosis. Electronically Signed   By: Lowella Grip III M.D.   On: 05/23/2016 10:55    Management plans discussed with the patient, family and they are in agreement.  CODE STATUS:     Code Status Orders        Start     Ordered   05/23/16 1321  Full code  Continuous     05/23/16 1321    Code Status History    Date Active Date Inactive Code Status Order ID Comments User Context   04/10/2015 10:16 PM 04/12/2015  1:31 PM Full Code [id number]  Lytle Butte, MD ED    Advance Directive  Documentation   Flowsheet Row Most Recent Value  Type of Advance Directive  Healthcare Power of [place], [place] will  Pre-existing out of facility DNR order (yellow form or pink MOST form)  No data  "MOST" Form in Place?  No data      TOTAL TIME TAKING CARE OF THIS PATIENT: 35 minutes.    Loletha Grayer M.D on 05/24/2016 at 1:45 PM  Between 7am to 6pm - Pager - [phone]  After 6pm go to [email or web address] - password [email or web address]  [facility] Office  [phone]  CC: Primary care physician; Cletis Athens, MD

## 2016-05-24 NOTE — Discharge Instructions (Signed)
Acute Kidney Injury °Acute kidney injury is any condition in which there is sudden (acute) damage to the kidneys. Acute kidney injury was previously known as acute kidney failure or acute renal failure. The kidneys are two organs that lie on either side of the spine between the middle of the back and the front of the abdomen. The kidneys: °· Remove wastes and extra water from the blood.   °· Produce important hormones. These help keep bones strong, regulate blood pressure, and help create red blood cells.   °· Balance the fluids and chemicals in the blood and tissues. °A small amount of kidney damage may not cause problems, but a large amount of damage may make it difficult or impossible for the kidneys to work the way they should. Acute kidney injury may develop into long-lasting (chronic) kidney disease. It may also develop into a life-threatening disease called end-stage kidney disease. Acute kidney injury can get worse very quickly, so it should be treated right away. Early treatment may prevent other kidney diseases from developing. °CAUSES  °· A problem with blood flow to the kidneys. This may be caused by:   °¨ Blood loss.   °¨ Heart disease.   °¨ Severe burns.   °¨ Liver disease. °· Direct damage to the kidneys. This may be caused by: °¨ Some medicines.   °¨ A kidney infection.   °¨ Poisoning or consuming toxic substances.   °¨ A surgical wound.   °¨ A blow to the kidney area.   °· A problem with urine flow. This may be caused by:   °¨ Cancer.   °¨ Kidney stones.   °¨ An enlarged prostate. °SIGNS AND SYMPTOMS  °· Swelling (edema) of the legs, ankles, or feet.   °· Tiredness (lethargy).   °· Nausea or vomiting.   °· Confusion.   °· Problems with urination, such as:   °¨ Painful or burning feeling during urination.   °¨ Decreased urine production.   °¨ Frequent accidents in children who are potty trained.   °¨ Bloody urine.   °· Muscle twitches and cramps.   °· Shortness of breath.   °· Seizures.   °· Chest  pain or pressure. °Sometimes, no symptoms are present.  °DIAGNOSIS °Acute kidney injury may be detected and diagnosed by tests, including blood, urine, imaging, or kidney biopsy tests.  °TREATMENT °Treatment of acute kidney injury varies depending on the cause and severity of the kidney damage. In mild cases, no treatment may be needed. The kidneys may heal on their own. If acute kidney injury is more severe, your health care provider will treat the cause of the kidney damage, help the kidneys heal, and prevent complications from occurring. Severe cases may require a procedure to remove toxic wastes from the body (dialysis) or surgery to repair kidney damage. Surgery may involve:  °· Repair of a torn kidney.   °· Removal of an obstruction. °HOME CARE INSTRUCTIONS °· Follow your prescribed diet. °· Take medicines only as directed by your health care provider.  °· Do not take any new medicines (prescription, over-the-counter, or nutritional supplements) unless approved by your health care provider. Many medicines can worsen your kidney damage or may need to have the dose adjusted.   °· Keep all follow-up visits as directed by your health care provider. This is important. °· Observe your condition to make sure you are healing as expected. °SEEK IMMEDIATE MEDICAL CARE IF: °· You are feeling ill or have severe pain in the back or side.   °· Your symptoms return or you have new symptoms. °· You have any symptoms of end-stage kidney disease. These include:   °¨ Persistent itchiness.   °¨   Loss of appetite.   Headaches.   Abnormally dark or light skin.  Numbness in the hands or feet.   Easy bruising.   Frequent hiccups.   Menstruation stops.   You have a fever.  You have increased urine production.  You have pain or bleeding when urinating. MAKE SURE YOU:   Understand these instructions.  Will watch your condition.  Will get help right away if you are not doing well or get worse.   This  information is not intended to replace advice given to you by your health care provider. Make sure you discuss any questions you have with your health care provider.   Document Released: 04/21/2011 Document Revised: 10/27/2014 Document Reviewed: 06/04/2012 Elsevier Interactive Patient Education 2016 [employer].   Follow all MD discharge instructions. Take all medications as prescribed. Keep all follow up appointments. If your symptoms return, call your doctor. If you experience any new symptoms that are of concern to you or that are bothersome to you, call your doctor. For all questions and/or concerns, call your doctor.   If you have a medical emergency, call 911

## 2016-05-24 NOTE — Progress Notes (Signed)
Pt refused to have pedal pulses assessed.States feet and legs get infected when people touch them.

## 2016-05-24 NOTE — Progress Notes (Signed)
Pt d/c home; d/c instructions reviewed w/ pt; pt understanding was verbalized; IV removed catheter in tact, gauze dressing applied; all pt questions answered; pt left unit via wheelchair accompanied by staff 

## 2016-05-28 LAB — CULTURE, BLOOD (ROUTINE X 2)
CULTURE: NO GROWTH
Culture: NO GROWTH

## 2016-05-29 DIAGNOSIS — N2581 Secondary hyperparathyroidism of renal origin: Secondary | ICD-10-CM | POA: Diagnosis not present

## 2016-05-29 DIAGNOSIS — I10 Essential (primary) hypertension: Secondary | ICD-10-CM | POA: Diagnosis not present

## 2016-05-29 DIAGNOSIS — N183 Chronic kidney disease, stage 3 (moderate): Secondary | ICD-10-CM | POA: Diagnosis not present

## 2016-05-29 DIAGNOSIS — N184 Chronic kidney disease, stage 4 (severe): Secondary | ICD-10-CM | POA: Diagnosis not present

## 2016-05-29 DIAGNOSIS — E8881 Metabolic syndrome: Secondary | ICD-10-CM | POA: Diagnosis not present

## 2016-05-29 DIAGNOSIS — I4891 Unspecified atrial fibrillation: Secondary | ICD-10-CM | POA: Diagnosis not present

## 2016-05-29 DIAGNOSIS — E1122 Type 2 diabetes mellitus with diabetic chronic kidney disease: Secondary | ICD-10-CM | POA: Diagnosis not present

## 2016-05-29 DIAGNOSIS — D631 Anemia in chronic kidney disease: Secondary | ICD-10-CM | POA: Diagnosis not present

## 2016-05-29 DIAGNOSIS — I509 Heart failure, unspecified: Secondary | ICD-10-CM | POA: Diagnosis not present

## 2016-06-03 DIAGNOSIS — I4891 Unspecified atrial fibrillation: Secondary | ICD-10-CM | POA: Diagnosis not present

## 2016-06-03 DIAGNOSIS — Z0283 Encounter for blood-alcohol and blood-drug test: Secondary | ICD-10-CM | POA: Diagnosis not present

## 2016-06-04 DIAGNOSIS — I82531 Chronic embolism and thrombosis of right popliteal vein: Secondary | ICD-10-CM | POA: Diagnosis not present

## 2016-06-04 DIAGNOSIS — E1122 Type 2 diabetes mellitus with diabetic chronic kidney disease: Secondary | ICD-10-CM | POA: Diagnosis not present

## 2016-06-04 DIAGNOSIS — Z21 Asymptomatic human immunodeficiency virus [HIV] infection status: Secondary | ICD-10-CM | POA: Diagnosis not present

## 2016-06-04 DIAGNOSIS — L03116 Cellulitis of left lower limb: Secondary | ICD-10-CM | POA: Diagnosis not present

## 2016-06-04 DIAGNOSIS — Z79899 Other long term (current) drug therapy: Secondary | ICD-10-CM | POA: Diagnosis not present

## 2016-06-04 DIAGNOSIS — I447 Left bundle-branch block, unspecified: Secondary | ICD-10-CM | POA: Diagnosis not present

## 2016-06-04 DIAGNOSIS — R600 Localized edema: Secondary | ICD-10-CM | POA: Diagnosis not present

## 2016-06-04 DIAGNOSIS — Z7902 Long term (current) use of antithrombotics/antiplatelets: Secondary | ICD-10-CM | POA: Diagnosis not present

## 2016-06-04 DIAGNOSIS — Z8673 Personal history of transient ischemic attack (TIA), and cerebral infarction without residual deficits: Secondary | ICD-10-CM | POA: Diagnosis not present

## 2016-06-04 DIAGNOSIS — R2242 Localized swelling, mass and lump, left lower limb: Secondary | ICD-10-CM | POA: Diagnosis not present

## 2016-06-04 DIAGNOSIS — I1310 Hypertensive heart and chronic kidney disease without heart failure, with stage 1 through stage 4 chronic kidney disease, or unspecified chronic kidney disease: Secondary | ICD-10-CM | POA: Diagnosis not present

## 2016-06-04 DIAGNOSIS — R079 Chest pain, unspecified: Secondary | ICD-10-CM | POA: Diagnosis not present

## 2016-06-04 DIAGNOSIS — Z86718 Personal history of other venous thrombosis and embolism: Secondary | ICD-10-CM | POA: Diagnosis not present

## 2016-06-04 DIAGNOSIS — I82511 Chronic embolism and thrombosis of right femoral vein: Secondary | ICD-10-CM | POA: Diagnosis not present

## 2016-06-04 DIAGNOSIS — Z9889 Other specified postprocedural states: Secondary | ICD-10-CM | POA: Diagnosis not present

## 2016-06-04 DIAGNOSIS — I4439 Other atrioventricular block: Secondary | ICD-10-CM | POA: Diagnosis not present

## 2016-06-04 DIAGNOSIS — I70202 Unspecified atherosclerosis of native arteries of extremities, left leg: Secondary | ICD-10-CM | POA: Diagnosis not present

## 2016-06-04 DIAGNOSIS — Z7901 Long term (current) use of anticoagulants: Secondary | ICD-10-CM | POA: Diagnosis not present

## 2016-06-04 DIAGNOSIS — I4891 Unspecified atrial fibrillation: Secondary | ICD-10-CM | POA: Diagnosis not present

## 2016-06-04 DIAGNOSIS — M79672 Pain in left foot: Secondary | ICD-10-CM | POA: Diagnosis not present

## 2016-06-04 DIAGNOSIS — Z7982 Long term (current) use of aspirin: Secondary | ICD-10-CM | POA: Diagnosis not present

## 2016-06-04 DIAGNOSIS — I252 Old myocardial infarction: Secondary | ICD-10-CM | POA: Diagnosis not present

## 2016-06-04 DIAGNOSIS — N189 Chronic kidney disease, unspecified: Secondary | ICD-10-CM | POA: Diagnosis not present

## 2016-06-04 DIAGNOSIS — I4892 Unspecified atrial flutter: Secondary | ICD-10-CM | POA: Diagnosis not present

## 2016-06-05 DIAGNOSIS — L03119 Cellulitis of unspecified part of limb: Secondary | ICD-10-CM | POA: Diagnosis not present

## 2016-06-05 DIAGNOSIS — D509 Iron deficiency anemia, unspecified: Secondary | ICD-10-CM | POA: Diagnosis not present

## 2016-06-05 DIAGNOSIS — I4891 Unspecified atrial fibrillation: Secondary | ICD-10-CM | POA: Diagnosis not present

## 2016-06-05 DIAGNOSIS — I2510 Atherosclerotic heart disease of native coronary artery without angina pectoris: Secondary | ICD-10-CM | POA: Diagnosis not present

## 2016-06-12 DIAGNOSIS — N183 Chronic kidney disease, stage 3 (moderate): Secondary | ICD-10-CM | POA: Diagnosis not present

## 2016-06-12 DIAGNOSIS — B182 Chronic viral hepatitis C: Secondary | ICD-10-CM | POA: Diagnosis not present

## 2016-06-12 DIAGNOSIS — I4891 Unspecified atrial fibrillation: Secondary | ICD-10-CM | POA: Diagnosis not present

## 2016-06-12 DIAGNOSIS — E119 Type 2 diabetes mellitus without complications: Secondary | ICD-10-CM | POA: Diagnosis not present

## 2016-06-13 DIAGNOSIS — E113593 Type 2 diabetes mellitus with proliferative diabetic retinopathy without macular edema, bilateral: Secondary | ICD-10-CM | POA: Diagnosis not present

## 2016-06-18 DIAGNOSIS — I498 Other specified cardiac arrhythmias: Secondary | ICD-10-CM | POA: Diagnosis not present

## 2016-06-18 DIAGNOSIS — M792 Neuralgia and neuritis, unspecified: Secondary | ICD-10-CM | POA: Diagnosis not present

## 2016-06-18 DIAGNOSIS — I4891 Unspecified atrial fibrillation: Secondary | ICD-10-CM | POA: Diagnosis not present

## 2016-06-18 DIAGNOSIS — E119 Type 2 diabetes mellitus without complications: Secondary | ICD-10-CM | POA: Diagnosis not present

## 2016-06-18 DIAGNOSIS — R5381 Other malaise: Secondary | ICD-10-CM | POA: Diagnosis not present

## 2016-06-18 DIAGNOSIS — E860 Dehydration: Secondary | ICD-10-CM | POA: Diagnosis not present

## 2016-06-18 DIAGNOSIS — I4892 Unspecified atrial flutter: Secondary | ICD-10-CM | POA: Diagnosis not present

## 2016-06-26 DIAGNOSIS — I4891 Unspecified atrial fibrillation: Secondary | ICD-10-CM | POA: Diagnosis not present

## 2016-06-26 DIAGNOSIS — E860 Dehydration: Secondary | ICD-10-CM | POA: Diagnosis not present

## 2016-06-26 DIAGNOSIS — E8881 Metabolic syndrome: Secondary | ICD-10-CM | POA: Diagnosis not present

## 2016-06-26 DIAGNOSIS — E119 Type 2 diabetes mellitus without complications: Secondary | ICD-10-CM | POA: Diagnosis not present

## 2016-06-26 DIAGNOSIS — I517 Cardiomegaly: Secondary | ICD-10-CM | POA: Diagnosis not present

## 2016-06-26 DIAGNOSIS — I2510 Atherosclerotic heart disease of native coronary artery without angina pectoris: Secondary | ICD-10-CM | POA: Diagnosis not present

## 2016-07-14 DIAGNOSIS — K7689 Other specified diseases of liver: Secondary | ICD-10-CM | POA: Diagnosis not present

## 2016-07-14 DIAGNOSIS — I2510 Atherosclerotic heart disease of native coronary artery without angina pectoris: Secondary | ICD-10-CM | POA: Diagnosis not present

## 2016-07-14 DIAGNOSIS — E8881 Metabolic syndrome: Secondary | ICD-10-CM | POA: Diagnosis not present

## 2016-07-14 DIAGNOSIS — L03119 Cellulitis of unspecified part of limb: Secondary | ICD-10-CM | POA: Diagnosis not present

## 2016-07-22 DIAGNOSIS — N183 Chronic kidney disease, stage 3 (moderate): Secondary | ICD-10-CM | POA: Diagnosis not present

## 2016-07-22 DIAGNOSIS — I4891 Unspecified atrial fibrillation: Secondary | ICD-10-CM | POA: Diagnosis not present

## 2016-07-22 DIAGNOSIS — D509 Iron deficiency anemia, unspecified: Secondary | ICD-10-CM | POA: Diagnosis not present

## 2016-07-22 DIAGNOSIS — Z887 Allergy status to serum and vaccine status: Secondary | ICD-10-CM | POA: Diagnosis not present

## 2016-08-01 ENCOUNTER — Emergency Department
Admission: EM | Admit: 2016-08-01 | Discharge: 2016-08-01 | Disposition: A | Discharge status: Home / Self Care | Payer: Medicare Other | Attending: Emergency Medicine | Admitting: Emergency Medicine

## 2016-08-01 DIAGNOSIS — I110 Hypertensive heart disease with heart failure: Secondary | ICD-10-CM | POA: Insufficient documentation

## 2016-08-01 DIAGNOSIS — Z79899 Other long term (current) drug therapy: Secondary | ICD-10-CM | POA: Insufficient documentation

## 2016-08-01 DIAGNOSIS — Z7984 Long term (current) use of oral hypoglycemic drugs: Secondary | ICD-10-CM | POA: Diagnosis not present

## 2016-08-01 DIAGNOSIS — R42 Dizziness and giddiness: Secondary | ICD-10-CM | POA: Diagnosis not present

## 2016-08-01 DIAGNOSIS — R112 Nausea with vomiting, unspecified: Secondary | ICD-10-CM | POA: Insufficient documentation

## 2016-08-01 DIAGNOSIS — I2510 Atherosclerotic heart disease of native coronary artery without angina pectoris: Secondary | ICD-10-CM | POA: Diagnosis not present

## 2016-08-01 DIAGNOSIS — Z21 Asymptomatic human immunodeficiency virus [HIV] infection status: Secondary | ICD-10-CM | POA: Diagnosis not present

## 2016-08-01 DIAGNOSIS — E119 Type 2 diabetes mellitus without complications: Secondary | ICD-10-CM | POA: Insufficient documentation

## 2016-08-01 DIAGNOSIS — Z7901 Long term (current) use of anticoagulants: Secondary | ICD-10-CM | POA: Diagnosis not present

## 2016-08-01 DIAGNOSIS — I509 Heart failure, unspecified: Secondary | ICD-10-CM | POA: Diagnosis not present

## 2016-08-01 DIAGNOSIS — J329 Chronic sinusitis, unspecified: Secondary | ICD-10-CM

## 2016-08-01 LAB — CBC WITH DIFFERENTIAL/PLATELET
Basophils Absolute: 0 10*3/uL (ref 0–0.1)
Basophils Relative: 0 %
Eosinophils Absolute: 0.1 10*3/uL (ref 0–0.7)
Eosinophils Relative: 1 %
HEMATOCRIT: 33.6 % — AB (ref 40.0–52.0)
Hemoglobin: 11.2 g/dL — ABNORMAL LOW (ref 13.0–18.0)
LYMPHS ABS: 3.6 10*3/uL (ref 1.0–3.6)
LYMPHS PCT: 32 %
MCH: 32.3 pg (ref 26.0–34.0)
MCHC: 33.2 g/dL (ref 32.0–36.0)
MCV: 97.1 fL (ref 80.0–100.0)
MONO ABS: 0.8 10*3/uL (ref 0.2–1.0)
MONOS PCT: 7 %
NEUTROS ABS: 6.7 10*3/uL — AB (ref 1.4–6.5)
Neutrophils Relative %: 60 %
Platelets: 129 10*3/uL — ABNORMAL LOW (ref 150–440)
RBC: 3.46 MIL/uL — ABNORMAL LOW (ref 4.40–5.90)
RDW: 17.3 % — AB (ref 11.5–14.5)
WBC: 11.3 10*3/uL — ABNORMAL HIGH (ref 3.8–10.6)

## 2016-08-01 LAB — URINALYSIS COMPLETE WITH MICROSCOPIC (ARMC ONLY)
BILIRUBIN URINE: NEGATIVE
Bacteria, UA: NONE SEEN
Glucose, UA: 50 mg/dL — AB
KETONES UR: NEGATIVE mg/dL
LEUKOCYTES UA: NEGATIVE
NITRITE: NEGATIVE
PH: 6 (ref 5.0–8.0)
Protein, ur: >500 mg/dL — AB
SPECIFIC GRAVITY, URINE: 1.011 (ref 1.005–1.030)
SQUAMOUS EPITHELIAL / LPF: NONE SEEN

## 2016-08-01 LAB — BASIC METABOLIC PANEL
Anion gap: 6 (ref 5–15)
BUN: 35 mg/dL — ABNORMAL HIGH (ref 6–20)
CALCIUM: 8.9 mg/dL (ref 8.9–10.3)
CO2: 22 mmol/L (ref 22–32)
CREATININE: 2.52 mg/dL — AB (ref 0.61–1.24)
Chloride: 109 mmol/L (ref 101–111)
GFR calc Af Amer: 28 mL/min — ABNORMAL LOW (ref >60)
GFR calc non Af Amer: 24 mL/min — ABNORMAL LOW (ref >60)
GLUCOSE: 144 mg/dL — AB (ref 65–99)
Potassium: 4.9 mmol/L (ref 3.5–5.1)
Sodium: 137 mmol/L (ref 135–145)

## 2016-08-01 LAB — TROPONIN I
TROPONIN I: 0.03 ng/mL — AB (ref <0.03)
Troponin I: 0.03 ng/mL (ref <0.03)

## 2016-08-01 MED ORDER — MECLIZINE HCL 25 MG PO TABS: 25.0000 mg | ORAL_TABLET | Freq: Three times a day (TID) | ORAL | 0 refills | Status: DC | PRN

## 2016-08-01 MED ORDER — AZITHROMYCIN 250 MG PO TABS: ORAL_TABLET | ORAL | 0 refills | Status: AC

## 2016-08-01 NOTE — ED Notes (Signed)
Pt assisted to use bathroom.

## 2016-08-01 NOTE — Discharge Instructions (Signed)
Please seek medical attention for any high fevers, chest pain, shortness of breath, change in behavior, persistent vomiting, bloody stool or any other new or concerning symptoms.  

## 2016-08-01 NOTE — ED Provider Notes (Signed)
[facility] Emergency Department Provider Note    ____________________________________________   I have reviewed the triage vital signs and the nursing notes.   HISTORY  Chief Complaint Nausea and Emesis   History limited by: Not Limited   HPI Robert Bentley is a 75 y.o. male who presents to the emergency department today after an episode of nausea, emesis and dizziness. He states that the symptoms started this morning. He was planning on trying to go to his doctor's office but felt too bad. In addition patient feels like he has had sinusitis for the past couple of days. This is something that he states he has had in the past. He normally gets prescribed a Z-Pak which she states helped clear up his symptoms. He denies any fevers.   Past Medical History:  Diagnosis Date  . Anxiety   . Atrial fibrillation ([place])   . CHF (congestive heart failure) ([place])   . Coronary artery disease   . Diabetes mellitus without complication ([place])   . Hepatitis C   . HIV disease ([place])   . Hypertension   . PTSD (post-traumatic stress disorder)   . Renal failure     Patient Active Problem List   Diagnosis Date Noted  . Weakness 05/23/2016  . Anemia in chronic renal disease 05/03/2015  . UTI (urinary tract infection) 04/12/2015  . Subtherapeutic international normalized ratio (INR) 04/12/2015  . Infection of urinary tract 04/12/2015  . Chest pain 04/10/2015  . Acute kidney injury ([place]) 04/10/2015    Past Surgical History:  Procedure Laterality Date  . APPENDECTOMY    . cardiac stenting    . CHOLECYSTECTOMY    . EP IMPLANTABLE DEVICE    . FRACTURE SURGERY      Prior to Admission medications   Medication Sig Start Date End Date Taking? Authorizing Provider  ALPRAZolam Duanne Moron) 0.5 MG tablet Take 0.5 mg by mouth 2 (two) times daily as needed for anxiety.    Historical Provider, MD  bisacodyl (DULCOLAX) 5 MG EC tablet Take 15 mg by mouth daily as needed for  moderate constipation.    Historical Provider, MD  carvedilol (COREG) 6.25 MG tablet Take 6.25 mg by mouth 2 (two) times daily.    Historical Provider, MD  Cholecalciferol (VITAMIN D3) 2000 UNITS TABS Take 1 tablet by mouth daily.    Historical Provider, MD  clopidogrel (PLAVIX) 75 MG tablet Take 75 mg by mouth daily.    Historical Provider, MD  dolutegravir (TIVICAY) 50 MG tablet Take 50 mg by mouth daily.    Historical Provider, MD  doxycycline (VIBRA-TABS) 100 MG tablet Take 1 tablet (100 mg total) by mouth every 12 (twelve) hours. 05/24/16   Loletha Grayer, MD  glimepiride (AMARYL) 2 MG tablet Take 2 mg by mouth daily as needed (for high blood sugar).    Historical Provider, MD  HYDROcodone-acetaminophen (NORCO) 10-325 MG per tablet Take 1 tablet by mouth every 6 (six) hours as needed for severe pain.    Historical Provider, MD  isosorbide mononitrate (IMDUR) 30 MG 24 hr tablet Take 15 mg by mouth every morning.     Historical Provider, MD  lactulose (CHRONULAC) 10 GM/15ML solution Take 20 g by mouth 2 (two) times daily as needed for mild constipation.    Historical Provider, MD  lamiVUDine (EPIVIR) 150 MG tablet Take 150 mg by mouth daily.    Historical Provider, MD  latanoprost (XALATAN) 0.005 % ophthalmic solution INSTILL ONE DROP TO BOTH EYES EVERY NIGHT  04/20/15   Historical Provider, MD  Multiple Vitamins-Iron (MULTIVITAMINS WITH IRON) TABS tablet Take 1 tablet by mouth daily.    Historical Provider, MD  nitroGLYCERIN (NITROSTAT) 0.4 MG SL tablet Place 0.4 mg under the tongue every 5 (five) minutes as needed for chest pain.    Historical Provider, MD  ondansetron (ZOFRAN) 8 MG tablet Take 8 mg by mouth every 8 (eight) hours as needed for nausea or vomiting.    Historical Provider, MD  sodium bicarbonate 650 MG tablet Take 1,300 mg by mouth 2 (two) times daily.     Historical Provider, MD  sodium polystyrene (KAYEXALATE) 15 GM/60ML suspension Take 15 g by mouth 3 (three) times a week.     Historical Provider, MD  warfarin (COUMADIN) 5 MG tablet Take 5 mg by mouth every other day.    Historical Provider, MD  warfarin (COUMADIN) 7.5 MG tablet Take 7.5 mg by mouth every other day. Alternating with the warfarin 5 mg.    Historical Provider, MD    Allergies Morphine; Efavirenz; Statins; Sulfamethizole; and Morphine and related  Family History  Problem Relation Age of Onset  . Heart failure Mother   . Diabetes Mother     Social History Social History  Substance Use Topics  . Smoking status: Never Smoker  . Smokeless tobacco: Never Used  . Alcohol use No    Review of Systems  Constitutional: Negative for fever. Cardiovascular: Negative for chest pain. Respiratory: Negative for shortness of breath. Gastrointestinal: Negative for abdominal pain. Positive for nausea and vomiting. Genitourinary: Negative for dysuria. Musculoskeletal: Negative for back pain. Skin: Negative for rash. Neurological: Negative for headaches, focal weakness or numbness.  10-point ROS otherwise negative.  ____________________________________________   PHYSICAL EXAM:  VITAL SIGNS: ED Triage Vitals [08/01/16 1103]  Enc Vitals Group     BP 198/88     Pulse 70     Resp 16     Temp 97.9     Temp src      SpO2 98     Weight 170 lb (77.1 kg)     Height 6\' 1"  (1.854 m)   Constitutional: Alert and oriented. Well appearing and in no distress. Eyes: Conjunctivae are normal. Normal extraocular movements. ENT   Head: Normocephalic and atraumatic.   Nose: No congestion/rhinnorhea.   Mouth/Throat: Mucous membranes are moist.   Neck: No stridor. Hematological/Lymphatic/Immunilogical: No cervical lymphadenopathy. Cardiovascular: Normal rate, regular rhythm.  No murmurs, rubs, or gallops. Respiratory: Normal respiratory effort without tachypnea nor retractions. Breath sounds are clear and equal bilaterally. No wheezes/rales/rhonchi. Gastrointestinal: Soft and nontender. No  distention.  Genitourinary: Deferred Musculoskeletal: Normal range of motion in all extremities. No lower extremity edema. Neurologic:  Normal speech and language. No gross focal neurologic deficits are appreciated.  Skin:  Skin is warm, dry and intact. No rash noted. Psychiatric: Mood and affect are normal. Speech and behavior are normal. Patient exhibits appropriate insight and judgment.  ____________________________________________    LABS (pertinent positives/negatives)  Labs Reviewed  CBC WITH DIFFERENTIAL/PLATELET - Abnormal; Notable for the following:       Result Value   WBC 11.3 (*)    RBC 3.46 (*)    Hemoglobin 11.2 (*)    HCT 33.6 (*)    RDW 17.3 (*)    Platelets 129 (*)    Neutro Abs 6.7 (*)    All other components within normal limits  BASIC METABOLIC PANEL - Abnormal; Notable for the following:    Glucose, Bld 144 (*)  BUN 35 (*)    Creatinine, Ser 2.52 (*)    GFR calc non Af Amer 24 (*)    GFR calc Af Amer 28 (*)    All other components within normal limits  TROPONIN I - Abnormal; Notable for the following:    Troponin I 0.03 (*)    All other components within normal limits  URINALYSIS COMPLETEWITH MICROSCOPIC ([facility] ONLY) - Abnormal; Notable for the following:    Color, Urine YELLOW (*)    APPearance CLEAR (*)    Glucose, UA 50 (*)    Hgb urine dipstick 1+ (*)    Protein, ur >500 (*)    All other components within normal limits  TROPONIN I - Abnormal; Notable for the following:    Troponin I 0.03 (*)    All other components within normal limits     ____________________________________________   EKG  I, Nance Pear, attending physician, personally viewed and interpreted this EKG  EKG Time: 1108 Rate: 72 Rhythm: sinus rhythm Axis: normal Intervals: qtc 439 QRS: LBBB ST changes: no st elevation Impression: abnormal ekg   ____________________________________________     RADIOLOGY  None  ____________________________________________   PROCEDURES  Procedures  ____________________________________________   INITIAL IMPRESSION / ASSESSMENT AND PLAN / ED COURSE  Pertinent labs & imaging results that were available during my care of the patient were reviewed by me and considered in my medical decision making (see chart for details).  Patient here with concerns for nausea vomiting and dizziness. Workup. Without concerning findings including 2 troponins. Do think is possible this could be coming from sinusitis. Will discharge with Z-Pak and have patient follow-up with primary care. ____________________________________________   FINAL CLINICAL IMPRESSION(S) / ED DIAGNOSES  Final diagnoses:  Sinusitis, unspecified chronicity, unspecified location     Note: This dictation was prepared with Dragon dictation. Any transcriptional errors that result from this process are unintentional    Nance Pear, MD 08/01/16 1550

## 2016-08-01 NOTE — ED Triage Notes (Signed)
Pt came to ED via EMS c/o nausea, vomiting. Reports started with sinus pressure, feeling like his ears were clogged up. Today, n/v, dizziness. Reports having trouble walking.

## 2016-08-05 DIAGNOSIS — D631 Anemia in chronic kidney disease: Secondary | ICD-10-CM | POA: Diagnosis not present

## 2016-08-05 DIAGNOSIS — N2581 Secondary hyperparathyroidism of renal origin: Secondary | ICD-10-CM | POA: Diagnosis not present

## 2016-08-05 DIAGNOSIS — I10 Essential (primary) hypertension: Secondary | ICD-10-CM | POA: Diagnosis not present

## 2016-08-05 DIAGNOSIS — N184 Chronic kidney disease, stage 4 (severe): Secondary | ICD-10-CM | POA: Diagnosis not present

## 2016-08-12 DIAGNOSIS — I519 Heart disease, unspecified: Secondary | ICD-10-CM | POA: Diagnosis not present

## 2016-08-12 DIAGNOSIS — E7800 Pure hypercholesterolemia, unspecified: Secondary | ICD-10-CM | POA: Diagnosis not present

## 2016-08-12 DIAGNOSIS — I2510 Atherosclerotic heart disease of native coronary artery without angina pectoris: Secondary | ICD-10-CM | POA: Diagnosis not present

## 2016-08-12 DIAGNOSIS — I493 Ventricular premature depolarization: Secondary | ICD-10-CM | POA: Diagnosis not present

## 2016-08-12 DIAGNOSIS — E1100 Type 2 diabetes mellitus with hyperosmolarity without nonketotic hyperglycemic-hyperosmolar coma (NKHHC): Secondary | ICD-10-CM | POA: Diagnosis not present

## 2016-08-12 DIAGNOSIS — R0602 Shortness of breath: Secondary | ICD-10-CM | POA: Diagnosis not present

## 2016-08-12 DIAGNOSIS — Z955 Presence of coronary angioplasty implant and graft: Secondary | ICD-10-CM | POA: Diagnosis not present

## 2016-08-12 DIAGNOSIS — Z95810 Presence of automatic (implantable) cardiac defibrillator: Secondary | ICD-10-CM | POA: Diagnosis not present

## 2016-08-12 DIAGNOSIS — I255 Ischemic cardiomyopathy: Secondary | ICD-10-CM | POA: Diagnosis not present

## 2016-08-12 DIAGNOSIS — I10 Essential (primary) hypertension: Secondary | ICD-10-CM | POA: Diagnosis not present

## 2016-08-12 DIAGNOSIS — I480 Paroxysmal atrial fibrillation: Secondary | ICD-10-CM | POA: Diagnosis not present

## 2016-08-12 DIAGNOSIS — I5022 Chronic systolic (congestive) heart failure: Secondary | ICD-10-CM | POA: Diagnosis not present

## 2016-08-22 DIAGNOSIS — T375X5A Adverse effect of antiviral drugs, initial encounter: Secondary | ICD-10-CM | POA: Diagnosis not present

## 2016-08-22 DIAGNOSIS — K7689 Other specified diseases of liver: Secondary | ICD-10-CM | POA: Diagnosis not present

## 2016-08-22 DIAGNOSIS — I119 Hypertensive heart disease without heart failure: Secondary | ICD-10-CM | POA: Diagnosis not present

## 2016-08-22 DIAGNOSIS — F4310 Post-traumatic stress disorder, unspecified: Secondary | ICD-10-CM | POA: Diagnosis not present

## 2016-09-23 DIAGNOSIS — H40003 Preglaucoma, unspecified, bilateral: Secondary | ICD-10-CM | POA: Diagnosis not present

## 2016-09-30 DIAGNOSIS — K7689 Other specified diseases of liver: Secondary | ICD-10-CM | POA: Diagnosis not present

## 2016-09-30 DIAGNOSIS — F4310 Post-traumatic stress disorder, unspecified: Secondary | ICD-10-CM | POA: Diagnosis not present

## 2016-09-30 DIAGNOSIS — B20 Human immunodeficiency virus [HIV] disease: Secondary | ICD-10-CM | POA: Diagnosis not present

## 2016-09-30 DIAGNOSIS — I119 Hypertensive heart disease without heart failure: Secondary | ICD-10-CM | POA: Diagnosis not present

## 2016-10-28 DIAGNOSIS — B20 Human immunodeficiency virus [HIV] disease: Secondary | ICD-10-CM | POA: Diagnosis not present

## 2016-10-28 DIAGNOSIS — K7689 Other specified diseases of liver: Secondary | ICD-10-CM | POA: Diagnosis not present

## 2016-10-28 DIAGNOSIS — F4310 Post-traumatic stress disorder, unspecified: Secondary | ICD-10-CM | POA: Diagnosis not present

## 2016-10-28 DIAGNOSIS — I209 Angina pectoris, unspecified: Secondary | ICD-10-CM | POA: Diagnosis not present

## 2016-10-28 DIAGNOSIS — I119 Hypertensive heart disease without heart failure: Secondary | ICD-10-CM | POA: Diagnosis not present

## 2016-11-07 DIAGNOSIS — E113593 Type 2 diabetes mellitus with proliferative diabetic retinopathy without macular edema, bilateral: Secondary | ICD-10-CM | POA: Diagnosis not present

## 2016-11-19 DIAGNOSIS — N184 Chronic kidney disease, stage 4 (severe): Secondary | ICD-10-CM | POA: Diagnosis not present

## 2016-11-19 DIAGNOSIS — I10 Essential (primary) hypertension: Secondary | ICD-10-CM | POA: Diagnosis not present

## 2016-11-19 DIAGNOSIS — N2581 Secondary hyperparathyroidism of renal origin: Secondary | ICD-10-CM | POA: Diagnosis not present

## 2016-11-19 DIAGNOSIS — D631 Anemia in chronic kidney disease: Secondary | ICD-10-CM | POA: Diagnosis not present

## 2016-11-20 DIAGNOSIS — I255 Ischemic cardiomyopathy: Secondary | ICD-10-CM | POA: Diagnosis not present

## 2016-11-20 DIAGNOSIS — I472 Ventricular tachycardia: Secondary | ICD-10-CM | POA: Diagnosis not present

## 2016-11-20 DIAGNOSIS — Z95810 Presence of automatic (implantable) cardiac defibrillator: Secondary | ICD-10-CM | POA: Diagnosis not present

## 2016-11-25 DIAGNOSIS — R201 Hypoesthesia of skin: Secondary | ICD-10-CM | POA: Diagnosis not present

## 2016-11-25 DIAGNOSIS — E8881 Metabolic syndrome: Secondary | ICD-10-CM | POA: Diagnosis not present

## 2016-11-25 DIAGNOSIS — R000 Tachycardia, unspecified: Secondary | ICD-10-CM | POA: Diagnosis not present

## 2016-11-25 DIAGNOSIS — I739 Peripheral vascular disease, unspecified: Secondary | ICD-10-CM | POA: Diagnosis not present

## 2016-12-01 DIAGNOSIS — R201 Hypoesthesia of skin: Secondary | ICD-10-CM | POA: Diagnosis not present

## 2016-12-01 DIAGNOSIS — E8881 Metabolic syndrome: Secondary | ICD-10-CM | POA: Diagnosis not present

## 2016-12-01 DIAGNOSIS — R000 Tachycardia, unspecified: Secondary | ICD-10-CM | POA: Diagnosis not present

## 2016-12-01 DIAGNOSIS — I739 Peripheral vascular disease, unspecified: Secondary | ICD-10-CM | POA: Diagnosis not present

## 2016-12-03 ENCOUNTER — Other Ambulatory Visit (INDEPENDENT_AMBULATORY_CARE_PROVIDER_SITE_OTHER): Payer: Self-pay | Admitting: Vascular Surgery

## 2016-12-03 DIAGNOSIS — I779 Disorder of arteries and arterioles, unspecified: Secondary | ICD-10-CM

## 2016-12-03 DIAGNOSIS — I739 Peripheral vascular disease, unspecified: Principal | ICD-10-CM

## 2016-12-04 ENCOUNTER — Ambulatory Visit (INDEPENDENT_AMBULATORY_CARE_PROVIDER_SITE_OTHER): Payer: Medicare Other

## 2016-12-04 DIAGNOSIS — I739 Peripheral vascular disease, unspecified: Principal | ICD-10-CM

## 2016-12-04 DIAGNOSIS — I779 Disorder of arteries and arterioles, unspecified: Secondary | ICD-10-CM | POA: Diagnosis not present

## 2016-12-05 LAB — VAS US CAROTID
LCCADDIAS: -46 cm/s
LCCADSYS: -69 cm/s
LEFT ECA DIAS: -2 cm/s
LEFT VERTEBRAL DIAS: 28 cm/s
LICADSYS: -65 cm/s
LICAPDIAS: 15 cm/s
Left CCA prox dias: 11 cm/s
Left CCA prox sys: 67 cm/s
Left ICA dist dias: -17 cm/s
Left ICA prox sys: 46 cm/s
RCCAPDIAS: 14 cm/s
RIGHT CCA MID DIAS: -14 cm/s
RIGHT ECA DIAS: -2 cm/s
RIGHT VERTEBRAL DIAS: 13 cm/s
Right CCA prox sys: 83 cm/s
Right cca dist sys: -91 cm/s

## 2016-12-10 ENCOUNTER — Encounter (INDEPENDENT_AMBULATORY_CARE_PROVIDER_SITE_OTHER): Payer: Self-pay | Admitting: Vascular Surgery

## 2016-12-11 DIAGNOSIS — Z955 Presence of coronary angioplasty implant and graft: Secondary | ICD-10-CM | POA: Diagnosis not present

## 2016-12-11 DIAGNOSIS — I10 Essential (primary) hypertension: Secondary | ICD-10-CM | POA: Diagnosis not present

## 2016-12-11 DIAGNOSIS — F4310 Post-traumatic stress disorder, unspecified: Secondary | ICD-10-CM | POA: Diagnosis not present

## 2016-12-11 DIAGNOSIS — I2510 Atherosclerotic heart disease of native coronary artery without angina pectoris: Secondary | ICD-10-CM | POA: Diagnosis not present

## 2016-12-11 DIAGNOSIS — E7800 Pure hypercholesterolemia, unspecified: Secondary | ICD-10-CM | POA: Diagnosis not present

## 2016-12-11 DIAGNOSIS — Z95810 Presence of automatic (implantable) cardiac defibrillator: Secondary | ICD-10-CM | POA: Diagnosis not present

## 2016-12-11 DIAGNOSIS — I429 Cardiomyopathy, unspecified: Secondary | ICD-10-CM | POA: Diagnosis not present

## 2016-12-11 DIAGNOSIS — I255 Ischemic cardiomyopathy: Secondary | ICD-10-CM | POA: Diagnosis not present

## 2016-12-11 DIAGNOSIS — E1100 Type 2 diabetes mellitus with hyperosmolarity without nonketotic hyperglycemic-hyperosmolar coma (NKHHC): Secondary | ICD-10-CM | POA: Diagnosis not present

## 2016-12-11 DIAGNOSIS — I493 Ventricular premature depolarization: Secondary | ICD-10-CM | POA: Diagnosis not present

## 2016-12-11 DIAGNOSIS — R0602 Shortness of breath: Secondary | ICD-10-CM | POA: Diagnosis not present

## 2016-12-11 DIAGNOSIS — I480 Paroxysmal atrial fibrillation: Secondary | ICD-10-CM | POA: Diagnosis not present

## 2016-12-12 ENCOUNTER — Ambulatory Visit (INDEPENDENT_AMBULATORY_CARE_PROVIDER_SITE_OTHER): Payer: Medicare Other | Admitting: Vascular Surgery

## 2016-12-22 DIAGNOSIS — D509 Iron deficiency anemia, unspecified: Secondary | ICD-10-CM | POA: Diagnosis not present

## 2016-12-22 DIAGNOSIS — M792 Neuralgia and neuritis, unspecified: Secondary | ICD-10-CM | POA: Diagnosis not present

## 2016-12-22 DIAGNOSIS — E8881 Metabolic syndrome: Secondary | ICD-10-CM | POA: Diagnosis not present

## 2016-12-24 DIAGNOSIS — H40003 Preglaucoma, unspecified, bilateral: Secondary | ICD-10-CM | POA: Diagnosis not present

## 2017-01-26 DIAGNOSIS — D509 Iron deficiency anemia, unspecified: Secondary | ICD-10-CM | POA: Diagnosis not present

## 2017-01-26 DIAGNOSIS — E8881 Metabolic syndrome: Secondary | ICD-10-CM | POA: Diagnosis not present

## 2017-01-26 DIAGNOSIS — I4891 Unspecified atrial fibrillation: Secondary | ICD-10-CM | POA: Diagnosis not present

## 2017-01-26 DIAGNOSIS — I739 Peripheral vascular disease, unspecified: Secondary | ICD-10-CM | POA: Diagnosis not present

## 2017-02-16 DIAGNOSIS — N184 Chronic kidney disease, stage 4 (severe): Secondary | ICD-10-CM | POA: Diagnosis not present

## 2017-02-16 DIAGNOSIS — D631 Anemia in chronic kidney disease: Secondary | ICD-10-CM | POA: Diagnosis not present

## 2017-02-16 DIAGNOSIS — N2581 Secondary hyperparathyroidism of renal origin: Secondary | ICD-10-CM | POA: Diagnosis not present

## 2017-02-16 DIAGNOSIS — I10 Essential (primary) hypertension: Secondary | ICD-10-CM | POA: Diagnosis not present

## 2017-02-16 DIAGNOSIS — E875 Hyperkalemia: Secondary | ICD-10-CM | POA: Diagnosis not present

## 2017-02-19 DIAGNOSIS — R000 Tachycardia, unspecified: Secondary | ICD-10-CM | POA: Diagnosis not present

## 2017-02-19 DIAGNOSIS — I4891 Unspecified atrial fibrillation: Secondary | ICD-10-CM | POA: Diagnosis not present

## 2017-02-19 DIAGNOSIS — I10 Essential (primary) hypertension: Secondary | ICD-10-CM | POA: Diagnosis not present

## 2017-02-19 DIAGNOSIS — E860 Dehydration: Secondary | ICD-10-CM | POA: Diagnosis not present

## 2017-02-19 DIAGNOSIS — I2510 Atherosclerotic heart disease of native coronary artery without angina pectoris: Secondary | ICD-10-CM | POA: Diagnosis not present

## 2017-02-23 DIAGNOSIS — I509 Heart failure, unspecified: Secondary | ICD-10-CM | POA: Diagnosis not present

## 2017-02-23 DIAGNOSIS — E119 Type 2 diabetes mellitus without complications: Secondary | ICD-10-CM | POA: Diagnosis not present

## 2017-02-23 DIAGNOSIS — R002 Palpitations: Secondary | ICD-10-CM | POA: Diagnosis not present

## 2017-02-23 DIAGNOSIS — R0602 Shortness of breath: Secondary | ICD-10-CM | POA: Diagnosis not present

## 2017-03-06 DIAGNOSIS — B20 Human immunodeficiency virus [HIV] disease: Secondary | ICD-10-CM | POA: Diagnosis not present

## 2017-03-06 DIAGNOSIS — I739 Peripheral vascular disease, unspecified: Secondary | ICD-10-CM | POA: Diagnosis not present

## 2017-03-06 DIAGNOSIS — Z1389 Encounter for screening for other disorder: Secondary | ICD-10-CM | POA: Diagnosis not present

## 2017-03-06 DIAGNOSIS — I469 Cardiac arrest, cause unspecified: Secondary | ICD-10-CM | POA: Diagnosis not present

## 2017-03-06 DIAGNOSIS — F4310 Post-traumatic stress disorder, unspecified: Secondary | ICD-10-CM | POA: Diagnosis not present

## 2017-03-20 DIAGNOSIS — I10 Essential (primary) hypertension: Secondary | ICD-10-CM | POA: Diagnosis not present

## 2017-03-20 DIAGNOSIS — R000 Tachycardia, unspecified: Secondary | ICD-10-CM | POA: Diagnosis not present

## 2017-03-20 DIAGNOSIS — I4891 Unspecified atrial fibrillation: Secondary | ICD-10-CM | POA: Diagnosis not present

## 2017-03-20 DIAGNOSIS — E860 Dehydration: Secondary | ICD-10-CM | POA: Diagnosis not present

## 2017-03-20 DIAGNOSIS — I2510 Atherosclerotic heart disease of native coronary artery without angina pectoris: Secondary | ICD-10-CM | POA: Diagnosis not present

## 2017-03-27 DIAGNOSIS — I472 Ventricular tachycardia: Secondary | ICD-10-CM | POA: Diagnosis not present

## 2017-03-27 DIAGNOSIS — Z21 Asymptomatic human immunodeficiency virus [HIV] infection status: Secondary | ICD-10-CM | POA: Diagnosis not present

## 2017-03-27 DIAGNOSIS — I255 Ischemic cardiomyopathy: Secondary | ICD-10-CM | POA: Diagnosis not present

## 2017-03-27 DIAGNOSIS — Z95810 Presence of automatic (implantable) cardiac defibrillator: Secondary | ICD-10-CM | POA: Diagnosis not present

## 2017-03-27 DIAGNOSIS — Z7901 Long term (current) use of anticoagulants: Secondary | ICD-10-CM | POA: Diagnosis not present

## 2017-03-27 DIAGNOSIS — B20 Human immunodeficiency virus [HIV] disease: Secondary | ICD-10-CM | POA: Diagnosis not present

## 2017-03-27 DIAGNOSIS — I495 Sick sinus syndrome: Secondary | ICD-10-CM | POA: Diagnosis not present

## 2017-03-27 DIAGNOSIS — R001 Bradycardia, unspecified: Secondary | ICD-10-CM | POA: Diagnosis not present

## 2017-03-27 DIAGNOSIS — Z950 Presence of cardiac pacemaker: Secondary | ICD-10-CM | POA: Diagnosis not present

## 2017-03-27 DIAGNOSIS — I499 Cardiac arrhythmia, unspecified: Secondary | ICD-10-CM | POA: Diagnosis not present

## 2017-03-27 DIAGNOSIS — I481 Persistent atrial fibrillation: Secondary | ICD-10-CM | POA: Diagnosis not present

## 2017-03-30 DIAGNOSIS — I255 Ischemic cardiomyopathy: Secondary | ICD-10-CM | POA: Diagnosis not present

## 2017-04-08 DIAGNOSIS — I519 Heart disease, unspecified: Secondary | ICD-10-CM | POA: Diagnosis not present

## 2017-04-08 DIAGNOSIS — I493 Ventricular premature depolarization: Secondary | ICD-10-CM | POA: Diagnosis not present

## 2017-04-08 DIAGNOSIS — I2510 Atherosclerotic heart disease of native coronary artery without angina pectoris: Secondary | ICD-10-CM | POA: Diagnosis not present

## 2017-04-08 DIAGNOSIS — N183 Chronic kidney disease, stage 3 (moderate): Secondary | ICD-10-CM | POA: Diagnosis not present

## 2017-04-08 DIAGNOSIS — R0602 Shortness of breath: Secondary | ICD-10-CM | POA: Diagnosis not present

## 2017-04-08 DIAGNOSIS — E1100 Type 2 diabetes mellitus with hyperosmolarity without nonketotic hyperglycemic-hyperosmolar coma (NKHHC): Secondary | ICD-10-CM | POA: Diagnosis not present

## 2017-04-08 DIAGNOSIS — I429 Cardiomyopathy, unspecified: Secondary | ICD-10-CM | POA: Diagnosis not present

## 2017-04-08 DIAGNOSIS — I255 Ischemic cardiomyopathy: Secondary | ICD-10-CM | POA: Diagnosis not present

## 2017-04-08 DIAGNOSIS — I480 Paroxysmal atrial fibrillation: Secondary | ICD-10-CM | POA: Diagnosis not present

## 2017-04-08 DIAGNOSIS — I10 Essential (primary) hypertension: Secondary | ICD-10-CM | POA: Diagnosis not present

## 2017-04-17 DIAGNOSIS — E113593 Type 2 diabetes mellitus with proliferative diabetic retinopathy without macular edema, bilateral: Secondary | ICD-10-CM | POA: Diagnosis not present

## 2017-04-20 DIAGNOSIS — N184 Chronic kidney disease, stage 4 (severe): Secondary | ICD-10-CM | POA: Diagnosis not present

## 2017-04-20 DIAGNOSIS — N2581 Secondary hyperparathyroidism of renal origin: Secondary | ICD-10-CM | POA: Diagnosis not present

## 2017-04-20 DIAGNOSIS — I10 Essential (primary) hypertension: Secondary | ICD-10-CM | POA: Diagnosis not present

## 2017-04-20 DIAGNOSIS — E875 Hyperkalemia: Secondary | ICD-10-CM | POA: Diagnosis not present

## 2017-04-20 DIAGNOSIS — D631 Anemia in chronic kidney disease: Secondary | ICD-10-CM | POA: Diagnosis not present

## 2017-04-27 DIAGNOSIS — E875 Hyperkalemia: Secondary | ICD-10-CM | POA: Diagnosis not present

## 2017-04-27 DIAGNOSIS — D631 Anemia in chronic kidney disease: Secondary | ICD-10-CM | POA: Diagnosis not present

## 2017-04-27 DIAGNOSIS — R600 Localized edema: Secondary | ICD-10-CM | POA: Diagnosis not present

## 2017-04-27 DIAGNOSIS — R809 Proteinuria, unspecified: Secondary | ICD-10-CM | POA: Diagnosis not present

## 2017-04-27 DIAGNOSIS — N183 Chronic kidney disease, stage 3 (moderate): Secondary | ICD-10-CM | POA: Diagnosis not present

## 2017-05-15 DIAGNOSIS — B182 Chronic viral hepatitis C: Secondary | ICD-10-CM | POA: Diagnosis not present

## 2017-05-15 DIAGNOSIS — I4891 Unspecified atrial fibrillation: Secondary | ICD-10-CM | POA: Diagnosis not present

## 2017-05-15 DIAGNOSIS — I509 Heart failure, unspecified: Secondary | ICD-10-CM | POA: Diagnosis not present

## 2017-05-15 DIAGNOSIS — N183 Chronic kidney disease, stage 3 (moderate): Secondary | ICD-10-CM | POA: Diagnosis not present

## 2017-05-29 ENCOUNTER — Encounter: Payer: Self-pay | Admitting: Emergency Medicine

## 2017-05-29 ENCOUNTER — Inpatient Hospital Stay
Admission: EM | Admit: 2017-05-29 | Discharge: 2017-05-30 | DRG: 811 | Disposition: A | Discharge status: Home / Self Care | Payer: Medicare Other | Attending: Internal Medicine | Admitting: Internal Medicine

## 2017-05-29 DIAGNOSIS — R58 Hemorrhage, not elsewhere classified: Secondary | ICD-10-CM | POA: Diagnosis present

## 2017-05-29 DIAGNOSIS — Z833 Family history of diabetes mellitus: Secondary | ICD-10-CM

## 2017-05-29 DIAGNOSIS — I5022 Chronic systolic (congestive) heart failure: Secondary | ICD-10-CM | POA: Diagnosis present

## 2017-05-29 DIAGNOSIS — Z882 Allergy status to sulfonamides status: Secondary | ICD-10-CM

## 2017-05-29 DIAGNOSIS — Z7901 Long term (current) use of anticoagulants: Secondary | ICD-10-CM | POA: Diagnosis not present

## 2017-05-29 DIAGNOSIS — N189 Chronic kidney disease, unspecified: Secondary | ICD-10-CM

## 2017-05-29 DIAGNOSIS — F4310 Post-traumatic stress disorder, unspecified: Secondary | ICD-10-CM | POA: Diagnosis present

## 2017-05-29 DIAGNOSIS — I129 Hypertensive chronic kidney disease with stage 1 through stage 4 chronic kidney disease, or unspecified chronic kidney disease: Secondary | ICD-10-CM | POA: Diagnosis not present

## 2017-05-29 DIAGNOSIS — B20 Human immunodeficiency virus [HIV] disease: Secondary | ICD-10-CM | POA: Diagnosis present

## 2017-05-29 DIAGNOSIS — N179 Acute kidney failure, unspecified: Secondary | ICD-10-CM | POA: Diagnosis not present

## 2017-05-29 DIAGNOSIS — R040 Epistaxis: Secondary | ICD-10-CM | POA: Diagnosis present

## 2017-05-29 DIAGNOSIS — I130 Hypertensive heart and chronic kidney disease with heart failure and stage 1 through stage 4 chronic kidney disease, or unspecified chronic kidney disease: Secondary | ICD-10-CM | POA: Diagnosis present

## 2017-05-29 DIAGNOSIS — I482 Chronic atrial fibrillation: Secondary | ICD-10-CM | POA: Diagnosis not present

## 2017-05-29 DIAGNOSIS — E875 Hyperkalemia: Secondary | ICD-10-CM | POA: Diagnosis present

## 2017-05-29 DIAGNOSIS — I255 Ischemic cardiomyopathy: Secondary | ICD-10-CM | POA: Diagnosis not present

## 2017-05-29 DIAGNOSIS — B1920 Unspecified viral hepatitis C without hepatic coma: Secondary | ICD-10-CM | POA: Diagnosis not present

## 2017-05-29 DIAGNOSIS — D649 Anemia, unspecified: Secondary | ICD-10-CM | POA: Diagnosis present

## 2017-05-29 DIAGNOSIS — K5900 Constipation, unspecified: Secondary | ICD-10-CM | POA: Diagnosis present

## 2017-05-29 DIAGNOSIS — D62 Acute posthemorrhagic anemia: Secondary | ICD-10-CM | POA: Diagnosis not present

## 2017-05-29 DIAGNOSIS — I2510 Atherosclerotic heart disease of native coronary artery without angina pectoris: Secondary | ICD-10-CM | POA: Diagnosis not present

## 2017-05-29 DIAGNOSIS — Z7902 Long term (current) use of antithrombotics/antiplatelets: Secondary | ICD-10-CM | POA: Diagnosis not present

## 2017-05-29 DIAGNOSIS — Z8249 Family history of ischemic heart disease and other diseases of the circulatory system: Secondary | ICD-10-CM | POA: Diagnosis not present

## 2017-05-29 DIAGNOSIS — Z888 Allergy status to other drugs, medicaments and biological substances status: Secondary | ICD-10-CM

## 2017-05-29 DIAGNOSIS — F419 Anxiety disorder, unspecified: Secondary | ICD-10-CM | POA: Diagnosis not present

## 2017-05-29 DIAGNOSIS — E1122 Type 2 diabetes mellitus with diabetic chronic kidney disease: Secondary | ICD-10-CM | POA: Diagnosis present

## 2017-05-29 DIAGNOSIS — Z79899 Other long term (current) drug therapy: Secondary | ICD-10-CM

## 2017-05-29 DIAGNOSIS — R011 Cardiac murmur, unspecified: Secondary | ICD-10-CM | POA: Diagnosis present

## 2017-05-29 DIAGNOSIS — Z885 Allergy status to narcotic agent status: Secondary | ICD-10-CM

## 2017-05-29 DIAGNOSIS — N184 Chronic kidney disease, stage 4 (severe): Secondary | ICD-10-CM | POA: Diagnosis not present

## 2017-05-29 DIAGNOSIS — Z79891 Long term (current) use of opiate analgesic: Secondary | ICD-10-CM

## 2017-05-29 DIAGNOSIS — Z7984 Long term (current) use of oral hypoglycemic drugs: Secondary | ICD-10-CM

## 2017-05-29 DIAGNOSIS — Z955 Presence of coronary angioplasty implant and graft: Secondary | ICD-10-CM

## 2017-05-29 DIAGNOSIS — Z9049 Acquired absence of other specified parts of digestive tract: Secondary | ICD-10-CM

## 2017-05-29 DIAGNOSIS — Z95810 Presence of automatic (implantable) cardiac defibrillator: Secondary | ICD-10-CM

## 2017-05-29 MED ORDER — LIDOCAINE VISCOUS 2 % MT SOLN
15.0000 mL | Freq: Once | OROMUCOSAL | Status: DC
  Filled 2017-05-29: qty 15

## 2017-05-29 MED ORDER — OXYMETAZOLINE HCL 0.05 % NA SOLN
NASAL | Status: AC
  Filled 2017-05-29: qty 15

## 2017-05-29 MED ORDER — OXYMETAZOLINE HCL 0.05 % NA SOLN: 1.0000 | Freq: Once | NASAL | Status: DC

## 2017-05-29 MED ORDER — LIDOCAINE VISCOUS 2 % MT SOLN
OROMUCOSAL | Status: AC
  Filled 2017-05-29: qty 15

## 2017-05-29 MED ORDER — SODIUM CHLORIDE 0.9 % IV BOLUS (SEPSIS)
500.0000 mL | Freq: Once | INTRAVENOUS | Status: AC
Start: 2017-05-30 — End: 2017-05-30
  Administered 2017-05-30: 500 mL via INTRAVENOUS

## 2017-05-29 NOTE — ED Provider Notes (Signed)
[facility] Emergency Department Provider Note   ____________________________________________   First MD Initiated Contact with Patient 05/29/17 2328     (approximate)  I have reviewed the triage vital signs and the nursing notes.   HISTORY  Chief Complaint Epistaxis    HPI Robert Bentley is a 76 y.o. male ho presents to theme with a chief complaint of nosebleed. Patient reports a 9 hour nosebleed to his left nostril 2 days ago. Tonight left nostril started bleeding again approximately 2 PM. Patient wears home oxygen PRN and stateshe has been wearing it more lately. Has never had a nosebleed before this week. Patient is on 3 anticoagulants including Plavix and Coumadin.feels lightheaded and nauseous. Denies associated fever, chills, chest shortness of breath, abdominal pain, vomiting. Denies recent travel or trauma. Nothing makes his symptoms better or worse.   Past Medical History:  Diagnosis Date  . Anxiety   . Atrial fibrillation ([place])   . CHF (congestive heart failure) ([place])   . Coronary artery disease   . Diabetes mellitus without complication ([place])   . Hepatitis C   . HIV disease ([place])   . Hypertension   . PTSD (post-traumatic stress disorder)   . Renal failure     Patient Active Problem List   Diagnosis Date Noted  . Symptomatic anemia 05/30/2017  . Weakness 05/23/2016  . Anemia in chronic renal disease 05/03/2015  . UTI (urinary tract infection) 04/12/2015  . Subtherapeutic international normalized ratio (INR) 04/12/2015  . Infection of urinary tract 04/12/2015  . Chest pain 04/10/2015  . Acute kidney injury ([place]) 04/10/2015    Past Surgical History:  Procedure Laterality Date  . APPENDECTOMY    . cardiac stenting    . CHOLECYSTECTOMY    . EP IMPLANTABLE DEVICE    . FRACTURE SURGERY      Prior to Admission medications   Medication Sig Start Date End Date Taking? Authorizing Provider  ALPRAZolam Duanne Moron) 0.5 MG tablet Take  0.5 mg by mouth 2 (two) times daily as needed for anxiety.   Yes [provider]  bisacodyl (DULCOLAX) 5 MG EC tablet Take 15 mg by mouth daily as needed for moderate constipation.   Yes [provider]  Cholecalciferol (VITAMIN D3) 2000 UNITS TABS Take 1 tablet by mouth daily.   Yes [provider]  clopidogrel (PLAVIX) 75 MG tablet Take 75 mg by mouth daily.   Yes [provider]  diltiazem (DILACOR XR) 240 MG 24 hr capsule Take 240 mg by mouth daily.   Yes [provider]  dolutegravir (TIVICAY) 50 MG tablet Take 50 mg by mouth daily.   Yes [provider]  furosemide (LASIX) 80 MG tablet Take 80 mg by mouth daily as needed for fluid or edema.    Yes [provider]  glimepiride (AMARYL) 2 MG tablet Take 2 mg by mouth daily as needed (for high blood sugar).   Yes [provider]  HYDROcodone-acetaminophen (NORCO) 10-325 MG per tablet Take 1 tablet by mouth every 6 (six) hours as needed for severe pain.   Yes [provider]  isosorbide mononitrate (IMDUR) 30 MG 24 hr tablet Take 15 mg by mouth every morning.    Yes [provider]  lactulose (CHRONULAC) 10 GM/15ML solution Take 20 g by mouth 2 (two) times daily as needed for mild constipation.   Yes [provider]  lamiVUDine (EPIVIR) 150 MG tablet Take 150 mg by mouth daily.   Yes [provider]  latanoprost (XALATAN) 0.005 % ophthalmic solution INSTILL ONE DROP TO BOTH EYES EVERY NIGHT 04/20/15  Yes [provider]  meclizine (ANTIVERT) 25 MG tablet Take 1 tablet (25 mg total) by mouth 3 (three) times daily as needed for dizziness. 08/01/16  Yes Nance Pear, MD  Multiple Vitamins-Iron (MULTIVITAMINS WITH IRON) TABS tablet Take 1 tablet by mouth daily.   Yes [provider]  ondansetron (ZOFRAN) 8 MG tablet Take 8 mg by mouth every 8 (eight) hours as needed for nausea or vomiting.   Yes [provider]    sodium bicarbonate 650 MG tablet Take 1,300 mg by mouth 2 (two) times daily.    Yes [provider]  sodium polystyrene (KAYEXALATE) 15 GM/60ML suspension Take 15 g by mouth 3 (three) times a week.   Yes [provider]  timolol (BETIMOL) 0.5 % ophthalmic solution Place 1 drop into both eyes at bedtime.   Yes [provider]  warfarin (COUMADIN) 5 MG tablet Take 5 mg by mouth daily.    Yes [provider]    Allergies Morphine; Efavirenz; Statins; Sulfamethizole; and Morphine and related  Family History  Problem Relation Age of Onset  . Heart failure Mother   . Diabetes Mother     Social History Social History  Substance Use Topics  . Smoking status: Never Smoker  . Smokeless tobacco: Never Used  . Alcohol use No    Review of Systems  Constitutional: Positive for lightheadedness. No fever/chills. Eyes: No visual changes. ENT: Positive for nosebleed. No sore throat. Cardiovascular: Denies chest pain. Respiratory: Denies shortness of breath. Gastrointestinal: No abdominal pain.  Positive for nausea, no vomiting.  No diarrhea.  No constipation. Genitourinary: Negative for dysuria. Musculoskeletal: Negative for back pain. Skin: Negative for rash. Neurological: Negative for headaches, focal weakness or numbness.   ____________________________________________   PHYSICAL EXAM:  VITAL SIGNS: ED Triage Vitals  Enc Vitals Group     BP 05/29/17 2228 (!) 148/67     Pulse Rate 05/29/17 2228 77     Resp 05/29/17 2228 16     Temp --      Temp src --      SpO2 05/29/17 2228 99 %     Weight 05/29/17 2228 174 lb (78.9 kg)     Height 05/29/17 2228 6\' 1"  (1.854 m)     Head Circumference --      Peak Flow --      Pain Score 05/29/17 2234 0     Pain Loc --      Pain Edu? --      Excl. in [place]? --     Constitutional: Alert and oriented. Pale appearing and in mild acute distress. Eyes: Conjunctivae are normal. PERRL. EOMI. Head:  Atraumatic. Nose: Left nostril with tissue packing and clamp in place. Packing removed. No current bleeding. Fresh clot seen posteriorly. Mouth/Throat: No active bleeding down posterior oropharynx. Mucous membranes are moist.  Oropharynx non-erythematous. Neck: No stridor.   Cardiovascular: Normal rate, regular rhythm. Grossly normal heart sounds.  Good peripheral circulation. Respiratory: Normal respiratory effort.  No retractions. Lungs CTAB. Gastrointestinal: Soft and nontender. No distention. No abdominal bruits. No CVA tenderness. Musculoskeletal: No lower extremity tenderness nor edema.  No joint effusions. Neurologic:  Normal speech and language. No gross focal neurologic deficits are appreciated. No gait instability. Skin:  Skin is pale, cool, dry and intact. No rash noted. Psychiatric: Mood and affect are normal. Speech and behavior are normal.  ____________________________________________   LABS (all labs ordered are listed, but only abnormal results are displayed)  Labs Reviewed  CBC WITH DIFFERENTIAL/PLATELET - Abnormal; Notable for the following:       Result Value   RBC 2.47 (*)    Hemoglobin 7.6 (*)    HCT 23.4 (*)    RDW 15.2 (*)    Platelets 134 (*)    Neutro Abs 7.2 (*)    All other components within normal limits  BASIC METABOLIC PANEL - Abnormal; Notable for the following:    Potassium 5.4 (*)    Glucose, Bld 242 (*)    BUN 85 (*)    Creatinine, Ser 3.70 (*)    Calcium 8.3 (*)    GFR calc non Af Amer 15 (*)    GFR calc Af Amer 17 (*)    All other components within normal limits  PROTIME-INR - Abnormal; Notable for the following:    Prothrombin Time 22.3 (*)    All other components within normal limits  TSH - Abnormal; Notable for the following:    TSH 4.633 (*)    All other components within normal limits  HEMOGLOBIN A1C  PREPARE RBC (CROSSMATCH)  TYPE AND SCREEN  ABO/RH   ____________________________________________  EKG  ED ECG REPORT I,  Camdon Saetern J, the attending physician, personally viewed and interpreted this ECG.   Date: 05/30/2017  EKG Time: 0034  Rate: 76  Rhythm:atrial fibrillation, rate 76  Axis: LAD  Intervals:left bundle branch block  ST&T Change: [place]  ____________________________________________  RADIOLOGY  No results found.  ____________________________________________   PROCEDURES  Procedure(s) performed: None  Nasal packing with Merocel  Procedures  Critical Care performed: Yes, see critical care note(s)  CRITICAL CARE Performed by: Paulette Blanch   Total critical care time: 45 minutes  Critical care time was exclusive of separately billable procedures and treating other patients.  Critical care was necessary to treat or prevent imminent or life-threatening deterioration.  Critical care was time spent personally by me on the following activities: development of treatment plan with patient and/or surrogate as well as nursing, discussions with consultants, evaluation of patient's response to treatment, examination of patient, obtaining history from patient or surrogate, ordering and performing treatments and interventions, ordering and review of laboratory studies, ordering and review of radiographic studies, pulse oximetry and re-evaluation of patient's condition. ____________________________________________   INITIAL IMPRESSION / ASSESSMENT AND PLAN / ED COURSE  Pertinent labs & imaging results that were available during my care of the patient were reviewed by me and considered in my medical decision making (see chart for details).  76 year old male on 3 anticoagulants with presents with 2nd prolonged nosebleed in 2 days. Pale, lightheaded and nauseous. Although nosebleed is controlled at present, given duration of patient's nosebleed over the past 2 days, and visible clots in left nostril suggestive of posterior epistaxis, will immediately place Merocel.   Clinical Course as of May 30 633  Fri May 29, 2017  2351 Merocel with viscous lidocaine placed with good hemostatic effect. Patient tolerated procedure well. Will check screening labwork, INR, check orthostatics and infuse IV fluids.  [JS]  Sat May 30, 2017  0025 Discuss with hospitalist who will evaluate patient in the emergency department for admission. Updated patient and spouse of lab results. Patient requesting to take his own medications from home. Discussed risks/benefits of blood transfusion; patient consents. Mildly orthostatic.  [JS]    Clinical Course User Index [JS] Paulette Blanch, MD  ____________________________________________   FINAL CLINICAL IMPRESSION(S) / ED DIAGNOSES  Final diagnoses:  Left-sided epistaxis  Anemia, unspecified type  Hemorrhage  Acute renal failure superimposed on chronic kidney disease, unspecified CKD stage, unspecified acute renal failure type ([place])      NEW MEDICATIONS STARTED DURING THIS VISIT:  Current Discharge Medication List       Note:  This document was prepared using Dragon voice recognition software and may include unintentional dictation errors.    Paulette Blanch, MD 05/30/17 [phone]

## 2017-05-29 NOTE — ED Triage Notes (Signed)
Pt arrives POV to triage with c/o nosebleed x 2 days. Pt denies trauma. Pt is in NAD at this time.

## 2017-05-29 NOTE — ED Triage Notes (Signed)
First Nurse Note:  ARrives with epistaxis.  Onset of symptoms 1400 today.  Patient wears home oxygen.  Pressure applied with nose clip.

## 2017-05-29 NOTE — ED Notes (Addendum)
Pt states he needs to get this bleeding to stop because he is loosing too much blood. Pt denies any pain at this time. Pt does state he wears O2 at home and is not able to use it because of all the congestion from the blood which is causing him to have difficulty breathing. MD notified.

## 2017-05-29 NOTE — ED Notes (Signed)
MD Beather Arbour at bedside. Pt given warm blanket.

## 2017-05-29 NOTE — ED Notes (Signed)
MD at bedside. 

## 2017-05-30 DIAGNOSIS — E875 Hyperkalemia: Secondary | ICD-10-CM | POA: Diagnosis present

## 2017-05-30 DIAGNOSIS — F419 Anxiety disorder, unspecified: Secondary | ICD-10-CM | POA: Diagnosis present

## 2017-05-30 DIAGNOSIS — Z882 Allergy status to sulfonamides status: Secondary | ICD-10-CM | POA: Diagnosis not present

## 2017-05-30 DIAGNOSIS — I130 Hypertensive heart and chronic kidney disease with heart failure and stage 1 through stage 4 chronic kidney disease, or unspecified chronic kidney disease: Secondary | ICD-10-CM | POA: Diagnosis present

## 2017-05-30 DIAGNOSIS — I482 Chronic atrial fibrillation: Secondary | ICD-10-CM | POA: Diagnosis present

## 2017-05-30 DIAGNOSIS — D62 Acute posthemorrhagic anemia: Secondary | ICD-10-CM | POA: Diagnosis present

## 2017-05-30 DIAGNOSIS — E1122 Type 2 diabetes mellitus with diabetic chronic kidney disease: Secondary | ICD-10-CM | POA: Diagnosis present

## 2017-05-30 DIAGNOSIS — I4891 Unspecified atrial fibrillation: Secondary | ICD-10-CM | POA: Diagnosis not present

## 2017-05-30 DIAGNOSIS — Z833 Family history of diabetes mellitus: Secondary | ICD-10-CM | POA: Diagnosis not present

## 2017-05-30 DIAGNOSIS — F4310 Post-traumatic stress disorder, unspecified: Secondary | ICD-10-CM | POA: Diagnosis present

## 2017-05-30 DIAGNOSIS — Z95810 Presence of automatic (implantable) cardiac defibrillator: Secondary | ICD-10-CM | POA: Diagnosis not present

## 2017-05-30 DIAGNOSIS — R58 Hemorrhage, not elsewhere classified: Secondary | ICD-10-CM | POA: Diagnosis not present

## 2017-05-30 DIAGNOSIS — I255 Ischemic cardiomyopathy: Secondary | ICD-10-CM | POA: Diagnosis present

## 2017-05-30 DIAGNOSIS — I2510 Atherosclerotic heart disease of native coronary artery without angina pectoris: Secondary | ICD-10-CM | POA: Diagnosis present

## 2017-05-30 DIAGNOSIS — R040 Epistaxis: Secondary | ICD-10-CM | POA: Diagnosis present

## 2017-05-30 DIAGNOSIS — Z888 Allergy status to other drugs, medicaments and biological substances status: Secondary | ICD-10-CM | POA: Diagnosis not present

## 2017-05-30 DIAGNOSIS — B1920 Unspecified viral hepatitis C without hepatic coma: Secondary | ICD-10-CM | POA: Diagnosis present

## 2017-05-30 DIAGNOSIS — Z7902 Long term (current) use of antithrombotics/antiplatelets: Secondary | ICD-10-CM | POA: Diagnosis not present

## 2017-05-30 DIAGNOSIS — Z8249 Family history of ischemic heart disease and other diseases of the circulatory system: Secondary | ICD-10-CM | POA: Diagnosis not present

## 2017-05-30 DIAGNOSIS — Z885 Allergy status to narcotic agent status: Secondary | ICD-10-CM | POA: Diagnosis not present

## 2017-05-30 DIAGNOSIS — Z7901 Long term (current) use of anticoagulants: Secondary | ICD-10-CM | POA: Diagnosis not present

## 2017-05-30 DIAGNOSIS — Z955 Presence of coronary angioplasty implant and graft: Secondary | ICD-10-CM | POA: Diagnosis not present

## 2017-05-30 DIAGNOSIS — D649 Anemia, unspecified: Secondary | ICD-10-CM | POA: Diagnosis present

## 2017-05-30 DIAGNOSIS — B20 Human immunodeficiency virus [HIV] disease: Secondary | ICD-10-CM | POA: Diagnosis present

## 2017-05-30 DIAGNOSIS — N184 Chronic kidney disease, stage 4 (severe): Secondary | ICD-10-CM | POA: Diagnosis present

## 2017-05-30 DIAGNOSIS — I5022 Chronic systolic (congestive) heart failure: Secondary | ICD-10-CM | POA: Diagnosis present

## 2017-05-30 DIAGNOSIS — N179 Acute kidney failure, unspecified: Secondary | ICD-10-CM | POA: Diagnosis present

## 2017-05-30 LAB — ABO/RH: ABO/RH(D): O POS

## 2017-05-30 LAB — BASIC METABOLIC PANEL
ANION GAP: 8 (ref 5–15)
Anion gap: 9 (ref 5–15)
BUN: 85 mg/dL — AB (ref 6–20)
BUN: 94 mg/dL — ABNORMAL HIGH (ref 6–20)
CHLORIDE: 106 mmol/L (ref 101–111)
CHLORIDE: 107 mmol/L (ref 101–111)
CO2: 26 mmol/L (ref 22–32)
CO2: 27 mmol/L (ref 22–32)
CREATININE: 3.7 mg/dL — AB (ref 0.61–1.24)
Calcium: 8.3 mg/dL — ABNORMAL LOW (ref 8.9–10.3)
Calcium: 8.5 mg/dL — ABNORMAL LOW (ref 8.9–10.3)
Creatinine, Ser: 3.44 mg/dL — ABNORMAL HIGH (ref 0.61–1.24)
GFR calc non Af Amer: 15 mL/min — ABNORMAL LOW (ref >60)
GFR calc non Af Amer: 16 mL/min — ABNORMAL LOW (ref >60)
GFR, EST AFRICAN AMERICAN: 17 mL/min — AB (ref >60)
GFR, EST AFRICAN AMERICAN: 19 mL/min — AB (ref >60)
GLUCOSE: 144 mg/dL — AB (ref 65–99)
GLUCOSE: 242 mg/dL — AB (ref 65–99)
POTASSIUM: 4.9 mmol/L (ref 3.5–5.1)
Potassium: 5.4 mmol/L — ABNORMAL HIGH (ref 3.5–5.1)
Sodium: 141 mmol/L (ref 135–145)
Sodium: 142 mmol/L (ref 135–145)

## 2017-05-30 LAB — CBC WITH DIFFERENTIAL/PLATELET
Basophils Absolute: 0 10*3/uL (ref 0–0.1)
Basophils Relative: 0 %
Eosinophils Absolute: 0.1 10*3/uL (ref 0–0.7)
Eosinophils Relative: 1 %
HEMATOCRIT: 23.4 % — AB (ref 40.0–52.0)
HEMOGLOBIN: 7.6 g/dL — AB (ref 13.0–18.0)
LYMPHS ABS: 2.4 10*3/uL (ref 1.0–3.6)
LYMPHS PCT: 23 %
MCH: 30.7 pg (ref 26.0–34.0)
MCHC: 32.3 g/dL (ref 32.0–36.0)
MCV: 95 fL (ref 80.0–100.0)
MONO ABS: 0.7 10*3/uL (ref 0.2–1.0)
MONOS PCT: 7 %
NEUTROS ABS: 7.2 10*3/uL — AB (ref 1.4–6.5)
NEUTROS PCT: 69 %
Platelets: 134 10*3/uL — ABNORMAL LOW (ref 150–440)
RBC: 2.47 MIL/uL — ABNORMAL LOW (ref 4.40–5.90)
RDW: 15.2 % — AB (ref 11.5–14.5)
WBC: 10.4 10*3/uL (ref 3.8–10.6)

## 2017-05-30 LAB — GLUCOSE, CAPILLARY
Glucose-Capillary: 157 mg/dL — ABNORMAL HIGH (ref 65–99)
Glucose-Capillary: 159 mg/dL — ABNORMAL HIGH (ref 65–99)
Glucose-Capillary: 133 mg/dL — ABNORMAL HIGH (ref 65–99)

## 2017-05-30 LAB — PROTIME-INR
INR: 1.93
Prothrombin Time: 22.3 seconds — ABNORMAL HIGH (ref 11.4–15.2)

## 2017-05-30 LAB — HEMOGLOBIN: HEMOGLOBIN: 8.3 g/dL — AB (ref 13.0–18.0)

## 2017-05-30 LAB — TSH: TSH: 4.633 u[IU]/mL — ABNORMAL HIGH (ref 0.350–4.500)

## 2017-05-30 LAB — CBC
HEMATOCRIT: 26.4 % — AB (ref 40.0–52.0)
HEMOGLOBIN: 8.7 g/dL — AB (ref 13.0–18.0)
MCH: 30.1 pg (ref 26.0–34.0)
MCHC: 32.9 g/dL (ref 32.0–36.0)
MCV: 91.6 fL (ref 80.0–100.0)
Platelets: 111 10*3/uL — ABNORMAL LOW (ref 150–440)
RBC: 2.89 MIL/uL — ABNORMAL LOW (ref 4.40–5.90)
RDW: 17.4 % — AB (ref 11.5–14.5)
WBC: 7.7 10*3/uL (ref 3.8–10.6)

## 2017-05-30 LAB — PREPARE RBC (CROSSMATCH)

## 2017-05-30 MED ORDER — VITAMIN D 1000 UNITS PO TABS: 2000.0000 [IU] | ORAL_TABLET | Freq: Every day | ORAL | Status: DC

## 2017-05-30 MED ORDER — DOLUTEGRAVIR SODIUM 50 MG PO TABS
50.0000 mg | ORAL_TABLET | Freq: Every day | ORAL | Status: DC
  Administered 2017-05-30: 50 mg via ORAL
  Filled 2017-05-30: qty 1

## 2017-05-30 MED ORDER — HEPARIN SODIUM (PORCINE) 5000 UNIT/ML IJ SOLN: 5000.0000 [IU] | Freq: Three times a day (TID) | INTRAMUSCULAR | Status: DC

## 2017-05-30 MED ORDER — INSULIN ASPART 100 UNIT/ML ~~LOC~~ SOLN
0.0000 [IU] | Freq: Three times a day (TID) | SUBCUTANEOUS | Status: DC
  Administered 2017-05-30: 10:00:00 2 [IU] via SUBCUTANEOUS
  Filled 2017-05-30: qty 1

## 2017-05-30 MED ORDER — LACTULOSE 10 GM/15ML PO SOLN: 20.0000 g | Freq: Two times a day (BID) | ORAL | Status: DC | PRN

## 2017-05-30 MED ORDER — MECLIZINE HCL 25 MG PO TABS
25.0000 mg | ORAL_TABLET | Freq: Three times a day (TID) | ORAL | Status: DC | PRN
  Filled 2017-05-30: qty 1

## 2017-05-30 MED ORDER — HYDROCODONE-ACETAMINOPHEN 10-325 MG PO TABS: 1.0000 | ORAL_TABLET | Freq: Four times a day (QID) | ORAL | Status: DC | PRN

## 2017-05-30 MED ORDER — DOLUTEGRAVIR SODIUM 50 MG PO TABS
50.0000 mg | ORAL_TABLET | Freq: Every day | ORAL | Status: DC
  Filled 2017-05-30: qty 1

## 2017-05-30 MED ORDER — BISACODYL 5 MG PO TBEC: 15.0000 mg | DELAYED_RELEASE_TABLET | Freq: Every day | ORAL | Status: DC | PRN

## 2017-05-30 MED ORDER — ISOSORBIDE MONONITRATE ER 30 MG PO TB24
15.0000 mg | ORAL_TABLET | ORAL | Status: DC
  Filled 2017-05-30: qty 1

## 2017-05-30 MED ORDER — TAB-A-VITE/IRON PO TABS: 1.0000 | ORAL_TABLET | Freq: Every day | ORAL | Status: DC

## 2017-05-30 MED ORDER — SODIUM CHLORIDE 0.9 % IV SOLN
INTRAVENOUS | Status: DC
  Administered 2017-05-30 (×2): via INTRAVENOUS

## 2017-05-30 MED ORDER — ALPRAZOLAM 0.5 MG PO TABS: 0.5000 mg | ORAL_TABLET | Freq: Two times a day (BID) | ORAL | Status: DC | PRN

## 2017-05-30 MED ORDER — TIMOLOL HEMIHYDRATE 0.5 % OP SOLN
1.0000 [drp] | Freq: Every day | OPHTHALMIC | Status: DC
  Filled 2017-05-30: qty 5

## 2017-05-30 MED ORDER — SODIUM BICARBONATE 650 MG PO TABS
1300.0000 mg | ORAL_TABLET | Freq: Two times a day (BID) | ORAL | Status: DC
  Filled 2017-05-30: qty 2

## 2017-05-30 MED ORDER — SODIUM BICARBONATE 650 MG PO TABS
1300.0000 mg | ORAL_TABLET | Freq: Two times a day (BID) | ORAL | Status: DC
  Administered 2017-05-30: 1300 mg via ORAL
  Filled 2017-05-30: qty 2

## 2017-05-30 MED ORDER — ACETAMINOPHEN 650 MG RE SUPP: 650.0000 mg | Freq: Four times a day (QID) | RECTAL | Status: DC | PRN

## 2017-05-30 MED ORDER — LAMIVUDINE 150 MG PO TABS
150.0000 mg | ORAL_TABLET | Freq: Every day | ORAL | Status: DC
  Administered 2017-05-30: 11:00:00 150 mg via ORAL

## 2017-05-30 MED ORDER — ORAL CARE MOUTH RINSE: 15.0000 mL | Freq: Two times a day (BID) | OROMUCOSAL | Status: DC

## 2017-05-30 MED ORDER — ENOXAPARIN SODIUM 40 MG/0.4ML ~~LOC~~ SOLN: 40.0000 mg | SUBCUTANEOUS | Status: DC

## 2017-05-30 MED ORDER — DILTIAZEM HCL ER COATED BEADS 240 MG PO CP24
240.0000 mg | ORAL_CAPSULE | Freq: Every day | ORAL | Status: DC
  Filled 2017-05-30: qty 1

## 2017-05-30 MED ORDER — ISOSORBIDE MONONITRATE ER 30 MG PO TB24: 15.0000 mg | ORAL_TABLET | ORAL | Status: DC

## 2017-05-30 MED ORDER — DILTIAZEM HCL ER COATED BEADS 240 MG PO CP24
240.0000 mg | ORAL_CAPSULE | Freq: Every day | ORAL | Status: DC
  Administered 2017-05-30: 11:00:00 240 mg via ORAL
  Filled 2017-05-30: qty 1

## 2017-05-30 MED ORDER — ONDANSETRON HCL 4 MG PO TABS: 4.0000 mg | ORAL_TABLET | Freq: Four times a day (QID) | ORAL | Status: DC | PRN

## 2017-05-30 MED ORDER — FUROSEMIDE 40 MG PO TABS: 80.0000 mg | ORAL_TABLET | Freq: Every day | ORAL | Status: DC | PRN

## 2017-05-30 MED ORDER — INSULIN ASPART 100 UNIT/ML ~~LOC~~ SOLN: 0.0000 [IU] | Freq: Every day | SUBCUTANEOUS | Status: DC

## 2017-05-30 MED ORDER — LAMIVUDINE 150 MG PO TABS
150.0000 mg | ORAL_TABLET | Freq: Every day | ORAL | Status: DC
  Filled 2017-05-30: qty 1

## 2017-05-30 MED ORDER — LATANOPROST 0.005 % OP SOLN
1.0000 [drp] | Freq: Every day | OPHTHALMIC | Status: DC
  Filled 2017-05-30: qty 2.5

## 2017-05-30 MED ORDER — SODIUM BICARBONATE 650 MG PO TABS
1300.0000 mg | ORAL_TABLET | Freq: Two times a day (BID) | ORAL | Status: DC
  Filled 2017-05-30 (×2): qty 2

## 2017-05-30 MED ORDER — ONDANSETRON HCL 4 MG/2ML IJ SOLN: 4.0000 mg | Freq: Four times a day (QID) | INTRAMUSCULAR | Status: DC | PRN

## 2017-05-30 MED ORDER — CLOPIDOGREL BISULFATE 75 MG PO TABS: 75.0000 mg | ORAL_TABLET | Freq: Every day | ORAL | Status: DC

## 2017-05-30 MED ORDER — SODIUM POLYSTYRENE SULFONATE 15 GM/60ML PO SUSP: 15.0000 g | ORAL | Status: DC

## 2017-05-30 MED ORDER — ACETAMINOPHEN 325 MG PO TABS: 650.0000 mg | ORAL_TABLET | Freq: Four times a day (QID) | ORAL | Status: DC | PRN

## 2017-05-30 MED ORDER — ENOXAPARIN SODIUM 30 MG/0.3ML ~~LOC~~ SOLN: 30.0000 mg | SUBCUTANEOUS | Status: DC

## 2017-05-30 MED ORDER — WARFARIN SODIUM 5 MG PO TABS: 5.0000 mg | ORAL_TABLET | Freq: Every day | ORAL | Status: DC

## 2017-05-30 MED ORDER — ADULT MULTIVITAMIN W/MINERALS CH: 1.0000 | ORAL_TABLET | Freq: Every day | ORAL | Status: DC

## 2017-05-30 MED ORDER — ISOSORBIDE MONONITRATE ER 60 MG PO TB24
30.0000 mg | ORAL_TABLET | ORAL | Status: DC
  Administered 2017-05-30: 11:00:00 30 mg via ORAL
  Filled 2017-05-30: qty 1

## 2017-05-30 NOTE — ED Notes (Signed)
Pt given crackers and gingerale. Andrews per MD

## 2017-05-30 NOTE — Progress Notes (Signed)
Patient welcomed into department by RN and Tech. Patients spouse who is also POA accompanied patient. Patient positioned into a position of stated comfort. Patient brought home medications with him and states "i will only take medication I brought with me or that is approved by insurance." this nurse states understanding to patients request. Patient also refused to allow this nurse to assess bilateral lower extremities because of his special socks, and refused this nurse to place yellow griping socks over his personal socks. This nurse explained the risks of him not having proper traction and his status of a high fall risk, patient bluntly states understanding. Patient also refused this nurse to place SCD's on bilateral lower extremities, and this nurse explained the risks and benefits and patient states understanding.

## 2017-05-30 NOTE — Progress Notes (Signed)
Pt is being discharged home. Discharge papers given and explained to pt and spouse, both verbalized understanding. Meds and f/u appointments reviewed with pt. No RX at this time. Pt's own meds returned to pt.

## 2017-05-30 NOTE — Care Management Note (Signed)
Case Management Note  Patient Details  Name: RODEL GLASPY MRN: [id number] Date of Birth: 10-06-1941  Subjective/Objective:        Mr Testa uses 02 at night. New order for water bottles with already existing 02 called to Melene Muller at [facility].             Action/Plan:   Expected Discharge Date:                  Expected Discharge Plan:     In-House Referral:     Discharge planning Services     Post Acute Care Choice:    Choice offered to:     DME Arranged:    DME Agency:     HH Arranged:    HH Agency:     Status of Service:     If discussed at [employer] of Stay Meetings, dates discussed:    Additional Comments:  Stepanie Graver A, RN 05/30/2017, 11:05 AM

## 2017-05-30 NOTE — H&P (Signed)
Robert Bentley is an 76 y.o. male.   Chief Complaint: Nosebleed HPI: The patient with past medical history of CAD, CHF, diabetes, atrial fibrillation, HIV and hepatitis C presents to the emergency department complaining of a nosebleed. The patient states that it is lasted for 9 hours. He is on multiple anticoagulants. In the emergency department his left naris was packed with thrombin impregnated gauze. Laboratory evaluation revealed significantly decreased H&H and the patient was found to be orthostatic which prompted the emergency department staff to call hospitalist's for admission.  Past Medical History:  Diagnosis Date  . Anxiety   . Atrial fibrillation ([place])   . CHF (congestive heart failure) ([place])   . Coronary artery disease   . Diabetes mellitus without complication ([place])   . Hepatitis C   . HIV disease ([place])   . Hypertension   . PTSD (post-traumatic stress disorder)   . Renal failure     Past Surgical History:  Procedure Laterality Date  . APPENDECTOMY    . cardiac stenting    . CHOLECYSTECTOMY    . EP IMPLANTABLE DEVICE    . FRACTURE SURGERY      Family History  Problem Relation Age of Onset  . Heart failure Mother   . Diabetes Mother    Social History:  reports that he has never smoked. He has never used smokeless tobacco. He reports that he does not drink alcohol or use drugs.  Allergies:  Allergies  Allergen Reactions  . Morphine Nausea And Vomiting, Rash and Other (See Comments)    Reactions: "gets violent"  . Efavirenz Other (See Comments)    Reaction:  Unknown   . Statins Hives  . Sulfamethizole Other (See Comments)    fever   . Morphine And Related Nausea And Vomiting and Rash    Medications Prior to Admission  Medication Sig Dispense Refill  . ALPRAZolam (XANAX) 0.5 MG tablet Take 0.5 mg by mouth 2 (two) times daily as needed for anxiety.    . bisacodyl (DULCOLAX) 5 MG EC tablet Take 15 mg by mouth daily as needed for moderate constipation.    .  Cholecalciferol (VITAMIN D3) 2000 UNITS TABS Take 1 tablet by mouth daily.    . clopidogrel (PLAVIX) 75 MG tablet Take 75 mg by mouth daily.    Marland Kitchen diltiazem (DILACOR XR) 240 MG 24 hr capsule Take 240 mg by mouth daily.    . dolutegravir (TIVICAY) 50 MG tablet Take 50 mg by mouth daily.    . furosemide (LASIX) 80 MG tablet Take 80 mg by mouth daily as needed for fluid or edema.     Marland Kitchen glimepiride (AMARYL) 2 MG tablet Take 2 mg by mouth daily as needed (for high blood sugar).    Marland Kitchen HYDROcodone-acetaminophen (NORCO) 10-325 MG per tablet Take 1 tablet by mouth every 6 (six) hours as needed for severe pain.    . isosorbide mononitrate (IMDUR) 30 MG 24 hr tablet Take 15 mg by mouth every morning.     . lactulose (CHRONULAC) 10 GM/15ML solution Take 20 g by mouth 2 (two) times daily as needed for mild constipation.    Marland Kitchen lamiVUDine (EPIVIR) 150 MG tablet Take 150 mg by mouth daily.    Marland Kitchen latanoprost (XALATAN) 0.005 % ophthalmic solution INSTILL ONE DROP TO BOTH EYES EVERY NIGHT  6  . meclizine (ANTIVERT) 25 MG tablet Take 1 tablet (25 mg total) by mouth 3 (three) times daily as needed for dizziness. 30 tablet 0  .  Multiple Vitamins-Iron (MULTIVITAMINS WITH IRON) TABS tablet Take 1 tablet by mouth daily.    . ondansetron (ZOFRAN) 8 MG tablet Take 8 mg by mouth every 8 (eight) hours as needed for nausea or vomiting.    . sodium bicarbonate 650 MG tablet Take 1,300 mg by mouth 2 (two) times daily.     . sodium polystyrene (KAYEXALATE) 15 GM/60ML suspension Take 15 g by mouth 3 (three) times a week.    . timolol (BETIMOL) 0.5 % ophthalmic solution Place 1 drop into both eyes at bedtime.    Marland Kitchen warfarin (COUMADIN) 5 MG tablet Take 5 mg by mouth daily.       Results for orders placed or performed during the hospital encounter of 05/29/17 (from the past 48 hour(s))  CBC with Differential     Status: Abnormal   Collection Time: 05/29/17 11:46 PM  Result Value Ref Range   WBC 10.4 3.8 - 10.6 K/uL   RBC 2.47 (L)  4.40 - 5.90 MIL/uL   Hemoglobin 7.6 (L) 13.0 - 18.0 g/dL   HCT 23.4 (L) 40.0 - 52.0 %   MCV 95.0 80.0 - 100.0 fL   MCH 30.7 26.0 - 34.0 pg   MCHC 32.3 32.0 - 36.0 g/dL   RDW 15.2 (H) 11.5 - 14.5 %   Platelets 134 (L) 150 - 440 K/uL   Neutrophils Relative % 69 %   Neutro Abs 7.2 (H) 1.4 - 6.5 K/uL   Lymphocytes Relative 23 %   Lymphs Abs 2.4 1.0 - 3.6 K/uL   Monocytes Relative 7 %   Monocytes Absolute 0.7 0.2 - 1.0 K/uL   Eosinophils Relative 1 %   Eosinophils Absolute 0.1 0 - 0.7 K/uL   Basophils Relative 0 %   Basophils Absolute 0.0 0 - 0.1 K/uL  Basic metabolic panel     Status: Abnormal   Collection Time: 05/29/17 11:46 PM  Result Value Ref Range   Sodium 141 135 - 145 mmol/L   Potassium 5.4 (H) 3.5 - 5.1 mmol/L   Chloride 106 101 - 111 mmol/L   CO2 26 22 - 32 mmol/L   Glucose, Bld 242 (H) 65 - 99 mg/dL   BUN 85 (H) 6 - 20 mg/dL   Creatinine, Ser 3.70 (H) 0.61 - 1.24 mg/dL   Calcium 8.3 (L) 8.9 - 10.3 mg/dL   GFR calc non Af Amer 15 (L) >60 mL/min   GFR calc Af Amer 17 (L) >60 mL/min    Comment: (NOTE) The eGFR has been calculated using the CKD EPI equation. This calculation has not been validated in all clinical situations. eGFR's persistently <60 mL/min signify possible Chronic Kidney Disease.    Anion gap 9 5 - 15  Protime-INR     Status: Abnormal   Collection Time: 05/29/17 11:46 PM  Result Value Ref Range   Prothrombin Time 22.3 (H) 11.4 - 15.2 seconds   INR 1.93   ABO/Rh     Status: None   Collection Time: 05/29/17 11:46 PM  Result Value Ref Range   ABO/RH(D) O POS   Prepare RBC     Status: None   Collection Time: 05/30/17 12:29 AM  Result Value Ref Range   Order Confirmation ORDER PROCESSED BY BLOOD BANK   Type and screen     Status: None (Preliminary result)   Collection Time: 05/30/17 12:29 AM  Result Value Ref Range   ABO/RH(D) O POS    Antibody Screen NEG    Sample Expiration 06/02/2017  Unit Number [id number]    Blood Component Type RCLI  PHER 2    Unit division 00    Status of Unit ISSUED    Transfusion Status OK TO TRANSFUSE    Crossmatch Result Compatible    Unit Number [id number]    Blood Component Type RCLI PHER 2    Unit division 00    Status of Unit ALLOCATED    Transfusion Status OK TO TRANSFUSE    Crossmatch Result Compatible    No results found.  Review of Systems  Constitutional: Negative for chills and fever.  HENT: Positive for nosebleeds. Negative for sore throat and tinnitus.   Eyes: Negative for blurred vision and redness.  Respiratory: Negative for cough and shortness of breath.   Cardiovascular: Negative for chest pain, palpitations, orthopnea and PND.  Gastrointestinal: Negative for abdominal pain, diarrhea, nausea and vomiting.  Genitourinary: Negative for dysuria, frequency and urgency.  Musculoskeletal: Negative for joint pain and myalgias.  Skin: Negative for rash.       No lesions  Neurological: Negative for speech change, focal weakness and weakness.  Endo/Heme/Allergies: Does not bruise/bleed easily.       No temperature intolerance  Psychiatric/Behavioral: Negative for depression and suicidal ideas.    Blood pressure (!) 122/53, pulse 63, temperature 98.5 F (36.9 C), temperature source Axillary, resp. rate 16, height '6\' 1"'  (1.854 m), weight 79.8 kg (175 lb 14.4 oz), SpO2 98 %. Physical Exam  Vitals reviewed. Constitutional: He is oriented to person, place, and time. He appears well-developed and well-nourished. No distress.  HENT:  Head: Normocephalic and atraumatic.  Nose: Epistaxis is observed.    Mouth/Throat: Oropharynx is clear and moist.  Eyes: Pupils are equal, round, and reactive to light. Conjunctivae and EOM are normal. No scleral icterus.  Neck: Normal range of motion. Neck supple. No JVD present. No tracheal deviation present. No thyromegaly present.  Cardiovascular: Normal rate and regular rhythm.  Exam reveals no gallop and no friction rub.   No murmur  heard. Respiratory: Effort normal and breath sounds normal. No respiratory distress.  GI: Soft. Bowel sounds are normal. He exhibits no distension. There is no tenderness.  Genitourinary:  Genitourinary Comments: Deferred  Musculoskeletal: Normal range of motion. He exhibits no edema.  Lymphadenopathy:    He has no cervical adenopathy.  Neurological: He is alert and oriented to person, place, and time. No cranial nerve deficit.  Skin: Skin is warm and dry. No rash noted. No erythema.  Psychiatric: He has a normal mood and affect. His behavior is normal. Judgment and thought content normal.     Assessment/Plan This is a 76 year old male admitted for symptomatic anemia. 1. Anemia: 4 g drop in hemoglobin. Heart rate and blood pressure normal at rest however the patient is still orthostatic. 2 units packed red blood cells ordered for transfusion. 2. Epistaxis: Continue to pack left nares. 3. A. fib: Chronic; rate controlled. Continue warfarin. Diltiazem for rate and rhythm control. 4. CKD: Stage IV; hydrate with intravenous fluid. Avoid nephrotoxic agents. Kayexalate for chronic hyperkalemia 5. CAD: Stable; continue Plavix and Imdur 6. CHF: Stable; Lasix per home regimen 7. Diabetes mellitus type 2: Sliding scale insulin while hospitalized. 8. HIV: Continue anti-nucleoside, nucleotide and protease inhibitor. 9. DVT prophylaxis: Lovenox 10. GI prophylaxis: None The patient is full code. Time spent on admission orders and patient care approximately 45 minutes  Harrie Foreman, MD 05/30/2017, 2:59 AM

## 2017-05-30 NOTE — Progress Notes (Signed)
Patients home medications inventoried with patients spouse and taken to pharmacy. Patient states again, "i will only take my home medications."

## 2017-05-31 ENCOUNTER — Telehealth: Payer: Self-pay | Admitting: Emergency Medicine

## 2017-05-31 LAB — TYPE AND SCREEN
ABO/RH(D): O POS
Antibody Screen: NEGATIVE
Unit division: 0
Unit division: 0

## 2017-05-31 LAB — BPAM RBC
Blood Product Expiration Date: 201808172359
Blood Product Expiration Date: 201808172359
ISSUE DATE / TIME: 201808110242
ISSUE DATE / TIME: 201808110500
Unit Type and Rh: 9500
Unit Type and Rh: 9500

## 2017-05-31 NOTE — Discharge Summary (Signed)
[facility] NAME: Robert Bentley    MR#:  [id number]  DATE OF BIRTH:  05-Jan-1941  DATE OF ADMISSION:  05/29/2017   ADMITTING PHYSICIAN: Robert Foreman, MD  DATE OF DISCHARGE: 05/30/2017  3:58 PM  PRIMARY CARE PHYSICIAN: Robert Athens, MD   ADMISSION DIAGNOSIS:   Hemorrhage [R58] Left-sided epistaxis [R04.0] Anemia, unspecified type [D64.9] Acute renal failure superimposed on chronic kidney disease, unspecified CKD stage, unspecified acute renal failure type ([place]) [N17.9, N18.9]  DISCHARGE DIAGNOSIS:   Active Problems:   Symptomatic anemia   SECONDARY DIAGNOSIS:   Past Medical History:  Diagnosis Date  . Anxiety   . Atrial fibrillation ([place])   . CHF (congestive heart failure) ([place])   . Coronary artery disease   . Diabetes mellitus without complication ([place])   . Hepatitis C   . HIV disease ([place])   . Hypertension   . PTSD (post-traumatic stress disorder)   . Renal failure     HOSPITAL COURSE:   76 year old male with past medical history significant for CAD, ischemic cardiomyopathy with EF of 25% status post ICD, diabetes mellitus, atrial fibrillation on Coumadin, HIV, hepatitis C, posttraumatic stress disorder presents to the hospital secondary to epistaxis.  #1 epistaxis-secondary to dryness in the nasal passage from using oxygen with pulse humidifier. -Bleeding started 3 days prior to admission, patient had bleeding for several hours and it stopped spontaneously.  -prior to admission he was taking off the scab and it started bleeding again -Hemoglobin dropped from 9 at baseline to 7.6. Patient received 2 units of transfusion and its stable at this time. -Has packing in his left naris and ENT follow-up in 3-4 days. Advised if the packing in until seen by ENT. - Merocel packing in left nare, antibiotics keflex was called in by ER physician while he has the packing. -Advised to hold his Plavix and Coumadin at  discharge  #2 atrial fibrillation-rate controlled. On Cardizem. -Has issues with bradycardia as monitored on Holter monitor and cardiology team has recommended pacemaker as outpatient. Patient is reconsidering his options at this time. -Warfarin has been held at this time due to epistaxis and acute anemia requiring transfusion. -We'll follow up with his cardiology team in the next week about resumption of medications if symptoms improve  #3 ischemic cardiomyopathy-low ejection fraction, status post ICD. Continue all cardiac medications. Well compensated at this time.  #4 HIV and hepatitis C-continue to follow up as outpatient. Continue home medications  #5 CK D stage IV-recently progressing, follows with nephrology as outpatient. GFR is about the same as recent GFR as outpatient at around 16  #6 CAD-status post stent in 2003, repeat cardiac catheterization in 2012 did not reveal any significant blockages. Plavix will be held at this time given his recent epistaxis and acute anemia requiring transfusion. Follow-up with cardiology as outpatient.  Patient is very anxious to go home. He said he will even sign out [place] if not discharged today. He will be discharged today with close outpatient follow-up recommendations    DISCHARGE CONDITIONS:   Guarded  CONSULTS OBTAINED:   None  DRUG ALLERGIES:   Allergies  Allergen Reactions  . Morphine Nausea And Vomiting, Rash and Other (See Comments)    Reactions: "gets violent"  . Efavirenz Other (See Comments)    Reaction:  Unknown   . Statins Hives  . Sulfamethizole Other (See Comments)    fever   . Morphine And Related Nausea  And Vomiting and Rash   DISCHARGE MEDICATIONS:   Allergies as of 05/30/2017      Reactions   Morphine Nausea And Vomiting, Rash, Other (See Comments)   Reactions: "gets violent"   Efavirenz Other (See Comments)   Reaction:  Unknown    Statins Hives   Sulfamethizole Other (See Comments)    fever   Morphine And Related Nausea And Vomiting, Rash      Medication List    STOP taking these medications   clopidogrel 75 MG tablet Commonly known as:  PLAVIX   warfarin 5 MG tablet Commonly known as:  COUMADIN     TAKE these medications   ALPRAZolam 0.5 MG tablet Commonly known as:  XANAX Take 0.5 mg by mouth 2 (two) times daily as needed for anxiety.   bisacodyl 5 MG EC tablet Commonly known as:  DULCOLAX Take 15 mg by mouth daily as needed for moderate constipation.   diltiazem 240 MG 24 hr capsule Commonly known as:  DILACOR XR Take 240 mg by mouth daily.   furosemide 80 MG tablet Commonly known as:  LASIX Take 80 mg by mouth daily as needed for fluid or edema.   glimepiride 2 MG tablet Commonly known as:  AMARYL Take 2 mg by mouth daily as needed (for high blood sugar).   HYDROcodone-acetaminophen 10-325 MG tablet Commonly known as:  NORCO Take 1 tablet by mouth every 6 (six) hours as needed for severe pain.   isosorbide mononitrate 30 MG 24 hr tablet Commonly known as:  IMDUR Take 15 mg by mouth every morning.   lactulose 10 GM/15ML solution Commonly known as:  CHRONULAC Take 20 g by mouth 2 (two) times daily as needed for mild constipation.   lamiVUDine 150 MG tablet Commonly known as:  EPIVIR Take 150 mg by mouth daily.   latanoprost 0.005 % ophthalmic solution Commonly known as:  XALATAN INSTILL ONE DROP TO BOTH EYES EVERY NIGHT   meclizine 25 MG tablet Commonly known as:  ANTIVERT Take 1 tablet (25 mg total) by mouth 3 (three) times daily as needed for dizziness.   multivitamins with iron Tabs tablet Take 1 tablet by mouth daily.   ondansetron 8 MG tablet Commonly known as:  ZOFRAN Take 8 mg by mouth every 8 (eight) hours as needed for nausea or vomiting.   sodium bicarbonate 650 MG tablet Take 1,300 mg by mouth 2 (two) times daily.   sodium polystyrene 15 GM/60ML suspension Commonly known as:  KAYEXALATE Take 15 g by mouth 3  (three) times a week.   timolol 0.5 % ophthalmic solution Commonly known as:  BETIMOL Place 1 drop into both eyes at bedtime.   TIVICAY 50 MG tablet Generic drug:  dolutegravir Take 50 mg by mouth daily.   Vitamin D3 2000 units Tabs Take 1 tablet by mouth daily.        DISCHARGE INSTRUCTIONS:   1. PCP f/u in 1-2 weeks 2. Cardiology f/u in 2 weeks 3. ENT f/u in 3 days  DIET:   Cardiac diet  ACTIVITY:   Activity as tolerated  OXYGEN:   Home Oxygen: Yes.    Oxygen Delivery: 2 liters/min via Patient connected to nasal cannula oxygen  DISCHARGE LOCATION:   home   If you experience worsening of your admission symptoms, develop shortness of breath, life threatening emergency, suicidal or homicidal thoughts you must seek medical attention immediately by calling 911 or calling your MD immediately  if symptoms less severe.  You Must  read complete instructions/literature along with all the possible adverse reactions/side effects for all the Medicines you take and that have been prescribed to you. Take any new Medicines after you have completely understood and accpet all the possible adverse reactions/side effects.   Please note  You were cared for by a hospitalist during your hospital stay. If you have any questions about your discharge medications or the care you received while you were in the hospital after you are discharged, you can call the unit and asked to speak with the hospitalist on call if the hospitalist that took care of you is not available. Once you are discharged, your primary care physician will handle any further medical issues. Please note that NO REFILLS for any discharge medications will be authorized once you are discharged, as it is imperative that you return to your primary care physician (or establish a relationship with a primary care physician if you do not have one) for your aftercare needs so that they can reassess your need for medications and monitor  your lab values.    On the day of Discharge:  VITAL SIGNS:   Blood pressure 140/72, pulse 60, temperature 98.6 F (37 C), temperature source Oral, resp. rate 18, height 6\' 1"  (1.854 m), weight 79.8 kg (175 lb 14.4 oz), SpO2 98 %.  PHYSICAL EXAMINATION:    GENERAL:  76 y.o.-year-old patient lying in the bed with no acute distress.  EYES: Pupils equal, round, reactive to light and accommodation. No scleral icterus. Extraocular muscles intact. Pale Conjunctivae HEENT: Head atraumatic, normocephalic. Oropharynx and nasopharynx clear.  NECK:  Supple, no jugular venous distention. No thyroid enlargement, no tenderness.  LUNGS: Normal breath sounds bilaterally, no wheezing, rales,rhonchi or crepitation. No use of accessory muscles of respiration. Fine bibasilar crackles. CARDIOVASCULAR: S1, S2 normal. No rubs, or gallops. 2/6 systolic murmur is present ABDOMEN: Soft, non-tender, non-distended. Bowel sounds present. No organomegaly or mass.  EXTREMITIES: No pedal edema, cyanosis, or clubbing.  NEUROLOGIC: Cranial nerves II through XII are intact. Muscle strength 5/5 in all extremities. Sensation intact. Gait not checked.  PSYCHIATRIC: The patient is alert and oriented x 3.  SKIN: No obvious rash, lesion, or ulcer.   DATA REVIEW:   CBC  Recent Labs Lab 05/30/17 0839 05/30/17 1320  WBC 7.7  --   HGB 8.7* 8.3*  HCT 26.4*  --   PLT 111*  --     Chemistries   Recent Labs Lab 05/30/17 0839  NA 142  K 4.9  CL 107  CO2 27  GLUCOSE 144*  BUN 94*  CREATININE 3.44*  CALCIUM 8.5*     Microbiology Results  Results for orders placed or performed during the hospital encounter of 05/23/16  Urine culture     Status: None   Collection Time: 05/23/16  1:41 PM  Result Value Ref Range Status   Specimen Description URINE, RANDOM  Final   Special Requests NONE  Final   Culture NO GROWTH Performed at [facility]   Final   Report Status 05/24/2016 FINAL  Final  CULTURE, BLOOD  (ROUTINE X 2) w Reflex to ID Panel     Status: None   Collection Time: 05/23/16  2:28 PM  Result Value Ref Range Status   Specimen Description BLOOD RIGHT ANTECUBITAL  Final   Special Requests BOTTLES DRAWN AEROBIC AND ANAEROBIC 4CC  Final   Culture NO GROWTH 5 DAYS  Final   Report Status 05/28/2016 FINAL  Final  CULTURE, BLOOD (ROUTINE X  2) w Reflex to ID Panel     Status: None   Collection Time: 05/23/16  2:28 PM  Result Value Ref Range Status   Specimen Description BLOOD LEFT FOREARM  Final   Special Requests BOTTLES DRAWN AEROBIC AND ANAEROBIC 3CC  Final   Culture NO GROWTH 5 DAYS  Final   Report Status 05/28/2016 FINAL  Final    RADIOLOGY:  No results found.   Management plans discussed with the patient, family and they are in agreement.  CODE STATUS:  Code Status History    Date Active Date Inactive Code Status Order ID Comments User Context   05/30/2017  2:04 AM 05/30/2017  7:03 PM Full Code [id number]  Robert Foreman, MD Inpatient   05/23/2016  1:21 PM 05/24/2016  4:51 PM Full Code [id number]  Hillary Bow, MD ED   04/10/2015 10:16 PM 04/12/2015  1:31 PM Full Code [id number]  Hower, Aaron Mose, MD ED      TOTAL TIME TAKING CARE OF THIS PATIENT: 37 minutes.    Gladstone Lighter M.D on 05/31/2017 at 11:27 AM  Between 7am to 6pm - Pager - [phone]  After 6pm go to [email or web address] - Proofreader  [facility]  Office  [phone]  CC: Primary care physician; Robert Athens, MD   Note: This dictation was prepared with Dragon dictation along with smaller phrase technology. Any transcriptional errors that result from this process are unintentional.

## 2017-05-31 NOTE — Telephone Encounter (Signed)
Per Dr.Sung , pt needs to be called and add a RX to his treatment plan. Keflex 500mg  TID PO x7 days, 0 refills. Called pt and spoke to his wife, pt currently resting.  Wife request that we call [facility] in [place] [phone],  Called RX into [place].

## 2017-05-31 NOTE — ED Provider Notes (Signed)
-----------------------------------------   12:09 AM on 05/31/2017 -----------------------------------------  Patient was seen by me last evening and admitted for epistaxis causing anemia requiring transfusion. Chart review reveals patient was not discharged on antibiotics for Merocel packing in his left nares. Will have daytime charge nurse phone patient in the morning to update him as well as call in a prescription to patient's pharmacy for Keflex 500 mg 3 times daily 7 days.   Paulette Blanch, MD 05/31/17 [phone]

## 2017-06-01 DIAGNOSIS — I480 Paroxysmal atrial fibrillation: Secondary | ICD-10-CM | POA: Diagnosis not present

## 2017-06-01 DIAGNOSIS — I2510 Atherosclerotic heart disease of native coronary artery without angina pectoris: Secondary | ICD-10-CM | POA: Diagnosis not present

## 2017-06-01 DIAGNOSIS — I493 Ventricular premature depolarization: Secondary | ICD-10-CM | POA: Diagnosis not present

## 2017-06-01 DIAGNOSIS — I5022 Chronic systolic (congestive) heart failure: Secondary | ICD-10-CM | POA: Diagnosis not present

## 2017-06-01 DIAGNOSIS — E7800 Pure hypercholesterolemia, unspecified: Secondary | ICD-10-CM | POA: Diagnosis not present

## 2017-06-01 DIAGNOSIS — I429 Cardiomyopathy, unspecified: Secondary | ICD-10-CM | POA: Diagnosis not present

## 2017-06-01 DIAGNOSIS — I10 Essential (primary) hypertension: Secondary | ICD-10-CM | POA: Diagnosis not present

## 2017-06-01 DIAGNOSIS — R0602 Shortness of breath: Secondary | ICD-10-CM | POA: Diagnosis not present

## 2017-06-01 DIAGNOSIS — I255 Ischemic cardiomyopathy: Secondary | ICD-10-CM | POA: Diagnosis not present

## 2017-06-01 DIAGNOSIS — Z955 Presence of coronary angioplasty implant and graft: Secondary | ICD-10-CM | POA: Diagnosis not present

## 2017-06-01 DIAGNOSIS — F4310 Post-traumatic stress disorder, unspecified: Secondary | ICD-10-CM | POA: Diagnosis not present

## 2017-06-01 DIAGNOSIS — E1122 Type 2 diabetes mellitus with diabetic chronic kidney disease: Secondary | ICD-10-CM | POA: Diagnosis not present

## 2017-06-01 LAB — MISC LABCORP TEST (SEND OUT): Labcorp test code: 1453

## 2017-06-02 ENCOUNTER — Emergency Department
Admission: EM | Admit: 2017-06-02 | Discharge: 2017-06-02 | Disposition: A | Discharge status: Home / Self Care | Payer: Medicare Other | Attending: Emergency Medicine | Admitting: Emergency Medicine

## 2017-06-02 ENCOUNTER — Encounter: Payer: Self-pay | Admitting: *Deleted

## 2017-06-02 DIAGNOSIS — I2510 Atherosclerotic heart disease of native coronary artery without angina pectoris: Secondary | ICD-10-CM | POA: Diagnosis not present

## 2017-06-02 DIAGNOSIS — R519 Headache, unspecified: Secondary | ICD-10-CM

## 2017-06-02 DIAGNOSIS — I110 Hypertensive heart disease with heart failure: Secondary | ICD-10-CM | POA: Diagnosis not present

## 2017-06-02 DIAGNOSIS — E119 Type 2 diabetes mellitus without complications: Secondary | ICD-10-CM | POA: Diagnosis not present

## 2017-06-02 DIAGNOSIS — I509 Heart failure, unspecified: Secondary | ICD-10-CM | POA: Insufficient documentation

## 2017-06-02 DIAGNOSIS — B20 Human immunodeficiency virus [HIV] disease: Secondary | ICD-10-CM | POA: Diagnosis not present

## 2017-06-02 DIAGNOSIS — Z79899 Other long term (current) drug therapy: Secondary | ICD-10-CM | POA: Insufficient documentation

## 2017-06-02 DIAGNOSIS — G894 Chronic pain syndrome: Secondary | ICD-10-CM | POA: Insufficient documentation

## 2017-06-02 DIAGNOSIS — Z7984 Long term (current) use of oral hypoglycemic drugs: Secondary | ICD-10-CM | POA: Diagnosis not present

## 2017-06-02 DIAGNOSIS — B1710 Acute hepatitis C without hepatic coma: Secondary | ICD-10-CM | POA: Diagnosis not present

## 2017-06-02 DIAGNOSIS — G501 Atypical facial pain: Secondary | ICD-10-CM | POA: Insufficient documentation

## 2017-06-02 DIAGNOSIS — G8929 Other chronic pain: Secondary | ICD-10-CM | POA: Diagnosis not present

## 2017-06-02 DIAGNOSIS — I4891 Unspecified atrial fibrillation: Secondary | ICD-10-CM | POA: Diagnosis not present

## 2017-06-02 DIAGNOSIS — R51 Headache: Secondary | ICD-10-CM | POA: Diagnosis not present

## 2017-06-02 DIAGNOSIS — Z760 Encounter for issue of repeat prescription: Secondary | ICD-10-CM | POA: Diagnosis present

## 2017-06-02 NOTE — ED Triage Notes (Signed)
Pt to triage via wheelchair.  Pt has a nasal tampon in left nares placed by dr sung 5 days ago. Pt here today for pain meds. Pt requesting pain meds for facial pain. Pt alert, speech clear, pt pale.

## 2017-06-02 NOTE — ED Provider Notes (Signed)
[facility] Emergency Department Provider Note  ____________________________________________  Time seen: Approximately 5:48 PM  I have reviewed the triage vital signs and the nursing notes.   HISTORY  Chief Complaint Facial Pain    HPI Robert Bentley is a 76 y.o. male comes to the ED requesting Vicodin 10/325 tablets due to ongoing left-sided facial pain after having a packing for epistaxis in the ED 4 days ago. He did fill a prescription for 60 vicodin 10/325 tablets 5 days ago that were given to him by his doctor for his chronic pain, but he is worried that he will run out because of the acute facial pain causing increased use of his Vicodin and would like an additional prescription now in advance of the he doesn't run out before next month when his doctor would normally refill the Vicodin. Denies any acutely worsening symptoms with the facial pain. The packing is in place. No fevers or chills. He is taking the antibiotics given to him as prophylaxis. No neck stiffness vision changes headaches dizziness or syncope. He has an appointment with ENT in the next 2 days. He saw his cardiologist yesterday.     Past Medical History:  Diagnosis Date  . Anxiety   . Atrial fibrillation ([place])   . CHF (congestive heart failure) ([place])   . Coronary artery disease   . Diabetes mellitus without complication ([place])   . Hepatitis C   . HIV disease ([place])   . Hypertension   . PTSD (post-traumatic stress disorder)   . Renal failure      Patient Active Problem List   Diagnosis Date Noted  . Symptomatic anemia 05/30/2017  . Weakness 05/23/2016  . Anemia in chronic renal disease 05/03/2015  . UTI (urinary tract infection) 04/12/2015  . Subtherapeutic international normalized ratio (INR) 04/12/2015  . Infection of urinary tract 04/12/2015  . Chest pain 04/10/2015  . Acute kidney injury ([place]) 04/10/2015     Past Surgical History:  Procedure Laterality Date  .  APPENDECTOMY    . cardiac stenting    . CHOLECYSTECTOMY    . EP IMPLANTABLE DEVICE    . FRACTURE SURGERY       Prior to Admission medications   Medication Sig Start Date End Date Taking? Authorizing Provider  ALPRAZolam Duanne Moron) 0.5 MG tablet Take 0.5 mg by mouth 2 (two) times daily as needed for anxiety.    [provider]  bisacodyl (DULCOLAX) 5 MG EC tablet Take 15 mg by mouth daily as needed for moderate constipation.    [provider]  Cholecalciferol (VITAMIN D3) 2000 UNITS TABS Take 1 tablet by mouth daily.    [provider]  diltiazem (DILACOR XR) 240 MG 24 hr capsule Take 240 mg by mouth daily.    [provider]  dolutegravir (TIVICAY) 50 MG tablet Take 50 mg by mouth daily.    [provider]  furosemide (LASIX) 80 MG tablet Take 80 mg by mouth daily as needed for fluid or edema.     [provider]  glimepiride (AMARYL) 2 MG tablet Take 2 mg by mouth daily as needed (for high blood sugar).    [provider]  HYDROcodone-acetaminophen (NORCO) 10-325 MG per tablet Take 1 tablet by mouth every 6 (six) hours as needed for severe pain.    [provider]  isosorbide mononitrate (IMDUR) 30 MG 24 hr tablet Take 15 mg by mouth every morning.     [provider]  lactulose (  CHRONULAC) 10 GM/15ML solution Take 20 g by mouth 2 (two) times daily as needed for mild constipation.    [provider]  lamiVUDine (EPIVIR) 150 MG tablet Take 150 mg by mouth daily.    [provider]  latanoprost (XALATAN) 0.005 % ophthalmic solution INSTILL ONE DROP TO BOTH EYES EVERY NIGHT 04/20/15   [provider]  meclizine (ANTIVERT) 25 MG tablet Take 1 tablet (25 mg total) by mouth 3 (three) times daily as needed for dizziness. 08/01/16   Nance Pear, MD  Multiple Vitamins-Iron (MULTIVITAMINS WITH IRON) TABS tablet Take 1 tablet by mouth daily.    [provider]  ondansetron (ZOFRAN) 8 MG  tablet Take 8 mg by mouth every 8 (eight) hours as needed for nausea or vomiting.    [provider]  sodium bicarbonate 650 MG tablet Take 1,300 mg by mouth 2 (two) times daily.     [provider]  sodium polystyrene (KAYEXALATE) 15 GM/60ML suspension Take 15 g by mouth 3 (three) times a week.    [provider]  timolol (BETIMOL) 0.5 % ophthalmic solution Place 1 drop into both eyes at bedtime.    [provider]     Allergies Morphine; Efavirenz; Statins; Sulfamethizole; and Morphine and related   Family History  Problem Relation Age of Onset  . Heart failure Mother   . Diabetes Mother     Social History Social History  Substance Use Topics  . Smoking status: Never Smoker  . Smokeless tobacco: Never Used  . Alcohol use No    Review of Systems  Constitutional:   No fever or chills.  ENT:   No sore throat. Packing in place left nostril. Facial pain. No drainage. No bleeding Cardiovascular:   No chest pain or syncope. Respiratory:   No dyspnea or cough. Gastrointestinal:   Negative for abdominal pain, vomiting and diarrhea.   All other systems reviewed and are negative except as documented above in ROS and HPI.  ____________________________________________   PHYSICAL EXAM:  VITAL SIGNS: ED Triage Vitals  Enc Vitals Group     BP 06/02/17 1625 (!) 151/64     Pulse Rate 06/02/17 1625 72     Resp 06/02/17 1625 20     Temp 06/02/17 1625 98.3 F (36.8 C)     Temp Source 06/02/17 1625 Oral     SpO2 06/02/17 1625 99 %     Weight 06/02/17 1627 175 lb (79.4 kg)     Height 06/02/17 1627 6\' 1"  (1.854 m)     Head Circumference --      Peak Flow --      Pain Score 06/02/17 1625 10     Pain Loc --      Pain Edu? --      Excl. in [place]? --     Vital signs reviewed, nursing assessments reviewed.   Constitutional:   Alert and oriented. Well appearing and in no distress. Eyes:   No scleral icterus.  EOMI. No nystagmus. ENT   Head:    Normocephalic and atraumatic.Normal TMs and external canals   Nose:   Left naris with Merocel packing in place. No bleeding or discharge. Right side without congestion/rhinnorhea.    Mouth/Throat:   MMM, no pharyngeal erythema. No peritonsillar mass. No blood in the oropharynx   Neck:   No meningismus. Full ROM Hematological/Lymphatic/Immunilogical:   No cervical lymphadenopathy. Cardiovascular:   RRR. Symmetric bilateral radial and DP pulses.  No murmurs.  Respiratory:  Normal respiratory effort without tachypnea/retractions. Breath sounds are clear and equal bilaterally. No wheezes/rales/rhonchi.  Musculoskeletal:   Normal range of motion in all extremities. No joint effusions.  No lower extremity tenderness.  No edema. Neurologic:   Normal speech and language.  Motor grossly intact. No gross focal neurologic deficits are appreciated.  Skin:    Skin is warm, dry and intact. No rash noted.  No petechiae, purpura, or bullae.  ____________________________________________    LABS (pertinent positives/negatives) (all labs ordered are listed, but only abnormal results are displayed) Labs Reviewed - No data to display ____________________________________________   EKG    ____________________________________________    RADIOLOGY  No results found.  ____________________________________________   PROCEDURES Procedures  ____________________________________________   INITIAL IMPRESSION / ASSESSMENT AND PLAN / ED COURSE  Pertinent labs & imaging results that were available during my care of the patient were reviewed by me and considered in my medical decision making (see chart for details).  Patient presents to the ED requesting preemptive Vicodin refill due to combination of acute facial pain related to epistaxis nasal packing and his typical chronic pain for which he takes the Vicodin. Review of control substance reporting system confirms that the patient received 60  tablets of Vicodin 5 days ago. He confirms he has not run out. He is well-appearing and does not show any signs or symptoms of complication from the epistaxis or Coumadin use or nasal packing. I informed him that I'm unable to provide a Vicodin refill at this time. Patient becomes very upset by this. I acknowledged his frustration and informed him this is due to department policy and state law. Patient immediately left the treatment room and left the ED with his family member pushing his wheelchair without waiting for discharge instructions.      ____________________________________________   FINAL CLINICAL IMPRESSION(S) / ED DIAGNOSES  Final diagnoses:  Chronic pain syndrome  Left facial pressure and pain      Discharge Medication List as of 06/02/2017  5:10 PM       Portions of this note were generated with dragon dictation software. Dictation errors may occur despite best attempts at proofreading.    Carrie Mew, MD 06/02/17 1758

## 2017-06-02 NOTE — ED Notes (Signed)
Dr. Joni Fears at bedside speaking to patient. Patient filled a prescription for Vicodin 5 days ago. Dr. Joni Fears told patient he will not write him for any more. Patient states, "That is a bunch of bullshit. I am a veteran." Dr. Joni Fears offered to refer patient to a pain management clinic. Patient states, "Pain management clinics don't work. I am going to have to write some letters. This is bullshit".

## 2017-06-02 NOTE — ED Notes (Signed)
No labs at this time per dr Joni Fears.

## 2017-06-03 DIAGNOSIS — R040 Epistaxis: Secondary | ICD-10-CM | POA: Diagnosis not present

## 2017-06-15 DIAGNOSIS — I509 Heart failure, unspecified: Secondary | ICD-10-CM | POA: Diagnosis not present

## 2017-06-15 DIAGNOSIS — N183 Chronic kidney disease, stage 3 (moderate): Secondary | ICD-10-CM | POA: Diagnosis not present

## 2017-06-15 DIAGNOSIS — E119 Type 2 diabetes mellitus without complications: Secondary | ICD-10-CM | POA: Diagnosis not present

## 2017-06-15 DIAGNOSIS — B182 Chronic viral hepatitis C: Secondary | ICD-10-CM | POA: Diagnosis not present

## 2017-06-15 DIAGNOSIS — I4891 Unspecified atrial fibrillation: Secondary | ICD-10-CM | POA: Diagnosis not present

## 2017-06-15 DIAGNOSIS — E860 Dehydration: Secondary | ICD-10-CM | POA: Diagnosis not present

## 2017-06-23 DIAGNOSIS — F4310 Post-traumatic stress disorder, unspecified: Secondary | ICD-10-CM | POA: Diagnosis not present

## 2017-06-23 DIAGNOSIS — I509 Heart failure, unspecified: Secondary | ICD-10-CM | POA: Diagnosis not present

## 2017-06-23 DIAGNOSIS — B182 Chronic viral hepatitis C: Secondary | ICD-10-CM | POA: Diagnosis not present

## 2017-06-23 DIAGNOSIS — I119 Hypertensive heart disease without heart failure: Secondary | ICD-10-CM | POA: Diagnosis not present

## 2017-06-29 DIAGNOSIS — D631 Anemia in chronic kidney disease: Secondary | ICD-10-CM | POA: Diagnosis not present

## 2017-06-29 DIAGNOSIS — E1122 Type 2 diabetes mellitus with diabetic chronic kidney disease: Secondary | ICD-10-CM | POA: Diagnosis not present

## 2017-06-29 DIAGNOSIS — N184 Chronic kidney disease, stage 4 (severe): Secondary | ICD-10-CM | POA: Diagnosis not present

## 2017-06-29 DIAGNOSIS — E875 Hyperkalemia: Secondary | ICD-10-CM | POA: Diagnosis not present

## 2017-06-29 DIAGNOSIS — N2581 Secondary hyperparathyroidism of renal origin: Secondary | ICD-10-CM | POA: Diagnosis not present

## 2017-06-29 DIAGNOSIS — I10 Essential (primary) hypertension: Secondary | ICD-10-CM | POA: Diagnosis not present

## 2017-07-07 DIAGNOSIS — N185 Chronic kidney disease, stage 5: Secondary | ICD-10-CM | POA: Diagnosis not present

## 2017-07-07 DIAGNOSIS — D631 Anemia in chronic kidney disease: Secondary | ICD-10-CM | POA: Diagnosis not present

## 2017-07-07 DIAGNOSIS — N2581 Secondary hyperparathyroidism of renal origin: Secondary | ICD-10-CM | POA: Diagnosis not present

## 2017-07-07 DIAGNOSIS — I10 Essential (primary) hypertension: Secondary | ICD-10-CM | POA: Diagnosis not present

## 2017-07-07 DIAGNOSIS — E875 Hyperkalemia: Secondary | ICD-10-CM | POA: Diagnosis not present

## 2017-07-09 DIAGNOSIS — R809 Proteinuria, unspecified: Secondary | ICD-10-CM | POA: Diagnosis not present

## 2017-07-09 DIAGNOSIS — D631 Anemia in chronic kidney disease: Secondary | ICD-10-CM | POA: Diagnosis not present

## 2017-07-09 DIAGNOSIS — E875 Hyperkalemia: Secondary | ICD-10-CM | POA: Diagnosis not present

## 2017-07-09 DIAGNOSIS — R600 Localized edema: Secondary | ICD-10-CM | POA: Diagnosis not present

## 2017-07-09 DIAGNOSIS — N183 Chronic kidney disease, stage 3 (moderate): Secondary | ICD-10-CM | POA: Diagnosis not present

## 2017-07-23 DIAGNOSIS — I119 Hypertensive heart disease without heart failure: Secondary | ICD-10-CM | POA: Diagnosis not present

## 2017-07-23 DIAGNOSIS — I4891 Unspecified atrial fibrillation: Secondary | ICD-10-CM | POA: Diagnosis not present

## 2017-07-23 DIAGNOSIS — G894 Chronic pain syndrome: Secondary | ICD-10-CM | POA: Diagnosis not present

## 2017-07-23 DIAGNOSIS — N183 Chronic kidney disease, stage 3 (moderate): Secondary | ICD-10-CM | POA: Diagnosis not present

## 2017-08-03 DIAGNOSIS — N2581 Secondary hyperparathyroidism of renal origin: Secondary | ICD-10-CM | POA: Diagnosis not present

## 2017-08-03 DIAGNOSIS — I10 Essential (primary) hypertension: Secondary | ICD-10-CM | POA: Diagnosis not present

## 2017-08-03 DIAGNOSIS — D631 Anemia in chronic kidney disease: Secondary | ICD-10-CM | POA: Diagnosis not present

## 2017-08-03 DIAGNOSIS — N184 Chronic kidney disease, stage 4 (severe): Secondary | ICD-10-CM | POA: Diagnosis not present

## 2017-08-05 DIAGNOSIS — H40003 Preglaucoma, unspecified, bilateral: Secondary | ICD-10-CM | POA: Diagnosis not present

## 2017-08-11 DIAGNOSIS — E1122 Type 2 diabetes mellitus with diabetic chronic kidney disease: Secondary | ICD-10-CM | POA: Diagnosis not present

## 2017-08-11 DIAGNOSIS — I10 Essential (primary) hypertension: Secondary | ICD-10-CM | POA: Diagnosis not present

## 2017-08-11 DIAGNOSIS — N185 Chronic kidney disease, stage 5: Secondary | ICD-10-CM | POA: Diagnosis not present

## 2017-08-11 DIAGNOSIS — N2581 Secondary hyperparathyroidism of renal origin: Secondary | ICD-10-CM | POA: Diagnosis not present

## 2017-08-11 DIAGNOSIS — D631 Anemia in chronic kidney disease: Secondary | ICD-10-CM | POA: Diagnosis not present

## 2017-08-14 ENCOUNTER — Encounter (INDEPENDENT_AMBULATORY_CARE_PROVIDER_SITE_OTHER): Payer: Self-pay | Admitting: Vascular Surgery

## 2017-08-14 ENCOUNTER — Ambulatory Visit (INDEPENDENT_AMBULATORY_CARE_PROVIDER_SITE_OTHER): Payer: Medicare Other | Admitting: Vascular Surgery

## 2017-08-14 VITALS — BP 159/98 | HR 68 | Resp 16 | Ht 73.0 in | Wt 174.2 lb

## 2017-08-14 DIAGNOSIS — N185 Chronic kidney disease, stage 5: Secondary | ICD-10-CM | POA: Diagnosis not present

## 2017-08-14 DIAGNOSIS — I4891 Unspecified atrial fibrillation: Secondary | ICD-10-CM | POA: Diagnosis not present

## 2017-08-14 DIAGNOSIS — E119 Type 2 diabetes mellitus without complications: Secondary | ICD-10-CM

## 2017-08-14 DIAGNOSIS — F4310 Post-traumatic stress disorder, unspecified: Secondary | ICD-10-CM | POA: Diagnosis not present

## 2017-08-14 DIAGNOSIS — I10 Essential (primary) hypertension: Secondary | ICD-10-CM | POA: Insufficient documentation

## 2017-08-14 NOTE — Assessment & Plan Note (Signed)
Recommend:  The patient has advanced renal disease but currently does not have dialysis access.  The patient has elected to move forward with peritoneal dialysis. Although there is a history of prior operations, this does not appear at this time to be a prohibitive situation.  This was also discussed.  Risk and benefits were reviewed the patient.  Indications for the procedure were reviewed.  All questions were answered, the patient agrees to proceed with laparoscopic PD catheter insertion.  Discussion included the treatment options for dialysis access and perioperative management.   

## 2017-08-14 NOTE — Assessment & Plan Note (Signed)
blood glucose control important in reducing the progression of atherosclerotic disease. Also, involved in wound healing. On appropriate medications.  

## 2017-08-14 NOTE — Patient Instructions (Signed)
Peritoneal Dialysis Catheter Placement Peritoneal dialysis catheter placement is a surgery to insert a thin, flexible tube (catheter) in your abdomen. This surgery must be done before you begin peritoneal dialysis. Dialysis is a treatment that replaces some of the work that healthy kidneys do. During dialysis, wastes, salt, and extra water are removed from the blood. In peritoneal dialysis, these tasks are performed by transferring a fluid called dialysate to and from your abdomen during each session. The fluid goes through the catheter to enter the abdomen at the start of each dialysis session, and it drains out of the body through the catheter at the end of each session. The catheter will be small, soft, and easy to conceal. The surgery is usually done at least 2 weeks before you begin dialysis. Tell a health care provider about:  Any allergies you have.  All medicines you are taking, including vitamins, herbs, eye drops, creams, and over-the-counter medicines.  Use of steroids (by mouth or as creams).  Any problems you or family members have had with anesthetic medicines.  Any blood disorders you have.  Any surgeries you have had.  Smoking history.  Possibility of pregnancy, if this applies.  Any medical conditions you have. What are the risks? Generally, peritoneal dialysis catheter placement is a safe procedure. However, as with any procedure, complications can occur. Possible complications include:  Excessive bleeding.  Pain.  A collection of blood near the incision (hematoma).  Infection.  Slow healing.  Catheter problems after the surgery, such as the catheter becoming blocked, the catheter moving out of place, the catheter poking into or wrapping around intestines, or fluid leaking around the catheter.  Scarring.  Skin damage.  Damage to blood vessels, tissues, or organs (such as the intestines) in the abdomen area.  What happens before the procedure?  Ask your  health care provider about changing or stopping your regular medicines. You may need to stop taking certain medicines up to 2 weeks before the procedure.  Do not eat or drink anything for at least 8 hours before the procedure.  You may be asked to wash with an antibacterial soap the night before or the morning of the procedure.  Make plans to have someone drive you home after the procedure or after your hospital stay. Ask your health care provider whether you should expect to stay in the hospital overnight or go home the same day. What happens during the procedure? The surgeon may use either an open or laparoscopic technique for this surgery.  Open surgery. ? You will be given a medicine to make you sleep through the procedure (general anesthetic). ? Once you are asleep, the surgeon will make a small cut (incision) in your abdomen. ? The catheter will be put in place. ? The incision will be closed with stitches or staples.  Laparoscopic surgery. ? You will be given a medicine to numb the site selected for the incision (local anesthetic). ? When the area is numb, the surgeon will make a small incision in your abdomen. ? A thin, lighted tube with a tiny camera on the end (laparoscope) will be put through the incision. The camera sends pictures to a video screen in the operating room. This lets the surgeon see inside the abdomen during the procedure. ? The catheter will be put in place. ? The incision will be closed with stitches or staples.  What happens after the procedure?  You will be monitored closely in a recovery area. You may feel groggy   from the anesthetic.  You may have some pain. If so, you will be given pain medicine.  You may be able to go home the same day as the procedure, or you may need to stay in the hospital overnight.  You will be given instructions on how to care for the catheter and how it is used for the dialysis process.  Your body will heal around the catheter  to hold it in place. This information is not intended to replace advice given to you by your health care provider. Make sure you discuss any questions you have with your health care provider. Document Released: 09/24/2009 Document Revised: 03/13/2016 Document Reviewed: 03/28/2013 Elsevier Interactive Patient Education  2017 [employer]  

## 2017-08-14 NOTE — Assessment & Plan Note (Signed)
blood pressure control important in reducing the progression of atherosclerotic disease. On appropriate oral medications.  

## 2017-08-14 NOTE — Progress Notes (Signed)
Patient ID: Robert Bentley, male   DOB: December 22, 1940, 76 y.o.   MRN: [id number]  Chief Complaint  Patient presents with  . Follow-up    ref Holley Raring pd cath    HPI Robert Bentley is a 76 y.o. male.  I am asked to see the patient by DR. Lateef for evaluation of CKD and need for a PD catheter.  The patient reports continual decline in his renal function with his most recent GFR being 15.  This has been as low as 12.  He has a litany of other medical issues and I have seen him for peripheral arterial disease and carotid disease previously.  These are stable without current issues.  His only previous abdominal surgery was an open cholecystectomy some years ago.  He denies fever or chills.  He has excellent help at home with a wife who can help him with PD.  He has not yet started dialysis.  He is interested in PD over hemodialysis if at all possible.   Past Medical History:  Diagnosis Date  . Anxiety   . Atrial fibrillation ([place])   . CHF (congestive heart failure) ([place])   . Coronary artery disease   . Diabetes mellitus without complication ([place])   . Hepatitis C   . HIV disease ([place])   . Hypertension   . PTSD (post-traumatic stress disorder)   . Renal failure     Past Surgical History:  Procedure Laterality Date  . APPENDECTOMY    . cardiac stenting    . CHOLECYSTECTOMY    . EP IMPLANTABLE DEVICE    . FRACTURE SURGERY      Family History  Problem Relation Age of Onset  . Heart failure Mother   . Diabetes Mother   No bleeding disorders, clotting disorders, or autoimmune diseases  Social History Social History  Substance Use Topics  . Smoking status: Never Smoker  . Smokeless tobacco: Never Used  . Alcohol use No  Married  Allergies  Allergen Reactions  . Morphine Nausea And Vomiting, Rash and Other (See Comments)    Reactions: "gets violent"  . Efavirenz Other (See Comments)    Reaction:  Unknown   . Statins Hives  . Sulfamethizole Other (See Comments)    fever   . Morphine And Related Nausea And Vomiting and Rash    Current Outpatient Prescriptions  Medication Sig Dispense Refill  . ALPRAZolam (XANAX) 0.5 MG tablet Take 0.5 mg by mouth 2 (two) times daily as needed for anxiety.    . bisacodyl (DULCOLAX) 5 MG EC tablet Take 15 mg by mouth daily as needed for moderate constipation.    . Cholecalciferol (VITAMIN D3) 2000 UNITS TABS Take 1 tablet by mouth daily.    Marland Kitchen diltiazem (DILACOR XR) 240 MG 24 hr capsule Take 240 mg by mouth daily.    . dolutegravir (TIVICAY) 50 MG tablet Take 50 mg by mouth daily.    . furosemide (LASIX) 80 MG tablet Take 80 mg by mouth daily as needed for fluid or edema.     Marland Kitchen glimepiride (AMARYL) 2 MG tablet Take 2 mg by mouth daily as needed (for high blood sugar).    Marland Kitchen HYDROcodone-acetaminophen (NORCO) 10-325 MG per tablet Take 1 tablet by mouth every 6 (six) hours as needed for severe pain.    . isosorbide mononitrate (IMDUR) 30 MG 24 hr tablet Take 15 mg by mouth every morning.     . lactulose (CHRONULAC) 10 GM/15ML solution Take 20  g by mouth 2 (two) times daily as needed for mild constipation.    Marland Kitchen lamiVUDine (EPIVIR) 150 MG tablet Take 150 mg by mouth daily.    Marland Kitchen latanoprost (XALATAN) 0.005 % ophthalmic solution INSTILL ONE DROP TO BOTH EYES EVERY NIGHT  6  . Multiple Vitamins-Iron (MULTIVITAMINS WITH IRON) TABS tablet Take 1 tablet by mouth daily.    . ondansetron (ZOFRAN) 8 MG tablet Take 8 mg by mouth every 8 (eight) hours as needed for nausea or vomiting.    . sodium bicarbonate 650 MG tablet Take 1,300 mg by mouth 2 (two) times daily.     . sodium polystyrene (KAYEXALATE) 15 GM/60ML suspension Take 15 g by mouth 3 (three) times a week.    . timolol (BETIMOL) 0.5 % ophthalmic solution Place 1 drop into both eyes at bedtime.    Marland Kitchen warfarin (COUMADIN) 5 MG tablet Take 5 mg by mouth daily.  3  . meclizine (ANTIVERT) 25 MG tablet Take 1 tablet (25 mg total) by mouth 3 (three) times daily as needed for dizziness. (Patient  not taking: Reported on 08/14/2017) 30 tablet 0   No current facility-administered medications for this visit.       REVIEW OF SYSTEMS (Negative unless checked)  Constitutional: '[]' Weight loss  '[]' Fever  '[]' Chills Cardiac: '[]' Chest pain   '[]' Chest pressure   '[x]' Palpitations   '[]' Shortness of breath when laying flat   '[]' Shortness of breath at rest   '[x]' Shortness of breath with exertion. Vascular:  '[]' Pain in legs with walking   '[]' Pain in legs at rest   '[]' Pain in legs when laying flat   '[]' Claudication   '[x]' Pain in feet when walking  '[]' Pain in feet at rest  '[]' Pain in feet when laying flat   '[]' History of DVT   '[]' Phlebitis   '[]' Swelling in legs   '[]' Varicose veins   '[]' Non-healing ulcers Pulmonary:   '[]' Uses home oxygen   '[]' Productive cough   '[]' Hemoptysis   '[]' Wheeze  '[x]' COPD   '[]' Asthma Neurologic:  '[]' Dizziness  '[]' Blackouts   '[]' Seizures   '[]' History of stroke   '[]' History of TIA  '[]' Aphasia   '[]' Temporary blindness   '[]' Dysphagia   '[]' Weakness or numbness in arms   '[]' Weakness or numbness in legs Musculoskeletal:  '[]' Arthritis   '[]' Joint swelling   '[]' Joint pain   '[]' Low back pain Hematologic:  '[]' Easy bruising  '[]' Easy bleeding   '[]' Hypercoagulable state   '[x]' Anemic  '[]' Hepatitis Gastrointestinal:  '[]' Blood in stool   '[]' Vomiting blood  '[x]' Gastroesophageal reflux/heartburn   '[]' Abdominal pain Genitourinary:  '[x]' Chronic kidney disease   '[]' Difficult urination  '[]' Frequent urination  '[]' Burning with urination   '[]' Hematuria Skin:  '[]' Rashes   '[]' Ulcers   '[]' Wounds Psychological:  '[]' History of anxiety   '[]'  History of major depression.    Physical Exam BP (!) 159/98 (BP Location: Right Arm)   Pulse 68   Resp 16   Ht '6\' 1"'  (1.854 m)   Wt 79 kg (174 lb 3.2 oz)   BMI 22.98 kg/m  Gen:  WD/WN, NAD Head: /AT, No temporalis wasting.  Ear/Nose/Throat: Hearing grossly intact, nares w/o erythema or drainage, oropharynx w/o Erythema/Exudate Eyes: Conjunctiva clear, sclera non-icteric  Neck: trachea midline.  No JVD.  Pulmonary:  Good  air movement, respirations not labored, no use of accessory muscles Cardiac: Irregular Vascular:  Vessel Right Left  Radial Palpable Palpable  Gastrointestinal: soft, non-tender/non-distended. No guarding/reflex.  Well-healed right subcostal incision Musculoskeletal: M/S 5/5 throughout.  Extremities without ischemic changes.  No deformity or atrophy.  Mild lower extremity edema. Neurologic: Sensation grossly intact in extremities.  Symmetrical.  Speech is fluent. Motor exam as listed above. Psychiatric: Judgment intact, Mood & affect appropriate for pt's clinical situation. Dermatologic: No rashes or ulcers noted.  No cellulitis or open wounds.    Radiology No results found.  Labs Recent Results (from the past 2160 hour(s))  CBC with Differential     Status: Abnormal   Collection Time: 05/29/17 11:46 PM  Result Value Ref Range   WBC 10.4 3.8 - 10.6 K/uL   RBC 2.47 (L) 4.40 - 5.90 MIL/uL   Hemoglobin 7.6 (L) 13.0 - 18.0 g/dL   HCT 23.4 (L) 40.0 - 52.0 %   MCV 95.0 80.0 - 100.0 fL   MCH 30.7 26.0 - 34.0 pg   MCHC 32.3 32.0 - 36.0 g/dL   RDW 15.2 (H) 11.5 - 14.5 %   Platelets 134 (L) 150 - 440 K/uL   Neutrophils Relative % 69 %   Neutro Abs 7.2 (H) 1.4 - 6.5 K/uL   Lymphocytes Relative 23 %   Lymphs Abs 2.4 1.0 - 3.6 K/uL   Monocytes Relative 7 %   Monocytes Absolute 0.7 0.2 - 1.0 K/uL   Eosinophils Relative 1 %   Eosinophils Absolute 0.1 0 - 0.7 K/uL   Basophils Relative 0 %   Basophils Absolute 0.0 0 - 0.1 K/uL  Basic metabolic panel     Status: Abnormal   Collection Time: 05/29/17 11:46 PM  Result Value Ref Range   Sodium 141 135 - 145 mmol/L   Potassium 5.4 (H) 3.5 - 5.1 mmol/L   Chloride 106 101 - 111 mmol/L   CO2 26 22 - 32 mmol/L   Glucose, Bld 242 (H) 65 - 99 mg/dL   BUN 85 (H) 6 - 20 mg/dL   Creatinine, Ser 3.70 (H) 0.61 - 1.24 mg/dL   Calcium 8.3 (L) 8.9 - 10.3 mg/dL   GFR calc non Af Amer 15 (L) >60 mL/min   GFR  calc Af Amer 17 (L) >60 mL/min    Comment: (NOTE) The eGFR has been calculated using the CKD EPI equation. This calculation has not been validated in all clinical situations. eGFR's persistently <60 mL/min signify possible Chronic Kidney Disease.    Anion gap 9 5 - 15  Protime-INR     Status: Abnormal   Collection Time: 05/29/17 11:46 PM  Result Value Ref Range   Prothrombin Time 22.3 (H) 11.4 - 15.2 seconds   INR 1.93   ABO/Rh     Status: None   Collection Time: 05/29/17 11:46 PM  Result Value Ref Range   ABO/RH(D) O POS   TSH     Status: Abnormal   Collection Time: 05/29/17 11:46 PM  Result Value Ref Range   TSH 4.633 (H) 0.350 - 4.500 uIU/mL    Comment: Performed by a 3rd Generation assay with a functional sensitivity of <=0.01 uIU/mL.  Prepare RBC     Status: None   Collection Time: 05/30/17 12:29 AM  Result Value Ref Range   Order Confirmation ORDER PROCESSED BY BLOOD BANK   Type and screen     Status: None   Collection Time: 05/30/17 12:29 AM  Result Value Ref Range   ABO/RH(D) O POS    Antibody Screen NEG    Sample Expiration 06/02/2017    Unit  Number [id number]    Blood Component Type RCLI PHER 2    Unit division 00    Status of Unit ISSUED,FINAL    Transfusion Status OK TO TRANSFUSE    Crossmatch Result Compatible    Unit Number [id number]    Blood Component Type RCLI PHER 2    Unit division 00    Status of Unit ISSUED,FINAL    Transfusion Status OK TO TRANSFUSE    Crossmatch Result Compatible   BPAM RBC     Status: None   Collection Time: 05/30/17 12:29 AM  Result Value Ref Range   ISSUE DATE / TIME 973532992426    Blood Product Unit Number [id number]    PRODUCT CODE X2119E17    Unit Type and Rh 9500    Blood Product Expiration Date 408144818563    ISSUE DATE / TIME 149702637858    Blood Product Unit Number [id number]    PRODUCT CODE [id number]    Unit Type and Rh 9500    Blood Product Expiration Date 096283662947   Glucose, capillary      Status: Abnormal   Collection Time: 05/30/17  8:12 AM  Result Value Ref Range   Glucose-Capillary 157 (H) 65 - 99 mg/dL  CBC     Status: Abnormal   Collection Time: 05/30/17  8:39 AM  Result Value Ref Range   WBC 7.7 3.8 - 10.6 K/uL   RBC 2.89 (L) 4.40 - 5.90 MIL/uL   Hemoglobin 8.7 (L) 13.0 - 18.0 g/dL   HCT 26.4 (L) 40.0 - 52.0 %   MCV 91.6 80.0 - 100.0 fL   MCH 30.1 26.0 - 34.0 pg   MCHC 32.9 32.0 - 36.0 g/dL   RDW 17.4 (H) 11.5 - 14.5 %   Platelets 111 (L) 150 - 440 K/uL  Basic metabolic panel     Status: Abnormal   Collection Time: 05/30/17  8:39 AM  Result Value Ref Range   Sodium 142 135 - 145 mmol/L   Potassium 4.9 3.5 - 5.1 mmol/L   Chloride 107 101 - 111 mmol/L   CO2 27 22 - 32 mmol/L   Glucose, Bld 144 (H) 65 - 99 mg/dL   BUN 94 (H) 6 - 20 mg/dL   Creatinine, Ser 3.44 (H) 0.61 - 1.24 mg/dL   Calcium 8.5 (L) 8.9 - 10.3 mg/dL   GFR calc non Af Amer 16 (L) >60 mL/min   GFR calc Af Amer 19 (L) >60 mL/min    Comment: (NOTE) The eGFR has been calculated using the CKD EPI equation. This calculation has not been validated in all clinical situations. eGFR's persistently <60 mL/min signify possible Chronic Kidney Disease.    Anion gap 8 5 - 15  Miscellaneous [facility] test (send-out)     Status: None   Collection Time: 05/30/17  8:39 AM  Result Value Ref Range   [facility] test code 6546    [facility] test name HEMOGLOBIN A1C    Misc [facility] result COMMENT     Comment: (NOTE) Test Ordered: 001453 Hemoglobin A1c Hemoglobin A1c                 5.8         [H ] %        BN     Reference Range: 4.8-5.6  Pre-diabetes: 5.7 - 6.4         Diabetes: >6.4         Glycemic control for adults with diabetes: <7.0 Performed At: [facility] [place], [place] [id number] Lindon Romp MD [id number]   Glucose, capillary     Status: Abnormal   Collection Time: 05/30/17 10:11 AM  Result Value Ref Range   Glucose-Capillary  159 (H) 65 - 99 mg/dL  Glucose, capillary     Status: Abnormal   Collection Time: 05/30/17  1:06 PM  Result Value Ref Range   Glucose-Capillary 133 (H) 65 - 99 mg/dL  Hemoglobin     Status: Abnormal   Collection Time: 05/30/17  1:20 PM  Result Value Ref Range   Hemoglobin 8.3 (L) 13.0 - 18.0 g/dL    Assessment/Plan:  Atrial fibrillation ([place]) Followed by cardiology.  Now has a defibrillator in place  Hypertension blood pressure control important in reducing the progression of atherosclerotic disease. On appropriate oral medications.   Diabetes mellitus without complication (HCC) blood glucose control important in reducing the progression of atherosclerotic disease. Also, involved in wound healing. On appropriate medications.   PTSD (post-traumatic stress disorder) Now with full disability  CKD (chronic kidney disease) stage 5, GFR less than 15 ml/min (H[facility])  Recommend:  The patient has advanced renal disease but currently does not have dialysis access.  The patient has elected to move forward with peritoneal dialysis. Although there is a history of prior operations, this does not appear at this time to be a prohibitive situation.  This was also discussed.  Risk and benefits were reviewed the patient.  Indications for the procedure were reviewed.  All questions were answered, the patient agrees to proceed with laparoscopic PD catheter insertion.  Discussion included the treatment options for dialysis access and perioperative management.        Leotis Pain 08/14/2017, 4:09 PM   This note was created with Dragon medical transcription system.  Any errors from dictation are unintentional.

## 2017-08-14 NOTE — Assessment & Plan Note (Signed)
Followed by cardiology.  Now has a defibrillator in place

## 2017-08-14 NOTE — Assessment & Plan Note (Signed)
Now with full disability

## 2017-08-18 DIAGNOSIS — G894 Chronic pain syndrome: Secondary | ICD-10-CM | POA: Diagnosis not present

## 2017-08-18 DIAGNOSIS — F4310 Post-traumatic stress disorder, unspecified: Secondary | ICD-10-CM | POA: Diagnosis not present

## 2017-08-18 DIAGNOSIS — I4891 Unspecified atrial fibrillation: Secondary | ICD-10-CM | POA: Diagnosis not present

## 2017-08-18 DIAGNOSIS — M79604 Pain in right leg: Secondary | ICD-10-CM | POA: Diagnosis not present

## 2017-08-20 ENCOUNTER — Other Ambulatory Visit (INDEPENDENT_AMBULATORY_CARE_PROVIDER_SITE_OTHER): Payer: Self-pay | Admitting: Vascular Surgery

## 2017-08-24 ENCOUNTER — Inpatient Hospital Stay: Admission: RE | Admit: 2017-08-24 | Payer: Medicare Other | Source: Ambulatory Visit

## 2017-08-26 ENCOUNTER — Ambulatory Visit: Admission: RE | Admit: 2017-08-26 | Payer: Medicare Other | Source: Ambulatory Visit | Admitting: Vascular Surgery

## 2017-08-26 ENCOUNTER — Encounter: Admission: RE | Payer: Self-pay | Source: Ambulatory Visit

## 2017-08-26 DIAGNOSIS — I429 Cardiomyopathy, unspecified: Secondary | ICD-10-CM | POA: Diagnosis not present

## 2017-08-26 DIAGNOSIS — I5020 Unspecified systolic (congestive) heart failure: Secondary | ICD-10-CM | POA: Diagnosis not present

## 2017-08-26 DIAGNOSIS — I493 Ventricular premature depolarization: Secondary | ICD-10-CM | POA: Diagnosis not present

## 2017-08-26 DIAGNOSIS — I2510 Atherosclerotic heart disease of native coronary artery without angina pectoris: Secondary | ICD-10-CM | POA: Diagnosis not present

## 2017-08-26 DIAGNOSIS — I472 Ventricular tachycardia: Secondary | ICD-10-CM | POA: Diagnosis not present

## 2017-08-26 DIAGNOSIS — I10 Essential (primary) hypertension: Secondary | ICD-10-CM | POA: Diagnosis not present

## 2017-08-26 DIAGNOSIS — R0602 Shortness of breath: Secondary | ICD-10-CM | POA: Diagnosis not present

## 2017-08-26 DIAGNOSIS — I255 Ischemic cardiomyopathy: Secondary | ICD-10-CM | POA: Diagnosis not present

## 2017-08-26 DIAGNOSIS — E1122 Type 2 diabetes mellitus with diabetic chronic kidney disease: Secondary | ICD-10-CM | POA: Diagnosis not present

## 2017-08-26 DIAGNOSIS — I519 Heart disease, unspecified: Secondary | ICD-10-CM | POA: Diagnosis not present

## 2017-08-26 DIAGNOSIS — Z955 Presence of coronary angioplasty implant and graft: Secondary | ICD-10-CM | POA: Diagnosis not present

## 2017-08-26 DIAGNOSIS — E1100 Type 2 diabetes mellitus with hyperosmolarity without nonketotic hyperglycemic-hyperosmolar coma (NKHHC): Secondary | ICD-10-CM | POA: Diagnosis not present

## 2017-08-26 DIAGNOSIS — I5022 Chronic systolic (congestive) heart failure: Secondary | ICD-10-CM | POA: Diagnosis not present

## 2017-08-26 DIAGNOSIS — I480 Paroxysmal atrial fibrillation: Secondary | ICD-10-CM | POA: Diagnosis not present

## 2017-08-26 SURGERY — LAPAROSCOPIC INSERTION CONTINUOUS AMBULATORY PERITONEAL DIALYSIS  (CAPD) CATHETER
Anesthesia: General

## 2017-08-28 ENCOUNTER — Encounter (INDEPENDENT_AMBULATORY_CARE_PROVIDER_SITE_OTHER): Payer: Self-pay

## 2017-09-02 ENCOUNTER — Other Ambulatory Visit (INDEPENDENT_AMBULATORY_CARE_PROVIDER_SITE_OTHER): Payer: Self-pay

## 2017-09-02 ENCOUNTER — Other Ambulatory Visit: Payer: Self-pay

## 2017-09-02 ENCOUNTER — Encounter
Admission: RE | Admit: 2017-09-02 | Discharge: 2017-09-02 | Disposition: A | Discharge status: Home / Self Care | Payer: Medicare Other | Source: Ambulatory Visit | Attending: Vascular Surgery | Admitting: Vascular Surgery

## 2017-09-02 DIAGNOSIS — I509 Heart failure, unspecified: Secondary | ICD-10-CM | POA: Insufficient documentation

## 2017-09-02 DIAGNOSIS — Z885 Allergy status to narcotic agent status: Secondary | ICD-10-CM | POA: Diagnosis not present

## 2017-09-02 DIAGNOSIS — Z833 Family history of diabetes mellitus: Secondary | ICD-10-CM | POA: Insufficient documentation

## 2017-09-02 DIAGNOSIS — F4310 Post-traumatic stress disorder, unspecified: Secondary | ICD-10-CM | POA: Diagnosis not present

## 2017-09-02 DIAGNOSIS — Z9049 Acquired absence of other specified parts of digestive tract: Secondary | ICD-10-CM | POA: Diagnosis not present

## 2017-09-02 DIAGNOSIS — Z79891 Long term (current) use of opiate analgesic: Secondary | ICD-10-CM | POA: Insufficient documentation

## 2017-09-02 DIAGNOSIS — B20 Human immunodeficiency virus [HIV] disease: Secondary | ICD-10-CM | POA: Diagnosis not present

## 2017-09-02 DIAGNOSIS — I2510 Atherosclerotic heart disease of native coronary artery without angina pectoris: Secondary | ICD-10-CM | POA: Diagnosis not present

## 2017-09-02 DIAGNOSIS — X58XXXA Exposure to other specified factors, initial encounter: Secondary | ICD-10-CM | POA: Insufficient documentation

## 2017-09-02 DIAGNOSIS — Z01812 Encounter for preprocedural laboratory examination: Secondary | ICD-10-CM | POA: Diagnosis not present

## 2017-09-02 DIAGNOSIS — I132 Hypertensive heart and chronic kidney disease with heart failure and with stage 5 chronic kidney disease, or end stage renal disease: Secondary | ICD-10-CM | POA: Insufficient documentation

## 2017-09-02 DIAGNOSIS — Z79899 Other long term (current) drug therapy: Secondary | ICD-10-CM | POA: Diagnosis not present

## 2017-09-02 DIAGNOSIS — Z8249 Family history of ischemic heart disease and other diseases of the circulatory system: Secondary | ICD-10-CM | POA: Insufficient documentation

## 2017-09-02 DIAGNOSIS — F419 Anxiety disorder, unspecified: Secondary | ICD-10-CM | POA: Insufficient documentation

## 2017-09-02 DIAGNOSIS — N185 Chronic kidney disease, stage 5: Secondary | ICD-10-CM | POA: Diagnosis not present

## 2017-09-02 DIAGNOSIS — Z9889 Other specified postprocedural states: Secondary | ICD-10-CM | POA: Diagnosis not present

## 2017-09-02 DIAGNOSIS — I4891 Unspecified atrial fibrillation: Secondary | ICD-10-CM | POA: Diagnosis not present

## 2017-09-02 DIAGNOSIS — Z888 Allergy status to other drugs, medicaments and biological substances status: Secondary | ICD-10-CM | POA: Insufficient documentation

## 2017-09-02 DIAGNOSIS — E1122 Type 2 diabetes mellitus with diabetic chronic kidney disease: Secondary | ICD-10-CM | POA: Insufficient documentation

## 2017-09-02 DIAGNOSIS — Z7901 Long term (current) use of anticoagulants: Secondary | ICD-10-CM | POA: Insufficient documentation

## 2017-09-02 HISTORY — DX: Acute myocardial infarction, unspecified: I21.9

## 2017-09-02 HISTORY — DX: Personal history of other diseases of the digestive system: Z87.19

## 2017-09-02 HISTORY — DX: Presence of automatic (implantable) cardiac defibrillator: Z95.810

## 2017-09-02 LAB — CBC
HEMATOCRIT: 34.4 % — AB (ref 40.0–52.0)
HEMOGLOBIN: 11.1 g/dL — AB (ref 13.0–18.0)
MCH: 30.3 pg (ref 26.0–34.0)
MCHC: 32.3 g/dL (ref 32.0–36.0)
MCV: 93.8 fL (ref 80.0–100.0)
Platelets: 141 10*3/uL — ABNORMAL LOW (ref 150–440)
RBC: 3.66 MIL/uL — AB (ref 4.40–5.90)
RDW: 16.1 % — ABNORMAL HIGH (ref 11.5–14.5)
WBC: 6.8 10*3/uL (ref 3.8–10.6)

## 2017-09-02 LAB — COMPREHENSIVE METABOLIC PANEL
ALT: 10 U/L — ABNORMAL LOW (ref 17–63)
ANION GAP: 9 (ref 5–15)
AST: 14 U/L — AB (ref 15–41)
Albumin: 3.9 g/dL (ref 3.5–5.0)
Alkaline Phosphatase: 91 U/L (ref 38–126)
BUN: 50 mg/dL — ABNORMAL HIGH (ref 6–20)
CHLORIDE: 106 mmol/L (ref 101–111)
CO2: 24 mmol/L (ref 22–32)
Calcium: 9 mg/dL (ref 8.9–10.3)
Creatinine, Ser: 3.96 mg/dL — ABNORMAL HIGH (ref 0.61–1.24)
GFR, EST AFRICAN AMERICAN: 16 mL/min — AB (ref >60)
GFR, EST NON AFRICAN AMERICAN: 14 mL/min — AB (ref >60)
Glucose, Bld: 108 mg/dL — ABNORMAL HIGH (ref 65–99)
POTASSIUM: 4.1 mmol/L (ref 3.5–5.1)
Sodium: 139 mmol/L (ref 135–145)
Total Bilirubin: 0.7 mg/dL (ref 0.3–1.2)
Total Protein: 8.5 g/dL — ABNORMAL HIGH (ref 6.5–8.1)

## 2017-09-02 LAB — PROTIME-INR
INR: 1.86
PROTHROMBIN TIME: 21.3 s — AB (ref 11.4–15.2)

## 2017-09-02 LAB — APTT: APTT: 33 s (ref 24–36)

## 2017-09-02 LAB — SURGICAL PCR SCREEN
MRSA, PCR: NEGATIVE
Staphylococcus aureus: POSITIVE — AB

## 2017-09-02 NOTE — Patient Instructions (Addendum)
  Your procedure is scheduled on: Wednesday Nov. 21, 2018. Report to Same Day Surgery. To find out your arrival time please call [phone] between 1PM - 3PM on Tuesday Nov. 20, 2018 .  Remember: Instructions that are not followed completely may result in serious medical risk, up to and including death, or upon the discretion of your surgeon and anesthesiologist your surgery may need to be rescheduled.    _x___ 1. Do not eat food after midnight night prior to surgery. No gum   chewing or hard candies, snacks or breakfast.    April 11, 2023 drink the following: water, up until 2 hours prior to ARRIVAL time.     ____ 2. No Alcohol for 24 hours before or after surgery.   ____ 3. Bring all medications with you on the day of surgery if instructed.    __x__ 4. Notify your doctor if there is any change in your medical condition     (cold, fever, infections).    _____ 5.   Do Not Smoke or use e-cigarettes For 24 Hours Prior to Your   Surgery.  Do not use any chewable tobacco products for at least 6   hours prior to  surgery.                      Do not wear jewelry, make-up, hairpins, clips or nail polish.  Do not wear lotions, powders, or perfumes.   Do not shave 48 hours prior to surgery. Men may shave face and neck.  Do not bring valuables to the hospital.    [facility] is not responsible for any belongings or valuables.               Contacts, dentures or bridgework may not be worn into surgery.  Leave your suitcase in the car. After surgery it may be brought to your room.  For patients admitted to the hospital, discharge time is determined by your  treatment team.   Patients discharged the day of surgery will not be allowed to drive home.    Please read over the following fact sheets that you were given:   [facility] Preparing for Surgery  _x__ Take these medicines the morning of surgery with A SIP OF WATER:    1. diltiazem (DILACOR XR)   2. dolutegravir (TIVICAY)  3. isosorbide  mononitrate (IMDUR)  4. lamiVUDine (EPIVIR)      ____ Fleet Enema (as directed)   __x__ Use CHG Soap as directed on instruction sheet  ____ Use inhalers on the day of surgery and bring to hospital day of surgery  ____ Stop metformin 2 days prior to surgery    ____ Take 1/2 of usual insulin dose the night before surgery and none on the morning of          surgery.   _x__ Stop Coumadin 3 days prior to surgery.  _x__ Stop Anti-inflammatories such as Advil, Aleve, Ibuprofen, Motrin, Naproxen,  Naprosyn, Goodies powders or aspirin products. OK to take Tylenol.   ____ Stop supplements until after surgery.    ____ Bring C-Pap to the hospital.

## 2017-09-02 NOTE — Pre-Procedure Instructions (Signed)
Perioperative prescription for implanted cardiac device programming faxed to Dr. Eston Esters' office for completion and return to PAT for upcoming surgery on 11/21/20148.

## 2017-09-02 NOTE — Pre-Procedure Instructions (Signed)
Today's PAT lab results faxed to Dr. Bunnie Domino office for review and treatment as indicated.  Noted PCR screen, MRSA negative and Staph Aureus is positive.

## 2017-09-09 ENCOUNTER — Ambulatory Visit: Admission: RE | Admit: 2017-09-09 | Payer: Medicare Other | Source: Ambulatory Visit | Admitting: Vascular Surgery

## 2017-09-09 ENCOUNTER — Encounter: Admission: RE | Payer: Self-pay | Source: Ambulatory Visit

## 2017-09-09 SURGERY — LAPAROSCOPIC INSERTION CONTINUOUS AMBULATORY PERITONEAL DIALYSIS  (CAPD) CATHETER
Anesthesia: General

## 2017-09-15 DIAGNOSIS — E875 Hyperkalemia: Secondary | ICD-10-CM | POA: Diagnosis not present

## 2017-09-15 DIAGNOSIS — E1122 Type 2 diabetes mellitus with diabetic chronic kidney disease: Secondary | ICD-10-CM | POA: Diagnosis not present

## 2017-09-15 DIAGNOSIS — I10 Essential (primary) hypertension: Secondary | ICD-10-CM | POA: Diagnosis not present

## 2017-09-15 DIAGNOSIS — N2581 Secondary hyperparathyroidism of renal origin: Secondary | ICD-10-CM | POA: Diagnosis not present

## 2017-09-15 DIAGNOSIS — N184 Chronic kidney disease, stage 4 (severe): Secondary | ICD-10-CM | POA: Diagnosis not present

## 2017-09-18 DIAGNOSIS — Z992 Dependence on renal dialysis: Secondary | ICD-10-CM | POA: Diagnosis not present

## 2017-09-18 DIAGNOSIS — N186 End stage renal disease: Secondary | ICD-10-CM | POA: Diagnosis not present

## 2017-09-18 DIAGNOSIS — M79604 Pain in right leg: Secondary | ICD-10-CM | POA: Diagnosis not present

## 2017-09-18 DIAGNOSIS — I4891 Unspecified atrial fibrillation: Secondary | ICD-10-CM | POA: Diagnosis not present

## 2017-10-06 MED ORDER — CEFAZOLIN SODIUM-DEXTROSE 1-4 GM/50ML-% IV SOLN
1.0000 g | Freq: Once | INTRAVENOUS | Status: AC
  Administered 2017-10-07: 1 g via INTRAVENOUS

## 2017-10-07 ENCOUNTER — Ambulatory Visit
Admission: RE | Admit: 2017-10-07 | Discharge: 2017-10-07 | Disposition: A | Discharge status: Home / Self Care | Payer: Medicare Other | Source: Ambulatory Visit | Attending: Vascular Surgery | Admitting: Vascular Surgery

## 2017-10-07 ENCOUNTER — Encounter
Admission: RE | Disposition: A | Discharge status: Home / Self Care | Payer: Self-pay | Source: Ambulatory Visit | Attending: Vascular Surgery

## 2017-10-07 ENCOUNTER — Ambulatory Visit: Payer: Medicare Other | Admitting: Certified Registered"

## 2017-10-07 ENCOUNTER — Other Ambulatory Visit: Payer: Self-pay

## 2017-10-07 ENCOUNTER — Telehealth (INDEPENDENT_AMBULATORY_CARE_PROVIDER_SITE_OTHER): Payer: Self-pay

## 2017-10-07 ENCOUNTER — Encounter: Payer: Self-pay | Admitting: *Deleted

## 2017-10-07 DIAGNOSIS — Z833 Family history of diabetes mellitus: Secondary | ICD-10-CM | POA: Diagnosis not present

## 2017-10-07 DIAGNOSIS — I132 Hypertensive heart and chronic kidney disease with heart failure and with stage 5 chronic kidney disease, or end stage renal disease: Secondary | ICD-10-CM | POA: Diagnosis not present

## 2017-10-07 DIAGNOSIS — N186 End stage renal disease: Secondary | ICD-10-CM | POA: Diagnosis not present

## 2017-10-07 DIAGNOSIS — B1920 Unspecified viral hepatitis C without hepatic coma: Secondary | ICD-10-CM | POA: Diagnosis not present

## 2017-10-07 DIAGNOSIS — I2510 Atherosclerotic heart disease of native coronary artery without angina pectoris: Secondary | ICD-10-CM | POA: Diagnosis not present

## 2017-10-07 DIAGNOSIS — I252 Old myocardial infarction: Secondary | ICD-10-CM | POA: Diagnosis not present

## 2017-10-07 DIAGNOSIS — I4891 Unspecified atrial fibrillation: Secondary | ICD-10-CM | POA: Diagnosis not present

## 2017-10-07 DIAGNOSIS — K449 Diaphragmatic hernia without obstruction or gangrene: Secondary | ICD-10-CM | POA: Diagnosis not present

## 2017-10-07 DIAGNOSIS — Z955 Presence of coronary angioplasty implant and graft: Secondary | ICD-10-CM | POA: Insufficient documentation

## 2017-10-07 DIAGNOSIS — Z7901 Long term (current) use of anticoagulants: Secondary | ICD-10-CM | POA: Diagnosis not present

## 2017-10-07 DIAGNOSIS — Z8249 Family history of ischemic heart disease and other diseases of the circulatory system: Secondary | ICD-10-CM | POA: Insufficient documentation

## 2017-10-07 DIAGNOSIS — Z7984 Long term (current) use of oral hypoglycemic drugs: Secondary | ICD-10-CM | POA: Insufficient documentation

## 2017-10-07 DIAGNOSIS — F4310 Post-traumatic stress disorder, unspecified: Secondary | ICD-10-CM | POA: Insufficient documentation

## 2017-10-07 DIAGNOSIS — E119 Type 2 diabetes mellitus without complications: Secondary | ICD-10-CM | POA: Diagnosis not present

## 2017-10-07 DIAGNOSIS — Z95810 Presence of automatic (implantable) cardiac defibrillator: Secondary | ICD-10-CM | POA: Diagnosis not present

## 2017-10-07 DIAGNOSIS — I10 Essential (primary) hypertension: Secondary | ICD-10-CM | POA: Diagnosis not present

## 2017-10-07 DIAGNOSIS — E1122 Type 2 diabetes mellitus with diabetic chronic kidney disease: Secondary | ICD-10-CM | POA: Diagnosis not present

## 2017-10-07 DIAGNOSIS — Z992 Dependence on renal dialysis: Secondary | ICD-10-CM | POA: Diagnosis not present

## 2017-10-07 DIAGNOSIS — I499 Cardiac arrhythmia, unspecified: Secondary | ICD-10-CM | POA: Diagnosis not present

## 2017-10-07 DIAGNOSIS — Z9049 Acquired absence of other specified parts of digestive tract: Secondary | ICD-10-CM | POA: Insufficient documentation

## 2017-10-07 DIAGNOSIS — Z9889 Other specified postprocedural states: Secondary | ICD-10-CM | POA: Insufficient documentation

## 2017-10-07 DIAGNOSIS — Z79899 Other long term (current) drug therapy: Secondary | ICD-10-CM | POA: Diagnosis not present

## 2017-10-07 DIAGNOSIS — N185 Chronic kidney disease, stage 5: Secondary | ICD-10-CM | POA: Diagnosis not present

## 2017-10-07 DIAGNOSIS — I509 Heart failure, unspecified: Secondary | ICD-10-CM | POA: Diagnosis not present

## 2017-10-07 HISTORY — PX: CAPD INSERTION: SHX5233

## 2017-10-07 LAB — POCT I-STAT 4, (NA,K, GLUC, HGB,HCT)
GLUCOSE: 85 mg/dL (ref 65–99)
HCT: 37 % — ABNORMAL LOW (ref 39.0–52.0)
Hemoglobin: 12.6 g/dL — ABNORMAL LOW (ref 13.0–17.0)
Potassium: 4.8 mmol/L (ref 3.5–5.1)
Sodium: 141 mmol/L (ref 135–145)

## 2017-10-07 LAB — GLUCOSE, CAPILLARY
GLUCOSE-CAPILLARY: 84 mg/dL (ref 65–99)
GLUCOSE-CAPILLARY: 71 mg/dL (ref 65–99)

## 2017-10-07 SURGERY — LAPAROSCOPIC INSERTION CONTINUOUS AMBULATORY PERITONEAL DIALYSIS  (CAPD) CATHETER
Anesthesia: General | Laterality: Left | Wound class: Clean

## 2017-10-07 MED ORDER — ONDANSETRON HCL 4 MG/2ML IJ SOLN
INTRAMUSCULAR | Status: AC
  Administered 2017-10-07: 4 mg via INTRAVENOUS
  Filled 2017-10-07: qty 2

## 2017-10-07 MED ORDER — FENTANYL CITRATE (PF) 100 MCG/2ML IJ SOLN
25.0000 ug | INTRAMUSCULAR | Status: AC | PRN
  Administered 2017-10-07 (×6): 25 ug via INTRAVENOUS

## 2017-10-07 MED ORDER — FENTANYL CITRATE (PF) 100 MCG/2ML IJ SOLN
INTRAMUSCULAR | Status: DC | PRN
  Administered 2017-10-07 (×2): 50 ug via INTRAVENOUS

## 2017-10-07 MED ORDER — HYDROCODONE-ACETAMINOPHEN 10-325 MG PO TABS: 1.0000 | ORAL_TABLET | Freq: Four times a day (QID) | ORAL | 0 refills | Status: DC | PRN

## 2017-10-07 MED ORDER — NEOSTIGMINE METHYLSULFATE 10 MG/10ML IV SOLN
INTRAVENOUS | Status: AC
  Filled 2017-10-07: qty 1

## 2017-10-07 MED ORDER — ROCURONIUM BROMIDE 100 MG/10ML IV SOLN
INTRAVENOUS | Status: DC | PRN
  Administered 2017-10-07: 35 mg via INTRAVENOUS

## 2017-10-07 MED ORDER — NEOSTIGMINE METHYLSULFATE 10 MG/10ML IV SOLN
INTRAVENOUS | Status: DC | PRN
  Administered 2017-10-07: 4 mg via INTRAVENOUS

## 2017-10-07 MED ORDER — FENTANYL CITRATE (PF) 100 MCG/2ML IJ SOLN
INTRAMUSCULAR | Status: AC
  Administered 2017-10-07: 25 ug via INTRAVENOUS
  Filled 2017-10-07: qty 2

## 2017-10-07 MED ORDER — ONDANSETRON HCL 4 MG/2ML IJ SOLN
INTRAMUSCULAR | Status: AC
  Filled 2017-10-07: qty 2

## 2017-10-07 MED ORDER — HYDROCODONE-ACETAMINOPHEN 10-325 MG PO TABS
1.0000 | ORAL_TABLET | Freq: Four times a day (QID) | ORAL | Status: DC | PRN
  Administered 2017-10-07: 1 via ORAL

## 2017-10-07 MED ORDER — ONDANSETRON HCL 4 MG/2ML IJ SOLN
4.0000 mg | Freq: Once | INTRAMUSCULAR | Status: AC | PRN
  Administered 2017-10-07: 4 mg via INTRAVENOUS

## 2017-10-07 MED ORDER — HYDROCODONE-ACETAMINOPHEN 5-325 MG PO TABS: 1.0000 | ORAL_TABLET | Freq: Four times a day (QID) | ORAL | 0 refills | Status: DC | PRN

## 2017-10-07 MED ORDER — FENTANYL CITRATE (PF) 100 MCG/2ML IJ SOLN
INTRAMUSCULAR | Status: AC
  Filled 2017-10-07: qty 2

## 2017-10-07 MED ORDER — ETOMIDATE 2 MG/ML IV SOLN
INTRAVENOUS | Status: DC | PRN
  Administered 2017-10-07: 16 mg via INTRAVENOUS

## 2017-10-07 MED ORDER — ONDANSETRON HCL 4 MG/2ML IJ SOLN
INTRAMUSCULAR | Status: DC | PRN
  Administered 2017-10-07: 4 mg via INTRAVENOUS

## 2017-10-07 MED ORDER — CEFAZOLIN SODIUM-DEXTROSE 1-4 GM/50ML-% IV SOLN
INTRAVENOUS | Status: AC
  Filled 2017-10-07: qty 50

## 2017-10-07 MED ORDER — LIDOCAINE HCL (CARDIAC) 20 MG/ML IV SOLN
INTRAVENOUS | Status: DC | PRN
  Administered 2017-10-07: 60 mg via INTRAVENOUS

## 2017-10-07 MED ORDER — ETOMIDATE 2 MG/ML IV SOLN
INTRAVENOUS | Status: AC
  Filled 2017-10-07: qty 10

## 2017-10-07 MED ORDER — SODIUM CHLORIDE 0.9 % IV SOLN
INTRAVENOUS | Status: DC
Start: 2017-10-07 — End: 2017-10-07

## 2017-10-07 MED ORDER — HYDROCODONE-ACETAMINOPHEN 10-325 MG PO TABS
ORAL_TABLET | ORAL | Status: AC
  Administered 2017-10-07: 1 via ORAL
  Filled 2017-10-07: qty 2

## 2017-10-07 MED ORDER — LIDOCAINE HCL (PF) 2 % IJ SOLN
INTRAMUSCULAR | Status: AC
  Filled 2017-10-07: qty 10

## 2017-10-07 MED ORDER — GLYCOPYRROLATE 0.2 MG/ML IJ SOLN
INTRAMUSCULAR | Status: DC | PRN
  Administered 2017-10-07: .8 mg via INTRAVENOUS

## 2017-10-07 MED ORDER — HYDROCODONE-ACETAMINOPHEN 10-325 MG PO TABS
2.0000 | ORAL_TABLET | Freq: Once | ORAL | Status: AC
  Administered 2017-10-07: 1 via ORAL

## 2017-10-07 MED ORDER — FAMOTIDINE 20 MG PO TABS
20.0000 mg | ORAL_TABLET | Freq: Once | ORAL | Status: AC
  Administered 2017-10-07: 20 mg via ORAL

## 2017-10-07 MED ORDER — FAMOTIDINE 20 MG PO TABS
ORAL_TABLET | ORAL | Status: AC
  Administered 2017-10-07: 20 mg via ORAL
  Filled 2017-10-07: qty 1

## 2017-10-07 MED ORDER — HYDROCODONE-ACETAMINOPHEN 10-325 MG PO TABS
ORAL_TABLET | ORAL | Status: AC
  Administered 2017-10-07: 1 via ORAL
  Filled 2017-10-07: qty 1

## 2017-10-07 MED ORDER — GLYCOPYRROLATE 0.2 MG/ML IJ SOLN
INTRAMUSCULAR | Status: AC
  Filled 2017-10-07: qty 4

## 2017-10-07 SURGICAL SUPPLY — 36 items
ADAPTER BETA CAP QUINTON DIALY (ADAPTER) IMPLANT
ADAPTER CATH DIALYSIS 18.75 (CATHETERS) ×2 IMPLANT
ADAPTER CATH DIALYSIS 18.75CM (CATHETERS) ×1
ADH SKN CLS APL DERMABOND .7 (GAUZE/BANDAGES/DRESSINGS) ×1
ADPR DLYS BCP STRL PRTNL ULTEM (ADAPTER)
CANISTER SUCT 1200ML W/VALVE (MISCELLANEOUS) ×3 IMPLANT
CATH DLYS SWAN NECK 62.5CM (CATHETERS) ×3 IMPLANT
CHLORAPREP W/TINT 26ML (MISCELLANEOUS) ×3 IMPLANT
DERMABOND ADVANCED (GAUZE/BANDAGES/DRESSINGS) ×2
DERMABOND ADVANCED .7 DNX12 (GAUZE/BANDAGES/DRESSINGS) ×1 IMPLANT
ELECT CAUTERY BLADE 6.4 (BLADE) ×3 IMPLANT
ELECT REM PT RETURN 9FT ADLT (ELECTROSURGICAL) ×3
ELECTRODE REM PT RTRN 9FT ADLT (ELECTROSURGICAL) ×1 IMPLANT
GLOVE BIO SURGEON STRL SZ7 (GLOVE) ×6 IMPLANT
GLOVE INDICATOR 7.5 STRL GRN (GLOVE) ×3 IMPLANT
GOWN STRL REUS W/ TWL LRG LVL3 (GOWN DISPOSABLE) ×2 IMPLANT
GOWN STRL REUS W/ TWL XL LVL3 (GOWN DISPOSABLE) ×1 IMPLANT
GOWN STRL REUS W/TWL LRG LVL3 (GOWN DISPOSABLE) ×6
GOWN STRL REUS W/TWL XL LVL3 (GOWN DISPOSABLE) ×3
IV NS 500ML (IV SOLUTION) ×3
IV NS 500ML BAXH (IV SOLUTION) ×1 IMPLANT
KIT RM TURNOVER STRD PROC AR (KITS) ×3 IMPLANT
LABEL OR SOLS (LABEL) ×3 IMPLANT
MINICAP W/POVIDONE IODINE SOL (MISCELLANEOUS) ×3 IMPLANT
PACK LAP CHOLECYSTECTOMY (MISCELLANEOUS) ×3 IMPLANT
PENCIL ELECTRO HAND CTR (MISCELLANEOUS) ×3 IMPLANT
SET CYSTO W/LG BORE CLAMP LF (SET/KITS/TRAYS/PACK) ×3 IMPLANT
SET TRANSFER 6 W/TWIST CLAMP 5 (SET/KITS/TRAYS/PACK) ×3 IMPLANT
SPONGE DRAIN TRACH 4X4 STRL 2S (GAUZE/BANDAGES/DRESSINGS) ×3 IMPLANT
SPONGE VERSALON 4X4 4PLY (MISCELLANEOUS) ×3 IMPLANT
SUT MNCRL AB 4-0 PS2 18 (SUTURE) ×3 IMPLANT
SUT VIC AB 2-0 UR6 27 (SUTURE) ×3 IMPLANT
SUT VICRYL+ 3-0 36IN CT-1 (SUTURE) ×3 IMPLANT
TROCAR XCEL NON-BLD 11X100MML (ENDOMECHANICALS) ×3 IMPLANT
TROCAR XCEL NON-BLD 5MMX100MML (ENDOMECHANICALS) ×3 IMPLANT
TUBING INSUFFLATION (TUBING) ×3 IMPLANT

## 2017-10-07 NOTE — H&P (Signed)
[facility] Admission History & Physical  MRN : [id number]  Robert Bentley is a 76 y.o. (09/21/1941) male who presents with chief complaint of No chief complaint on file. Marland Kitchen  History of Present Illness: Patient presents today for placement of peritoneal dialysis catheter.  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  He has no complaints today.  He was previously scheduled earlier this month, but had to cancel because of the weather.  Current Facility-Administered Medications  Medication Dose Route Frequency Provider Last Rate Last Dose  . 0.9 %  sodium chloride infusion   Intravenous Continuous Martha Clan, MD      . ceFAZolin (ANCEF) 1-4 GM/50ML-% IVPB           . ceFAZolin (ANCEF) IVPB 1 g/50 mL premix  1 g Intravenous Once Algernon Huxley, MD        Past Medical History:  Diagnosis Date  . AICD (automatic cardioverter/defibrillator) present    defib.  Marland Kitchen Anxiety   . Atrial fibrillation ([place])   . CHF (congestive heart failure) ([place])   . Coronary artery disease   . Diabetes mellitus without complication ([place])   . Hepatitis C   . History of hiatal hernia 1970  . HIV disease ([place])   . Hypertension   . Myocardial infarction ([place]) 2003  . PTSD (post-traumatic stress disorder)   . Renal failure     Past Surgical History:  Procedure Laterality Date  . APPENDECTOMY    . cardiac stenting    . CHOLECYSTECTOMY    . EP IMPLANTABLE DEVICE    . FRACTURE SURGERY Right 1964   leg    Social History Social History   Tobacco Use  . Smoking status: Never Smoker  . Smokeless tobacco: Never Used  Substance Use Topics  . Alcohol use: No  . Drug use: No    Family History Family History  Problem Relation Age of Onset  . Heart failure Mother   . Diabetes Mother   No bleeding or clotting disorders  Allergies  Allergen Reactions  . Morphine Nausea And  Vomiting, Rash and Other (See Comments)    Reactions: "gets violent"  . Efavirenz Other (See Comments)    Reaction:  Unknown   . Statins Hives  . Sulfamethizole Other (See Comments)    fever      REVIEW OF SYSTEMS (Negative unless checked)  Constitutional: [] Weight loss  [] Fever  [] Chills Cardiac: [] Chest pain   [] Chest pressure   [x] Palpitations   [] Shortness of breath when laying flat   [] Shortness of breath at rest   [x] Shortness of breath with exertion. Vascular:  [] Pain in legs with walking   [] Pain in legs at rest   [] Pain in legs when laying flat   [] Claudication   [] Pain in feet when walking  [] Pain in feet at rest  [] Pain in feet when laying flat   [] History of DVT   [] Phlebitis   [] Swelling in legs   [] Varicose veins   [] Non-healing ulcers Pulmonary:   [] Uses home oxygen   [] Productive cough   [] Hemoptysis   [] Wheeze  [] COPD   [] Asthma Neurologic:  [] Dizziness  [] Blackouts   [] Seizures   [] History of stroke   [] History of TIA  [] Aphasia   [] Temporary blindness   [] Dysphagia   [] Weakness or numbness in arms   [] Weakness or numbness in legs Musculoskeletal:  [] Arthritis   [] Joint swelling   []   Joint pain   [] Low back pain Hematologic:  [] Easy bruising  [] Easy bleeding   [] Hypercoagulable state   [x] Anemic  [] Hepatitis Gastrointestinal:  [] Blood in stool   [] Vomiting blood  [x] Gastroesophageal reflux/heartburn   [x] Difficulty swallowing. Genitourinary:  [x] Chronic kidney disease   [] Difficult urination  [] Frequent urination  [] Burning with urination   [] Blood in urine Skin:  [] Rashes   [] Ulcers   [] Wounds Psychological:  [x] History of anxiety   [x]  History of major depression.  Physical Examination  Vitals:   10/07/17 1153  BP: (!) 139/98  Pulse: 89  Resp: 18  Temp: (!) 97 F (36.1 C)  TempSrc: Tympanic  SpO2: 99%  Weight: 79.4 kg (175 lb)  Height: 6\' 1"  (1.854 m)   Body mass index is 23.09 kg/m. Gen: WD/WN, NAD Head: [place]/AT, No temporalis wasting.  Ear/Nose/Throat:  Hearing grossly intact, nares w/o erythema or drainage, oropharynx w/o Erythema/Exudate,  Eyes: Conjunctiva clear, sclera non-icteric Neck: Trachea midline.  No JVD.  Pulmonary:  Good air movement, respirations not labored, no use of accessory muscles.  Cardiac: Irregular Vascular:  Vessel Right Left  Radial Palpable Palpable                                   Gastrointestinal: soft, non-tender/non-distended.  Musculoskeletal: M/S 5/5 throughout.  Extremities without ischemic changes.  No deformity or atrophy.  Neurologic: Sensation grossly intact in extremities.  Symmetrical.  Speech is fluent. Motor exam as listed above. Psychiatric: Judgment intact, Mood & affect appropriate for pt's clinical situation. Dermatologic: No rashes or ulcers noted.  No cellulitis or open wounds.      CBC Lab Results  Component Value Date   WBC 6.8 09/02/2017   HGB 12.6 (L) 10/07/2017   HCT 37.0 (L) 10/07/2017   MCV 93.8 09/02/2017   PLT 141 (L) 09/02/2017    BMET    Component Value Date/Time   NA 141 10/07/2017 1229   NA 136 11/20/2014 0229   K 4.8 10/07/2017 1229   K 5.2 (H) 11/20/2014 1024   CL 106 09/02/2017 1105   CL 104 11/20/2014 0229   CO2 24 09/02/2017 1105   CO2 32 11/20/2014 0229   GLUCOSE 85 10/07/2017 1229   GLUCOSE 211 (H) 11/20/2014 0229   BUN 50 (H) 09/02/2017 1105   BUN 43 (H) 11/20/2014 0229   CREATININE 3.96 (H) 09/02/2017 1105   CREATININE 2.75 (H) 11/20/2014 0229   CALCIUM 9.0 09/02/2017 1105   CALCIUM 8.3 (L) 11/20/2014 0229   GFRNONAA 14 (L) 09/02/2017 1105   GFRNONAA 24 (L) 11/20/2014 0229   GFRNONAA 21 (L) 04/09/2014 0050   GFRAA 16 (L) 09/02/2017 1105   GFRAA 30 (L) 11/20/2014 0229   GFRAA 24 (L) 04/09/2014 0050   CrCl cannot be calculated (Patient's most recent lab result is older than the maximum 21 days allowed.).  COAG Lab Results  Component Value Date   INR 1.86 09/02/2017   INR 1.93 05/29/2017   INR 1.55 05/23/2016    Radiology No  results found.   Assessment/Plan 1.  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  Peritoneal dialysis catheter to be placed today.  Risks and benefits discussed. 2.  Coronary disease/arrhythmias.  Status post multiple procedures including AICD placement.  Monitor telemetry throughout the procedure. 3.  Hypertension.  Stable on outpatient medications and blood pressure control important in reducing the progression of atherosclerotic disease. On appropriate oral medications. 4.  Diabetes.  Stable on outpatient medications and blood glucose control important in reducing the progression of atherosclerotic disease. Also, involved in wound healing. On appropriate medications.    Leotis Pain, MD  10/07/2017 1:58 PM

## 2017-10-07 NOTE — Anesthesia Post-op Follow-up Note (Signed)
Anesthesia QCDR form completed.        

## 2017-10-07 NOTE — Op Note (Signed)
  OPERATIVE NOTE   PROCEDURE: 1. Laparoscopic peritoneal dialysis catheter placement.  PRE-OPERATIVE DIAGNOSIS: 1. CKD stage V   POST-OPERATIVE DIAGNOSIS: Same  SURGEON: Leotis Pain, MD  ASSISTANT(S): None  ANESTHESIA: general  ESTIMATED BLOOD LOSS: 3 cc  FINDING(S): 1. None  SPECIMEN(S): None  INDICATIONS:  Patient presents with advanced renal failure. The patient has decided to do peritoneal dialysis for his long-term dialysis. Risks and benefits of placement were discussed and he is agreeable to proceed.  Differences between peritoneal dialysis and hemodialysis were discussed.    DESCRIPTION: After obtaining full informed written consent, the patient was brought back to the operating room and placed supine upon the operating table. The patient received IV antibiotics prior to induction. After obtaining adequate anesthesia, the abdomen was prepped and draped in the standard fashion. A small transverse incision was created just to the left of the umbilicus and we dissected down to the fascia and placed a pursestring Vicryl suture. I then entered the peritoneum with an 89mm Optiview trocar placed in the right upper quadrant and insufflated the abdomen with carbon dioxide. I then entered the peritoneum just beside the umbilicus with a trocar and the peritoneal dialysis catheter under direct visualization. The coiled portion of the catheter was parked into the pelvis under direct laparoscopic guidance. The deep cuff was secured to the fascial pursestring suture. A small counterincision was made in the left abdomen and the catheter was brought out this site. The appropriate distal connectors were placed, and I then placed 500 cc of saline through the catheter into the pelvis. The abdomen was desufflated. Immediately, over 450 cc of effluent returned through the catheter when the bag was placed to gravity. I took one more look with the camera to ensure that the catheter was in the pelvis  and it was. The 69mm trocar was then removed. I then closed the incisions with 3-0 Vicryl and 4-0 Monocryl and placed Dermabond as dressing. Dry dressing was placed around the catheter exit site. The patient was then awakened from anesthesia and taken to the recovery room in stable condition having tolerated the procedure well.  COMPLICATIONS: None  CONDITION: None  Leotis Pain, MD 10/07/2017 3:25 PM   This note was created with Dragon Medical transcription system. Any errors in dictation are purely unintentional.

## 2017-10-07 NOTE — Discharge Instructions (Signed)

## 2017-10-07 NOTE — Anesthesia Preprocedure Evaluation (Addendum)
Anesthesia Evaluation  Patient identified by MRN, date of birth, ID band Patient awake    Reviewed: Allergy & Precautions, NPO status , Patient's Chart, lab work & pertinent test results  Airway Mallampati: II  TM Distance: <3 FB     Dental  (+) Caps, Chipped   Pulmonary neg pulmonary ROS,    Pulmonary exam normal        Cardiovascular hypertension, Pt. on medications + CAD, + Past MI and +CHF  Normal cardiovascular exam+ dysrhythmias Atrial Fibrillation + Cardiac Defibrillator      Neuro/Psych PSYCHIATRIC DISORDERS Anxiety negative neurological ROS     GI/Hepatic hiatal hernia, (+) Hepatitis -, C  Endo/Other  diabetes, Well Controlled, Type 2, Oral Hypoglycemic Agents  Renal/GU ESRF and DialysisRenal disease  negative genitourinary   Musculoskeletal   Abdominal Normal abdominal exam  (+)   Peds negative pediatric ROS (+)  Hematology  (+) anemia ,   Anesthesia Other Findings Past Medical History: No date: AICD (automatic cardioverter/defibrillator) present     Comment:  Defib.   [employer] No date: Anxiety No date: Atrial fibrillation ([facility]) No date: CHF (congestive heart failure) ([facility]) No date: Coronary artery disease No date: Diabetes mellitus without complication ([place]) No date: Hepatitis C 1970: History of hiatal hernia No date: HIV disease ([place]) No date: Hypertension 2003: Myocardial infarction ([place]) No date: PTSD (post-traumatic stress disorder) No date: Renal failure  Reproductive/Obstetrics                            Anesthesia Physical Anesthesia Plan  ASA: III  Anesthesia Plan: General   Post-op Pain Management:    Induction: Intravenous  PONV Risk Score and Plan:   Airway Management Planned: Oral ETT  Additional Equipment:   Intra-op Plan:   Post-operative Plan: Extubation in OR  Informed Consent: I have reviewed the patients History and Physical,  chart, labs and discussed the procedure including the risks, benefits and alternatives for the proposed anesthesia with the patient or authorized representative who has indicated his/her understanding and acceptance.   Dental advisory given  Plan Discussed with: CRNA and Surgeon  Anesthesia Plan Comments: (Talked with patient about his increased risks, including but not limited to post -op ventilation, MI, post-op arrythmias.  Patient appears to understand and agree.)       Anesthesia Quick Evaluation

## 2017-10-07 NOTE — Transfer of Care (Signed)
Immediate Anesthesia Transfer of Care Note  Patient: Robert Bentley  Procedure(s) Performed: LAPAROSCOPIC INSERTION CONTINUOUS AMBULATORY PERITONEAL DIALYSIS  (CAPD) CATHETER (Left )  Patient Location: PACU  Anesthesia Type:General  Level of Consciousness: sedated and responds to stimulation  Airway & Oxygen Therapy: Patient Spontanous Breathing and Patient connected to face mask oxygen  Post-op Assessment: Report given to RN and Post -op Vital signs reviewed and stable  Post vital signs: Reviewed and stable  Last Vitals:  Vitals:   10/07/17 1153 10/07/17 1520  BP: (!) 139/98 (!) 148/89  Pulse: 89   Resp: 18 (!) 24  Temp: (!) 36.1 C   SpO2: 99%     Last Pain:  Vitals:   10/07/17 1153  TempSrc: Tympanic  PainSc: 5          Complications: No apparent anesthesia complications

## 2017-10-07 NOTE — Anesthesia Postprocedure Evaluation (Signed)
Anesthesia Post Note  Patient: Robert Bentley  Procedure(s) Performed: LAPAROSCOPIC INSERTION CONTINUOUS AMBULATORY PERITONEAL DIALYSIS  (CAPD) CATHETER (Left )  Patient location during evaluation: PACU Anesthesia Type: General Level of consciousness: awake and alert and oriented Pain management: pain level controlled Vital Signs Assessment: post-procedure vital signs reviewed and stable Respiratory status: spontaneous breathing, nonlabored ventilation and respiratory function stable Cardiovascular status: blood pressure returned to baseline and stable Postop Assessment: no signs of nausea or vomiting Anesthetic complications: no     Last Vitals:  Vitals:   10/07/17 1605 10/07/17 1626  BP: (!) 150/97 (!) 157/86  Pulse: 79 80  Resp: 20 18  Temp:  (!) 36.3 C  SpO2: 100% 100%    Last Pain:  Vitals:   10/07/17 1626  TempSrc: Temporal  PainSc: 8                  Eian Vandervelden

## 2017-10-07 NOTE — Anesthesia Procedure Notes (Signed)
Procedure Name: Intubation Performed by: Lance Muss, CRNA Pre-anesthesia Checklist: Patient identified, Patient being monitored, Timeout performed, Emergency Drugs available and Suction available Patient Re-evaluated:Patient Re-evaluated prior to induction Oxygen Delivery Method: Circle system utilized Preoxygenation: Pre-oxygenation with 100% oxygen Induction Type: IV induction Ventilation: Mask ventilation without difficulty Laryngoscope Size: Mac and 3 Grade View: Grade I Tube type: Oral Tube size: 7.5 mm Number of attempts: 1 Airway Equipment and Method: Stylet Placement Confirmation: ETT inserted through vocal cords under direct vision,  positive ETCO2 and breath sounds checked- equal and bilateral Secured at: 22 cm Tube secured with: Tape Dental Injury: Teeth and Oropharynx as per pre-operative assessment

## 2017-10-07 NOTE — Telephone Encounter (Signed)
Patient called stating that his blood sugar was 61 and he was wondering what he should do since he is having a PD Cath place today @12 :30.I spoke with KS and she advise for the patient to take a sip or two of orange or apple juice.The stated that the juices does not agree with him so he was going to place a mint in his mouth;KS advise for the patient to take a small piece  or place a little sugar under his tongue then recheck his sugar level in 5 minutes

## 2017-10-08 ENCOUNTER — Telehealth (INDEPENDENT_AMBULATORY_CARE_PROVIDER_SITE_OTHER): Payer: Self-pay

## 2017-10-08 ENCOUNTER — Encounter: Payer: Self-pay | Admitting: Vascular Surgery

## 2017-10-08 NOTE — Telephone Encounter (Signed)
Patient wife called stating her husband had a procedure yesterday and ever since he left the hospital he had been dry heaving all through the night to this morning.The patient had took a 8mg  Zofran at the hospital and one this morning.I spoke with KS and she wanted to talk to one of the doctors but the patient ended up calling stating that the dry heaving had stop and he was feeling better.

## 2017-10-09 DIAGNOSIS — E875 Hyperkalemia: Secondary | ICD-10-CM | POA: Diagnosis not present

## 2017-10-09 DIAGNOSIS — R600 Localized edema: Secondary | ICD-10-CM | POA: Diagnosis not present

## 2017-10-09 DIAGNOSIS — N183 Chronic kidney disease, stage 3 (moderate): Secondary | ICD-10-CM | POA: Diagnosis not present

## 2017-10-09 DIAGNOSIS — R809 Proteinuria, unspecified: Secondary | ICD-10-CM | POA: Diagnosis not present

## 2017-10-09 DIAGNOSIS — D631 Anemia in chronic kidney disease: Secondary | ICD-10-CM | POA: Diagnosis not present

## 2017-10-23 DIAGNOSIS — N186 End stage renal disease: Secondary | ICD-10-CM | POA: Diagnosis not present

## 2017-10-23 DIAGNOSIS — B20 Human immunodeficiency virus [HIV] disease: Secondary | ICD-10-CM | POA: Diagnosis not present

## 2017-10-23 DIAGNOSIS — Z992 Dependence on renal dialysis: Secondary | ICD-10-CM | POA: Diagnosis not present

## 2017-10-23 DIAGNOSIS — I739 Peripheral vascular disease, unspecified: Secondary | ICD-10-CM | POA: Diagnosis not present

## 2017-10-26 DIAGNOSIS — I10 Essential (primary) hypertension: Secondary | ICD-10-CM | POA: Diagnosis not present

## 2017-10-26 DIAGNOSIS — N2581 Secondary hyperparathyroidism of renal origin: Secondary | ICD-10-CM | POA: Diagnosis not present

## 2017-10-26 DIAGNOSIS — E1122 Type 2 diabetes mellitus with diabetic chronic kidney disease: Secondary | ICD-10-CM | POA: Diagnosis not present

## 2017-10-26 DIAGNOSIS — D631 Anemia in chronic kidney disease: Secondary | ICD-10-CM | POA: Diagnosis not present

## 2017-10-26 DIAGNOSIS — N185 Chronic kidney disease, stage 5: Secondary | ICD-10-CM | POA: Diagnosis not present

## 2017-11-03 ENCOUNTER — Encounter (INDEPENDENT_AMBULATORY_CARE_PROVIDER_SITE_OTHER): Payer: Self-pay | Admitting: Vascular Surgery

## 2017-11-03 ENCOUNTER — Ambulatory Visit (INDEPENDENT_AMBULATORY_CARE_PROVIDER_SITE_OTHER): Payer: Medicare Other | Admitting: Vascular Surgery

## 2017-11-03 VITALS — BP 134/70 | HR 73 | Resp 16 | Wt 177.0 lb

## 2017-11-03 DIAGNOSIS — I10 Essential (primary) hypertension: Secondary | ICD-10-CM

## 2017-11-03 DIAGNOSIS — E119 Type 2 diabetes mellitus without complications: Secondary | ICD-10-CM

## 2017-11-03 DIAGNOSIS — N185 Chronic kidney disease, stage 5: Secondary | ICD-10-CM

## 2017-11-03 NOTE — Progress Notes (Signed)
Subjective:    Patient ID: Robert Bentley, male    DOB: 12-24-1940, 77 y.o.   MRN: [id number] Chief Complaint  Patient presents with  . Wound Check    [facility] 2wk follow up   Patient presents for his first postoperative follow-up.  The patient is status post insertion of a peritoneal dialysis catheter on October 07, 2017.  The patient's postoperative course has been unremarkable.  The patient has been receiving flushes by [facility] staff.  The patient denies any problems with incision sites.  The patient denies any abdominal pain, nausea or vomiting.  The patient has not started dialysis as of yet.  The patient denies any postoperative fevers.   Review of Systems  Constitutional: Negative.   HENT: Negative.   Eyes: Negative.   Respiratory: Negative.   Cardiovascular: Negative.   Gastrointestinal: Negative.   Endocrine: Negative.   Genitourinary: Negative.   Musculoskeletal: Negative.   Skin: Negative.   Allergic/Immunologic: Negative.   Neurological: Negative.   Hematological: Negative.   Psychiatric/Behavioral: Negative.       Objective:   Physical Exam  Constitutional: He is oriented to person, place, and time. He appears well-developed and well-nourished. No distress.  HENT:  Head: Normocephalic and atraumatic.  Eyes: Conjunctivae are normal. Pupils are equal, round, and reactive to light.  Neck: Normal range of motion.  Cardiovascular: Normal rate, regular rhythm, normal heart sounds and intact distal pulses.  Pulses:      Radial pulses are 2+ on the right side, and 2+ on the left side.  Pulmonary/Chest: Effort normal and breath sounds normal.  Abdominal: Soft. Bowel sounds are normal. He exhibits no distension. There is no tenderness. There is no rebound.  Laparoscopic incisions are healed. Peritoneal dialysis tract is healing.  There is no surrounding erythema.  There is no drainage.  Musculoskeletal: Normal range of motion. He exhibits no edema.  Neurological:  He is alert and oriented to person, place, and time.  Skin: Skin is warm and dry. He is not diaphoretic.  Psychiatric: He has a normal mood and affect. His behavior is normal. Judgment and thought content normal.  Vitals reviewed.  BP 134/70 (BP Location: Right Arm)   Pulse 73   Resp 16   Wt 177 lb (80.3 kg)   BMI 23.35 kg/m   Past Medical History:  Diagnosis Date  . AICD (automatic cardioverter/defibrillator) present    defib.  Marland Kitchen Anxiety   . Atrial fibrillation ([place])   . CHF (congestive heart failure) ([place])   . Coronary artery disease   . Diabetes mellitus without complication ([place])   . Hepatitis C   . History of hiatal hernia 1970  . HIV disease ([place])   . Hypertension   . Myocardial infarction ([place]) 2003  . PTSD (post-traumatic stress disorder)   . Renal failure    Social History   Socioeconomic History  . Marital status: Married    Spouse name: Not on file  . Number of children: Not on file  . Years of education: Not on file  . Highest education level: Not on file  Social Needs  . Financial resource strain: Not on file  . Food insecurity - worry: Not on file  . Food insecurity - inability: Not on file  . Transportation needs - medical: Not on file  . Transportation needs - non-medical: Not on file  Occupational History  . Not on file  Tobacco Use  . Smoking status: Never Smoker  . Smokeless tobacco:  Never Used  Substance and Sexual Activity  . Alcohol use: No  . Drug use: No  . Sexual activity: Not on file  Other Topics Concern  . Not on file  Social History Narrative  . Not on file   Past Surgical History:  Procedure Laterality Date  . APPENDECTOMY    . CAPD INSERTION Left 10/07/2017   Procedure: LAPAROSCOPIC INSERTION CONTINUOUS AMBULATORY PERITONEAL DIALYSIS  (CAPD) CATHETER;  Surgeon: Algernon Huxley, MD;  Location: [facility] ORS;  Service: Vascular;  Laterality: Left;  . cardiac stenting    . CHOLECYSTECTOMY    . EP IMPLANTABLE DEVICE    . FRACTURE  SURGERY Right 1964   leg   Family History  Problem Relation Age of Onset  . Heart failure Mother   . Diabetes Mother    Allergies  Allergen Reactions  . Morphine Nausea And Vomiting, Rash and Other (See Comments)    Reactions: "gets violent"  . Efavirenz Other (See Comments)    Reaction:  Unknown   . Statins Hives  . Sulfamethizole Other (See Comments)    fever       Assessment & Plan:  Patient presents for his first postoperative follow-up.  The patient is status post insertion of a peritoneal dialysis catheter on October 07, 2017.  The patient's postoperative course has been unremarkable.  The patient has been receiving flushes by [facility] staff.  The patient denies any problems with incision sites.  The patient denies any abdominal pain, nausea or vomiting.  The patient has not started dialysis as of yet.  The patient denies any postoperative fevers.  1. CKD (chronic kidney disease) stage 5, GFR less than 15 ml/min [facility]) s/p peritoneal dialysis catheter creation on October 07, 2017 - New Patient with an unremarkable postoperative course Patient's incisions are healing well Okay to start using the patient's peritoneal dialysis catheter for dialysis if needed.  If not needed the patient should continue to flush the catheter on a regular basis I called [facility] in [place] left a message for the patient to receive a peritoneal dialysis catheter belt so that his catheter does not need to be taped to his abdomen Patient to follow-up with [place] as needed  2. Diabetes mellitus without complication ([place]) - Stable Encouraged good control as its slows the progression of atherosclerotic disease  3. Essential hypertension - Stable Encouraged good control as its slows the progression of atherosclerotic disease  Current Outpatient Medications on File Prior to Visit  Medication Sig Dispense Refill  . ALPRAZolam (XANAX) 0.5 MG tablet Take 0.5 mg daily as needed by mouth for anxiety.     .  bisacodyl (DULCOLAX) 5 MG EC tablet Take 15 mg at bedtime by mouth.     . calcitRIOL (ROCALTROL) 0.25 MCG capsule Take 0.25 mcg daily by mouth.    . Cholecalciferol (VITAMIN D3) 2000 UNITS TABS Take 2,000 Units daily by mouth.     . diltiazem (DILACOR XR) 240 MG 24 hr capsule Take 240 mg by mouth daily.    Marland Kitchen docusate sodium (COLACE) 100 MG capsule Take 100 mg at bedtime by mouth.    . dolutegravir (TIVICAY) 50 MG tablet Take 50 mg by mouth daily.    . furosemide (LASIX) 80 MG tablet Take 80 mg 2 (two) times daily by mouth.     Marland Kitchen glimepiride (AMARYL) 2 MG tablet Take 2 mg 3 (three) times daily as needed by mouth (for high blood sugar is 150 or higher).     Marland Kitchen  HYDROcodone-acetaminophen (NORCO) 10-325 MG per tablet Take 1 tablet 2 (two) times daily as needed by mouth for severe pain.     Marland Kitchen HYDROcodone-acetaminophen (NORCO) 10-325 MG tablet Take 1-2 tablets by mouth every 6 (six) hours as needed. 30 tablet 0  . isosorbide mononitrate (IMDUR) 60 MG 24 hr tablet Take 30 mg daily by mouth.    . lactulose (CHRONULAC) 10 GM/15ML solution Take 20 g 2 (two) times daily as needed by mouth for mild constipation.    Marland Kitchen lamiVUDine (EPIVIR) 150 MG tablet Take 150 mg by mouth daily.    Marland Kitchen latanoprost (XALATAN) 0.005 % ophthalmic solution INSTILL ONE DROP TO BOTH EYES EVERY NIGHT  6  . Multiple Vitamins-Iron (MULTIVITAMINS WITH IRON) TABS tablet Take 1 tablet by mouth daily.    . ondansetron (ZOFRAN) 8 MG tablet Take 8 mg by mouth every 8 (eight) hours as needed for nausea or vomiting.    . sodium bicarbonate 650 MG tablet Take 1,300 mg by mouth 2 (two) times daily.     . sodium polystyrene (KAYEXALATE) powder 8 teaspoons mixed with water twice daily as needed for potassium    . timolol (BETIMOL) 0.5 % ophthalmic solution Place 1 drop into both eyes at bedtime.    . vitamin B-12 (CYANOCOBALAMIN) 500 MCG tablet Take 500 mcg daily by mouth.    . warfarin (COUMADIN) 5 MG tablet Take 5 mg every evening by mouth.   3    No current facility-administered medications on file prior to visit.    There are no Patient Instructions on file for this visit. No Follow-up on file.  Almyra Birman A Ameirah Khatoon, PA-C

## 2017-11-06 DIAGNOSIS — D631 Anemia in chronic kidney disease: Secondary | ICD-10-CM | POA: Diagnosis not present

## 2017-11-06 DIAGNOSIS — I10 Essential (primary) hypertension: Secondary | ICD-10-CM | POA: Diagnosis not present

## 2017-11-06 DIAGNOSIS — E1122 Type 2 diabetes mellitus with diabetic chronic kidney disease: Secondary | ICD-10-CM | POA: Diagnosis not present

## 2017-11-06 DIAGNOSIS — N2581 Secondary hyperparathyroidism of renal origin: Secondary | ICD-10-CM | POA: Diagnosis not present

## 2017-11-06 DIAGNOSIS — N185 Chronic kidney disease, stage 5: Secondary | ICD-10-CM | POA: Diagnosis not present

## 2017-11-09 DIAGNOSIS — N186 End stage renal disease: Secondary | ICD-10-CM | POA: Diagnosis not present

## 2017-11-09 DIAGNOSIS — E119 Type 2 diabetes mellitus without complications: Secondary | ICD-10-CM | POA: Diagnosis not present

## 2017-11-09 DIAGNOSIS — Z1159 Encounter for screening for other viral diseases: Secondary | ICD-10-CM | POA: Diagnosis not present

## 2017-11-09 DIAGNOSIS — D509 Iron deficiency anemia, unspecified: Secondary | ICD-10-CM | POA: Diagnosis not present

## 2017-11-09 DIAGNOSIS — Z79899 Other long term (current) drug therapy: Secondary | ICD-10-CM | POA: Diagnosis not present

## 2017-11-09 DIAGNOSIS — Z992 Dependence on renal dialysis: Secondary | ICD-10-CM | POA: Diagnosis not present

## 2017-11-09 DIAGNOSIS — E785 Hyperlipidemia, unspecified: Secondary | ICD-10-CM | POA: Diagnosis not present

## 2017-11-09 DIAGNOSIS — N2581 Secondary hyperparathyroidism of renal origin: Secondary | ICD-10-CM | POA: Diagnosis not present

## 2017-11-11 DIAGNOSIS — N186 End stage renal disease: Secondary | ICD-10-CM | POA: Diagnosis not present

## 2017-11-11 DIAGNOSIS — D509 Iron deficiency anemia, unspecified: Secondary | ICD-10-CM | POA: Diagnosis not present

## 2017-11-11 DIAGNOSIS — N2581 Secondary hyperparathyroidism of renal origin: Secondary | ICD-10-CM | POA: Diagnosis not present

## 2017-11-11 DIAGNOSIS — Z992 Dependence on renal dialysis: Secondary | ICD-10-CM | POA: Diagnosis not present

## 2017-11-13 DIAGNOSIS — Z992 Dependence on renal dialysis: Secondary | ICD-10-CM | POA: Diagnosis not present

## 2017-11-13 DIAGNOSIS — N2581 Secondary hyperparathyroidism of renal origin: Secondary | ICD-10-CM | POA: Diagnosis not present

## 2017-11-13 DIAGNOSIS — N186 End stage renal disease: Secondary | ICD-10-CM | POA: Diagnosis not present

## 2017-11-13 DIAGNOSIS — D509 Iron deficiency anemia, unspecified: Secondary | ICD-10-CM | POA: Diagnosis not present

## 2017-11-16 DIAGNOSIS — Z992 Dependence on renal dialysis: Secondary | ICD-10-CM | POA: Diagnosis not present

## 2017-11-16 DIAGNOSIS — D509 Iron deficiency anemia, unspecified: Secondary | ICD-10-CM | POA: Diagnosis not present

## 2017-11-16 DIAGNOSIS — N186 End stage renal disease: Secondary | ICD-10-CM | POA: Diagnosis not present

## 2017-11-16 DIAGNOSIS — N2581 Secondary hyperparathyroidism of renal origin: Secondary | ICD-10-CM | POA: Diagnosis not present

## 2017-11-17 DIAGNOSIS — Z992 Dependence on renal dialysis: Secondary | ICD-10-CM | POA: Diagnosis not present

## 2017-11-17 DIAGNOSIS — N2581 Secondary hyperparathyroidism of renal origin: Secondary | ICD-10-CM | POA: Diagnosis not present

## 2017-11-17 DIAGNOSIS — D509 Iron deficiency anemia, unspecified: Secondary | ICD-10-CM | POA: Diagnosis not present

## 2017-11-17 DIAGNOSIS — N186 End stage renal disease: Secondary | ICD-10-CM | POA: Diagnosis not present

## 2017-11-18 DIAGNOSIS — N186 End stage renal disease: Secondary | ICD-10-CM | POA: Diagnosis not present

## 2017-11-18 DIAGNOSIS — Z992 Dependence on renal dialysis: Secondary | ICD-10-CM | POA: Diagnosis not present

## 2017-11-19 DIAGNOSIS — Z992 Dependence on renal dialysis: Secondary | ICD-10-CM | POA: Diagnosis not present

## 2017-11-19 DIAGNOSIS — N186 End stage renal disease: Secondary | ICD-10-CM | POA: Diagnosis not present

## 2017-11-20 DIAGNOSIS — E8881 Metabolic syndrome: Secondary | ICD-10-CM | POA: Diagnosis not present

## 2017-11-20 DIAGNOSIS — B20 Human immunodeficiency virus [HIV] disease: Secondary | ICD-10-CM | POA: Diagnosis not present

## 2017-11-20 DIAGNOSIS — Z992 Dependence on renal dialysis: Secondary | ICD-10-CM | POA: Diagnosis not present

## 2017-11-20 DIAGNOSIS — I509 Heart failure, unspecified: Secondary | ICD-10-CM | POA: Diagnosis not present

## 2017-11-30 DIAGNOSIS — Z992 Dependence on renal dialysis: Secondary | ICD-10-CM | POA: Diagnosis not present

## 2017-11-30 DIAGNOSIS — N186 End stage renal disease: Secondary | ICD-10-CM | POA: Diagnosis not present

## 2017-12-01 DIAGNOSIS — Z992 Dependence on renal dialysis: Secondary | ICD-10-CM | POA: Diagnosis not present

## 2017-12-01 DIAGNOSIS — N186 End stage renal disease: Secondary | ICD-10-CM | POA: Diagnosis not present

## 2017-12-02 DIAGNOSIS — Z992 Dependence on renal dialysis: Secondary | ICD-10-CM | POA: Diagnosis not present

## 2017-12-02 DIAGNOSIS — N186 End stage renal disease: Secondary | ICD-10-CM | POA: Diagnosis not present

## 2017-12-03 ENCOUNTER — Other Ambulatory Visit (INDEPENDENT_AMBULATORY_CARE_PROVIDER_SITE_OTHER): Payer: Self-pay | Admitting: Vascular Surgery

## 2017-12-03 DIAGNOSIS — Z992 Dependence on renal dialysis: Secondary | ICD-10-CM | POA: Diagnosis not present

## 2017-12-03 DIAGNOSIS — N186 End stage renal disease: Secondary | ICD-10-CM | POA: Diagnosis not present

## 2017-12-03 DIAGNOSIS — I6523 Occlusion and stenosis of bilateral carotid arteries: Secondary | ICD-10-CM

## 2017-12-04 ENCOUNTER — Encounter (INDEPENDENT_AMBULATORY_CARE_PROVIDER_SITE_OTHER): Payer: Self-pay | Admitting: Vascular Surgery

## 2017-12-04 ENCOUNTER — Ambulatory Visit (INDEPENDENT_AMBULATORY_CARE_PROVIDER_SITE_OTHER): Payer: Medicare Other | Admitting: Vascular Surgery

## 2017-12-04 ENCOUNTER — Encounter (INDEPENDENT_AMBULATORY_CARE_PROVIDER_SITE_OTHER): Payer: Self-pay

## 2017-12-04 ENCOUNTER — Ambulatory Visit (INDEPENDENT_AMBULATORY_CARE_PROVIDER_SITE_OTHER): Payer: Medicare Other

## 2017-12-04 VITALS — BP 141/73 | HR 71 | Resp 14 | Ht 73.0 in | Wt 171.0 lb

## 2017-12-04 DIAGNOSIS — I6523 Occlusion and stenosis of bilateral carotid arteries: Secondary | ICD-10-CM | POA: Diagnosis not present

## 2017-12-04 DIAGNOSIS — N185 Chronic kidney disease, stage 5: Secondary | ICD-10-CM

## 2017-12-04 NOTE — Progress Notes (Signed)
Patient left before being seen.

## 2017-12-07 DIAGNOSIS — Z992 Dependence on renal dialysis: Secondary | ICD-10-CM | POA: Diagnosis not present

## 2017-12-07 DIAGNOSIS — N186 End stage renal disease: Secondary | ICD-10-CM | POA: Diagnosis not present

## 2017-12-08 DIAGNOSIS — D509 Iron deficiency anemia, unspecified: Secondary | ICD-10-CM | POA: Diagnosis not present

## 2017-12-08 DIAGNOSIS — Z992 Dependence on renal dialysis: Secondary | ICD-10-CM | POA: Diagnosis not present

## 2017-12-08 DIAGNOSIS — N2581 Secondary hyperparathyroidism of renal origin: Secondary | ICD-10-CM | POA: Diagnosis not present

## 2017-12-08 DIAGNOSIS — N186 End stage renal disease: Secondary | ICD-10-CM | POA: Diagnosis not present

## 2017-12-09 ENCOUNTER — Encounter (INDEPENDENT_AMBULATORY_CARE_PROVIDER_SITE_OTHER): Payer: Self-pay | Admitting: *Deleted

## 2017-12-09 ENCOUNTER — Encounter (INDEPENDENT_AMBULATORY_CARE_PROVIDER_SITE_OTHER): Payer: Self-pay

## 2017-12-09 DIAGNOSIS — N186 End stage renal disease: Secondary | ICD-10-CM | POA: Diagnosis not present

## 2017-12-09 DIAGNOSIS — N2581 Secondary hyperparathyroidism of renal origin: Secondary | ICD-10-CM | POA: Diagnosis not present

## 2017-12-09 DIAGNOSIS — D509 Iron deficiency anemia, unspecified: Secondary | ICD-10-CM | POA: Diagnosis not present

## 2017-12-09 DIAGNOSIS — Z992 Dependence on renal dialysis: Secondary | ICD-10-CM | POA: Diagnosis not present

## 2017-12-10 DIAGNOSIS — Z992 Dependence on renal dialysis: Secondary | ICD-10-CM | POA: Diagnosis not present

## 2017-12-10 DIAGNOSIS — N186 End stage renal disease: Secondary | ICD-10-CM | POA: Diagnosis not present

## 2017-12-10 DIAGNOSIS — D509 Iron deficiency anemia, unspecified: Secondary | ICD-10-CM | POA: Diagnosis not present

## 2017-12-10 DIAGNOSIS — N2581 Secondary hyperparathyroidism of renal origin: Secondary | ICD-10-CM | POA: Diagnosis not present

## 2017-12-11 DIAGNOSIS — D509 Iron deficiency anemia, unspecified: Secondary | ICD-10-CM | POA: Diagnosis not present

## 2017-12-11 DIAGNOSIS — Z992 Dependence on renal dialysis: Secondary | ICD-10-CM | POA: Diagnosis not present

## 2017-12-11 DIAGNOSIS — N2581 Secondary hyperparathyroidism of renal origin: Secondary | ICD-10-CM | POA: Diagnosis not present

## 2017-12-11 DIAGNOSIS — N186 End stage renal disease: Secondary | ICD-10-CM | POA: Diagnosis not present

## 2017-12-14 DIAGNOSIS — R079 Chest pain, unspecified: Secondary | ICD-10-CM | POA: Diagnosis not present

## 2017-12-14 DIAGNOSIS — B182 Chronic viral hepatitis C: Secondary | ICD-10-CM | POA: Diagnosis not present

## 2017-12-14 DIAGNOSIS — I509 Heart failure, unspecified: Secondary | ICD-10-CM | POA: Diagnosis not present

## 2017-12-14 DIAGNOSIS — Z992 Dependence on renal dialysis: Secondary | ICD-10-CM | POA: Diagnosis not present

## 2017-12-15 DIAGNOSIS — N186 End stage renal disease: Secondary | ICD-10-CM | POA: Diagnosis not present

## 2017-12-15 DIAGNOSIS — Z992 Dependence on renal dialysis: Secondary | ICD-10-CM | POA: Diagnosis not present

## 2017-12-15 DIAGNOSIS — D509 Iron deficiency anemia, unspecified: Secondary | ICD-10-CM | POA: Diagnosis not present

## 2017-12-15 DIAGNOSIS — N2581 Secondary hyperparathyroidism of renal origin: Secondary | ICD-10-CM | POA: Diagnosis not present

## 2017-12-16 DIAGNOSIS — Z992 Dependence on renal dialysis: Secondary | ICD-10-CM | POA: Diagnosis not present

## 2017-12-16 DIAGNOSIS — Z79899 Other long term (current) drug therapy: Secondary | ICD-10-CM | POA: Diagnosis not present

## 2017-12-16 DIAGNOSIS — N2581 Secondary hyperparathyroidism of renal origin: Secondary | ICD-10-CM | POA: Diagnosis not present

## 2017-12-16 DIAGNOSIS — N186 End stage renal disease: Secondary | ICD-10-CM | POA: Diagnosis not present

## 2017-12-16 DIAGNOSIS — D509 Iron deficiency anemia, unspecified: Secondary | ICD-10-CM | POA: Diagnosis not present

## 2017-12-17 DIAGNOSIS — D509 Iron deficiency anemia, unspecified: Secondary | ICD-10-CM | POA: Diagnosis not present

## 2017-12-17 DIAGNOSIS — Z992 Dependence on renal dialysis: Secondary | ICD-10-CM | POA: Diagnosis not present

## 2017-12-17 DIAGNOSIS — N186 End stage renal disease: Secondary | ICD-10-CM | POA: Diagnosis not present

## 2017-12-17 DIAGNOSIS — N2581 Secondary hyperparathyroidism of renal origin: Secondary | ICD-10-CM | POA: Diagnosis not present

## 2017-12-18 DIAGNOSIS — Z992 Dependence on renal dialysis: Secondary | ICD-10-CM | POA: Diagnosis not present

## 2017-12-18 DIAGNOSIS — N2581 Secondary hyperparathyroidism of renal origin: Secondary | ICD-10-CM | POA: Diagnosis not present

## 2017-12-18 DIAGNOSIS — D509 Iron deficiency anemia, unspecified: Secondary | ICD-10-CM | POA: Diagnosis not present

## 2017-12-18 DIAGNOSIS — N186 End stage renal disease: Secondary | ICD-10-CM | POA: Diagnosis not present

## 2017-12-19 DIAGNOSIS — N2581 Secondary hyperparathyroidism of renal origin: Secondary | ICD-10-CM | POA: Diagnosis not present

## 2017-12-19 DIAGNOSIS — D509 Iron deficiency anemia, unspecified: Secondary | ICD-10-CM | POA: Diagnosis not present

## 2017-12-19 DIAGNOSIS — Z992 Dependence on renal dialysis: Secondary | ICD-10-CM | POA: Diagnosis not present

## 2017-12-19 DIAGNOSIS — N186 End stage renal disease: Secondary | ICD-10-CM | POA: Diagnosis not present

## 2017-12-20 DIAGNOSIS — N2581 Secondary hyperparathyroidism of renal origin: Secondary | ICD-10-CM | POA: Diagnosis not present

## 2017-12-20 DIAGNOSIS — N186 End stage renal disease: Secondary | ICD-10-CM | POA: Diagnosis not present

## 2017-12-20 DIAGNOSIS — D509 Iron deficiency anemia, unspecified: Secondary | ICD-10-CM | POA: Diagnosis not present

## 2017-12-20 DIAGNOSIS — Z992 Dependence on renal dialysis: Secondary | ICD-10-CM | POA: Diagnosis not present

## 2017-12-21 DIAGNOSIS — Z992 Dependence on renal dialysis: Secondary | ICD-10-CM | POA: Diagnosis not present

## 2017-12-21 DIAGNOSIS — N2581 Secondary hyperparathyroidism of renal origin: Secondary | ICD-10-CM | POA: Diagnosis not present

## 2017-12-21 DIAGNOSIS — N186 End stage renal disease: Secondary | ICD-10-CM | POA: Diagnosis not present

## 2017-12-21 DIAGNOSIS — D509 Iron deficiency anemia, unspecified: Secondary | ICD-10-CM | POA: Diagnosis not present

## 2017-12-22 DIAGNOSIS — Z992 Dependence on renal dialysis: Secondary | ICD-10-CM | POA: Diagnosis not present

## 2017-12-22 DIAGNOSIS — N2581 Secondary hyperparathyroidism of renal origin: Secondary | ICD-10-CM | POA: Diagnosis not present

## 2017-12-22 DIAGNOSIS — E8881 Metabolic syndrome: Secondary | ICD-10-CM | POA: Diagnosis not present

## 2017-12-22 DIAGNOSIS — R079 Chest pain, unspecified: Secondary | ICD-10-CM | POA: Diagnosis not present

## 2017-12-22 DIAGNOSIS — N186 End stage renal disease: Secondary | ICD-10-CM | POA: Diagnosis not present

## 2017-12-22 DIAGNOSIS — D509 Iron deficiency anemia, unspecified: Secondary | ICD-10-CM | POA: Diagnosis not present

## 2017-12-22 DIAGNOSIS — I509 Heart failure, unspecified: Secondary | ICD-10-CM | POA: Diagnosis not present

## 2017-12-22 DIAGNOSIS — I255 Ischemic cardiomyopathy: Secondary | ICD-10-CM | POA: Diagnosis not present

## 2017-12-23 DIAGNOSIS — Z992 Dependence on renal dialysis: Secondary | ICD-10-CM | POA: Diagnosis not present

## 2017-12-23 DIAGNOSIS — N2581 Secondary hyperparathyroidism of renal origin: Secondary | ICD-10-CM | POA: Diagnosis not present

## 2017-12-23 DIAGNOSIS — D509 Iron deficiency anemia, unspecified: Secondary | ICD-10-CM | POA: Diagnosis not present

## 2017-12-23 DIAGNOSIS — N186 End stage renal disease: Secondary | ICD-10-CM | POA: Diagnosis not present

## 2017-12-24 DIAGNOSIS — N2581 Secondary hyperparathyroidism of renal origin: Secondary | ICD-10-CM | POA: Diagnosis not present

## 2017-12-24 DIAGNOSIS — N186 End stage renal disease: Secondary | ICD-10-CM | POA: Diagnosis not present

## 2017-12-24 DIAGNOSIS — D509 Iron deficiency anemia, unspecified: Secondary | ICD-10-CM | POA: Diagnosis not present

## 2017-12-24 DIAGNOSIS — Z992 Dependence on renal dialysis: Secondary | ICD-10-CM | POA: Diagnosis not present

## 2017-12-25 DIAGNOSIS — D509 Iron deficiency anemia, unspecified: Secondary | ICD-10-CM | POA: Diagnosis not present

## 2017-12-25 DIAGNOSIS — Z992 Dependence on renal dialysis: Secondary | ICD-10-CM | POA: Diagnosis not present

## 2017-12-25 DIAGNOSIS — N186 End stage renal disease: Secondary | ICD-10-CM | POA: Diagnosis not present

## 2017-12-25 DIAGNOSIS — N2581 Secondary hyperparathyroidism of renal origin: Secondary | ICD-10-CM | POA: Diagnosis not present

## 2017-12-26 DIAGNOSIS — Z992 Dependence on renal dialysis: Secondary | ICD-10-CM | POA: Diagnosis not present

## 2017-12-26 DIAGNOSIS — N2581 Secondary hyperparathyroidism of renal origin: Secondary | ICD-10-CM | POA: Diagnosis not present

## 2017-12-26 DIAGNOSIS — D509 Iron deficiency anemia, unspecified: Secondary | ICD-10-CM | POA: Diagnosis not present

## 2017-12-26 DIAGNOSIS — N186 End stage renal disease: Secondary | ICD-10-CM | POA: Diagnosis not present

## 2017-12-27 DIAGNOSIS — D509 Iron deficiency anemia, unspecified: Secondary | ICD-10-CM | POA: Diagnosis not present

## 2017-12-27 DIAGNOSIS — N2581 Secondary hyperparathyroidism of renal origin: Secondary | ICD-10-CM | POA: Diagnosis not present

## 2017-12-27 DIAGNOSIS — N186 End stage renal disease: Secondary | ICD-10-CM | POA: Diagnosis not present

## 2017-12-27 DIAGNOSIS — Z992 Dependence on renal dialysis: Secondary | ICD-10-CM | POA: Diagnosis not present

## 2017-12-28 DIAGNOSIS — Z992 Dependence on renal dialysis: Secondary | ICD-10-CM | POA: Diagnosis not present

## 2017-12-28 DIAGNOSIS — D509 Iron deficiency anemia, unspecified: Secondary | ICD-10-CM | POA: Diagnosis not present

## 2017-12-28 DIAGNOSIS — N186 End stage renal disease: Secondary | ICD-10-CM | POA: Diagnosis not present

## 2017-12-28 DIAGNOSIS — N2581 Secondary hyperparathyroidism of renal origin: Secondary | ICD-10-CM | POA: Diagnosis not present

## 2017-12-29 DIAGNOSIS — N2581 Secondary hyperparathyroidism of renal origin: Secondary | ICD-10-CM | POA: Diagnosis not present

## 2017-12-29 DIAGNOSIS — N186 End stage renal disease: Secondary | ICD-10-CM | POA: Diagnosis not present

## 2017-12-29 DIAGNOSIS — Z992 Dependence on renal dialysis: Secondary | ICD-10-CM | POA: Diagnosis not present

## 2017-12-29 DIAGNOSIS — D509 Iron deficiency anemia, unspecified: Secondary | ICD-10-CM | POA: Diagnosis not present

## 2017-12-30 DIAGNOSIS — D509 Iron deficiency anemia, unspecified: Secondary | ICD-10-CM | POA: Diagnosis not present

## 2017-12-30 DIAGNOSIS — N186 End stage renal disease: Secondary | ICD-10-CM | POA: Diagnosis not present

## 2017-12-30 DIAGNOSIS — Z992 Dependence on renal dialysis: Secondary | ICD-10-CM | POA: Diagnosis not present

## 2017-12-30 DIAGNOSIS — N2581 Secondary hyperparathyroidism of renal origin: Secondary | ICD-10-CM | POA: Diagnosis not present

## 2017-12-31 DIAGNOSIS — D509 Iron deficiency anemia, unspecified: Secondary | ICD-10-CM | POA: Diagnosis not present

## 2017-12-31 DIAGNOSIS — N2581 Secondary hyperparathyroidism of renal origin: Secondary | ICD-10-CM | POA: Diagnosis not present

## 2017-12-31 DIAGNOSIS — Z992 Dependence on renal dialysis: Secondary | ICD-10-CM | POA: Diagnosis not present

## 2017-12-31 DIAGNOSIS — N186 End stage renal disease: Secondary | ICD-10-CM | POA: Diagnosis not present

## 2018-01-01 DIAGNOSIS — D509 Iron deficiency anemia, unspecified: Secondary | ICD-10-CM | POA: Diagnosis not present

## 2018-01-01 DIAGNOSIS — Z992 Dependence on renal dialysis: Secondary | ICD-10-CM | POA: Diagnosis not present

## 2018-01-01 DIAGNOSIS — N2581 Secondary hyperparathyroidism of renal origin: Secondary | ICD-10-CM | POA: Diagnosis not present

## 2018-01-01 DIAGNOSIS — N186 End stage renal disease: Secondary | ICD-10-CM | POA: Diagnosis not present

## 2018-01-02 DIAGNOSIS — N2581 Secondary hyperparathyroidism of renal origin: Secondary | ICD-10-CM | POA: Diagnosis not present

## 2018-01-02 DIAGNOSIS — D509 Iron deficiency anemia, unspecified: Secondary | ICD-10-CM | POA: Diagnosis not present

## 2018-01-02 DIAGNOSIS — N186 End stage renal disease: Secondary | ICD-10-CM | POA: Diagnosis not present

## 2018-01-02 DIAGNOSIS — Z992 Dependence on renal dialysis: Secondary | ICD-10-CM | POA: Diagnosis not present

## 2018-01-03 DIAGNOSIS — D509 Iron deficiency anemia, unspecified: Secondary | ICD-10-CM | POA: Diagnosis not present

## 2018-01-03 DIAGNOSIS — N186 End stage renal disease: Secondary | ICD-10-CM | POA: Diagnosis not present

## 2018-01-03 DIAGNOSIS — N2581 Secondary hyperparathyroidism of renal origin: Secondary | ICD-10-CM | POA: Diagnosis not present

## 2018-01-03 DIAGNOSIS — Z992 Dependence on renal dialysis: Secondary | ICD-10-CM | POA: Diagnosis not present

## 2018-01-04 DIAGNOSIS — N186 End stage renal disease: Secondary | ICD-10-CM | POA: Diagnosis not present

## 2018-01-04 DIAGNOSIS — D509 Iron deficiency anemia, unspecified: Secondary | ICD-10-CM | POA: Diagnosis not present

## 2018-01-04 DIAGNOSIS — N2581 Secondary hyperparathyroidism of renal origin: Secondary | ICD-10-CM | POA: Diagnosis not present

## 2018-01-04 DIAGNOSIS — Z992 Dependence on renal dialysis: Secondary | ICD-10-CM | POA: Diagnosis not present

## 2018-01-05 DIAGNOSIS — N186 End stage renal disease: Secondary | ICD-10-CM | POA: Diagnosis not present

## 2018-01-05 DIAGNOSIS — N2581 Secondary hyperparathyroidism of renal origin: Secondary | ICD-10-CM | POA: Diagnosis not present

## 2018-01-05 DIAGNOSIS — Z992 Dependence on renal dialysis: Secondary | ICD-10-CM | POA: Diagnosis not present

## 2018-01-05 DIAGNOSIS — D509 Iron deficiency anemia, unspecified: Secondary | ICD-10-CM | POA: Diagnosis not present

## 2018-01-06 DIAGNOSIS — Z992 Dependence on renal dialysis: Secondary | ICD-10-CM | POA: Diagnosis not present

## 2018-01-06 DIAGNOSIS — D509 Iron deficiency anemia, unspecified: Secondary | ICD-10-CM | POA: Diagnosis not present

## 2018-01-06 DIAGNOSIS — N2581 Secondary hyperparathyroidism of renal origin: Secondary | ICD-10-CM | POA: Diagnosis not present

## 2018-01-06 DIAGNOSIS — N186 End stage renal disease: Secondary | ICD-10-CM | POA: Diagnosis not present

## 2018-01-07 DIAGNOSIS — N2581 Secondary hyperparathyroidism of renal origin: Secondary | ICD-10-CM | POA: Diagnosis not present

## 2018-01-07 DIAGNOSIS — Z992 Dependence on renal dialysis: Secondary | ICD-10-CM | POA: Diagnosis not present

## 2018-01-07 DIAGNOSIS — N186 End stage renal disease: Secondary | ICD-10-CM | POA: Diagnosis not present

## 2018-01-07 DIAGNOSIS — D509 Iron deficiency anemia, unspecified: Secondary | ICD-10-CM | POA: Diagnosis not present

## 2018-01-08 DIAGNOSIS — D509 Iron deficiency anemia, unspecified: Secondary | ICD-10-CM | POA: Diagnosis not present

## 2018-01-08 DIAGNOSIS — B182 Chronic viral hepatitis C: Secondary | ICD-10-CM | POA: Diagnosis not present

## 2018-01-08 DIAGNOSIS — I509 Heart failure, unspecified: Secondary | ICD-10-CM | POA: Diagnosis not present

## 2018-01-08 DIAGNOSIS — Z992 Dependence on renal dialysis: Secondary | ICD-10-CM | POA: Diagnosis not present

## 2018-01-08 DIAGNOSIS — N186 End stage renal disease: Secondary | ICD-10-CM | POA: Diagnosis not present

## 2018-01-08 DIAGNOSIS — I4891 Unspecified atrial fibrillation: Secondary | ICD-10-CM | POA: Diagnosis not present

## 2018-01-08 DIAGNOSIS — N2581 Secondary hyperparathyroidism of renal origin: Secondary | ICD-10-CM | POA: Diagnosis not present

## 2018-01-08 DIAGNOSIS — E8881 Metabolic syndrome: Secondary | ICD-10-CM | POA: Diagnosis not present

## 2018-01-09 DIAGNOSIS — Z992 Dependence on renal dialysis: Secondary | ICD-10-CM | POA: Diagnosis not present

## 2018-01-09 DIAGNOSIS — D509 Iron deficiency anemia, unspecified: Secondary | ICD-10-CM | POA: Diagnosis not present

## 2018-01-09 DIAGNOSIS — N186 End stage renal disease: Secondary | ICD-10-CM | POA: Diagnosis not present

## 2018-01-09 DIAGNOSIS — N2581 Secondary hyperparathyroidism of renal origin: Secondary | ICD-10-CM | POA: Diagnosis not present

## 2018-01-10 DIAGNOSIS — D509 Iron deficiency anemia, unspecified: Secondary | ICD-10-CM | POA: Diagnosis not present

## 2018-01-10 DIAGNOSIS — Z992 Dependence on renal dialysis: Secondary | ICD-10-CM | POA: Diagnosis not present

## 2018-01-10 DIAGNOSIS — N2581 Secondary hyperparathyroidism of renal origin: Secondary | ICD-10-CM | POA: Diagnosis not present

## 2018-01-10 DIAGNOSIS — N186 End stage renal disease: Secondary | ICD-10-CM | POA: Diagnosis not present

## 2018-01-11 DIAGNOSIS — N2581 Secondary hyperparathyroidism of renal origin: Secondary | ICD-10-CM | POA: Diagnosis not present

## 2018-01-11 DIAGNOSIS — N186 End stage renal disease: Secondary | ICD-10-CM | POA: Diagnosis not present

## 2018-01-11 DIAGNOSIS — Z992 Dependence on renal dialysis: Secondary | ICD-10-CM | POA: Diagnosis not present

## 2018-01-11 DIAGNOSIS — D509 Iron deficiency anemia, unspecified: Secondary | ICD-10-CM | POA: Diagnosis not present

## 2018-01-12 DIAGNOSIS — D509 Iron deficiency anemia, unspecified: Secondary | ICD-10-CM | POA: Diagnosis not present

## 2018-01-12 DIAGNOSIS — N186 End stage renal disease: Secondary | ICD-10-CM | POA: Diagnosis not present

## 2018-01-12 DIAGNOSIS — Z992 Dependence on renal dialysis: Secondary | ICD-10-CM | POA: Diagnosis not present

## 2018-01-12 DIAGNOSIS — N2581 Secondary hyperparathyroidism of renal origin: Secondary | ICD-10-CM | POA: Diagnosis not present

## 2018-01-13 DIAGNOSIS — N2581 Secondary hyperparathyroidism of renal origin: Secondary | ICD-10-CM | POA: Diagnosis not present

## 2018-01-13 DIAGNOSIS — D509 Iron deficiency anemia, unspecified: Secondary | ICD-10-CM | POA: Diagnosis not present

## 2018-01-13 DIAGNOSIS — N186 End stage renal disease: Secondary | ICD-10-CM | POA: Diagnosis not present

## 2018-01-13 DIAGNOSIS — Z992 Dependence on renal dialysis: Secondary | ICD-10-CM | POA: Diagnosis not present

## 2018-01-14 DIAGNOSIS — D509 Iron deficiency anemia, unspecified: Secondary | ICD-10-CM | POA: Diagnosis not present

## 2018-01-14 DIAGNOSIS — Z992 Dependence on renal dialysis: Secondary | ICD-10-CM | POA: Diagnosis not present

## 2018-01-14 DIAGNOSIS — Z79899 Other long term (current) drug therapy: Secondary | ICD-10-CM | POA: Diagnosis not present

## 2018-01-14 DIAGNOSIS — N186 End stage renal disease: Secondary | ICD-10-CM | POA: Diagnosis not present

## 2018-01-14 DIAGNOSIS — N2581 Secondary hyperparathyroidism of renal origin: Secondary | ICD-10-CM | POA: Diagnosis not present

## 2018-01-15 DIAGNOSIS — D509 Iron deficiency anemia, unspecified: Secondary | ICD-10-CM | POA: Diagnosis not present

## 2018-01-15 DIAGNOSIS — N2581 Secondary hyperparathyroidism of renal origin: Secondary | ICD-10-CM | POA: Diagnosis not present

## 2018-01-15 DIAGNOSIS — Z992 Dependence on renal dialysis: Secondary | ICD-10-CM | POA: Diagnosis not present

## 2018-01-15 DIAGNOSIS — N186 End stage renal disease: Secondary | ICD-10-CM | POA: Diagnosis not present

## 2018-01-16 DIAGNOSIS — N186 End stage renal disease: Secondary | ICD-10-CM | POA: Diagnosis not present

## 2018-01-16 DIAGNOSIS — Z992 Dependence on renal dialysis: Secondary | ICD-10-CM | POA: Diagnosis not present

## 2018-01-16 DIAGNOSIS — D509 Iron deficiency anemia, unspecified: Secondary | ICD-10-CM | POA: Diagnosis not present

## 2018-01-16 DIAGNOSIS — N2581 Secondary hyperparathyroidism of renal origin: Secondary | ICD-10-CM | POA: Diagnosis not present

## 2018-01-17 DIAGNOSIS — Z992 Dependence on renal dialysis: Secondary | ICD-10-CM | POA: Diagnosis not present

## 2018-01-17 DIAGNOSIS — D509 Iron deficiency anemia, unspecified: Secondary | ICD-10-CM | POA: Diagnosis not present

## 2018-01-17 DIAGNOSIS — N2581 Secondary hyperparathyroidism of renal origin: Secondary | ICD-10-CM | POA: Diagnosis not present

## 2018-01-17 DIAGNOSIS — N186 End stage renal disease: Secondary | ICD-10-CM | POA: Diagnosis not present

## 2018-01-18 DIAGNOSIS — E119 Type 2 diabetes mellitus without complications: Secondary | ICD-10-CM | POA: Diagnosis not present

## 2018-01-18 DIAGNOSIS — N186 End stage renal disease: Secondary | ICD-10-CM | POA: Diagnosis not present

## 2018-01-18 DIAGNOSIS — N2581 Secondary hyperparathyroidism of renal origin: Secondary | ICD-10-CM | POA: Diagnosis not present

## 2018-01-18 DIAGNOSIS — Z992 Dependence on renal dialysis: Secondary | ICD-10-CM | POA: Diagnosis not present

## 2018-01-18 DIAGNOSIS — D509 Iron deficiency anemia, unspecified: Secondary | ICD-10-CM | POA: Diagnosis not present

## 2018-01-19 DIAGNOSIS — D509 Iron deficiency anemia, unspecified: Secondary | ICD-10-CM | POA: Diagnosis not present

## 2018-01-19 DIAGNOSIS — N2581 Secondary hyperparathyroidism of renal origin: Secondary | ICD-10-CM | POA: Diagnosis not present

## 2018-01-19 DIAGNOSIS — Z992 Dependence on renal dialysis: Secondary | ICD-10-CM | POA: Diagnosis not present

## 2018-01-19 DIAGNOSIS — N186 End stage renal disease: Secondary | ICD-10-CM | POA: Diagnosis not present

## 2018-01-20 DIAGNOSIS — N186 End stage renal disease: Secondary | ICD-10-CM | POA: Diagnosis not present

## 2018-01-20 DIAGNOSIS — D509 Iron deficiency anemia, unspecified: Secondary | ICD-10-CM | POA: Diagnosis not present

## 2018-01-20 DIAGNOSIS — N2581 Secondary hyperparathyroidism of renal origin: Secondary | ICD-10-CM | POA: Diagnosis not present

## 2018-01-20 DIAGNOSIS — Z992 Dependence on renal dialysis: Secondary | ICD-10-CM | POA: Diagnosis not present

## 2018-01-21 DIAGNOSIS — Z992 Dependence on renal dialysis: Secondary | ICD-10-CM | POA: Diagnosis not present

## 2018-01-21 DIAGNOSIS — N2581 Secondary hyperparathyroidism of renal origin: Secondary | ICD-10-CM | POA: Diagnosis not present

## 2018-01-21 DIAGNOSIS — D509 Iron deficiency anemia, unspecified: Secondary | ICD-10-CM | POA: Diagnosis not present

## 2018-01-21 DIAGNOSIS — N186 End stage renal disease: Secondary | ICD-10-CM | POA: Diagnosis not present

## 2018-01-22 DIAGNOSIS — Z992 Dependence on renal dialysis: Secondary | ICD-10-CM | POA: Diagnosis not present

## 2018-01-22 DIAGNOSIS — N2581 Secondary hyperparathyroidism of renal origin: Secondary | ICD-10-CM | POA: Diagnosis not present

## 2018-01-22 DIAGNOSIS — D509 Iron deficiency anemia, unspecified: Secondary | ICD-10-CM | POA: Diagnosis not present

## 2018-01-22 DIAGNOSIS — N186 End stage renal disease: Secondary | ICD-10-CM | POA: Diagnosis not present

## 2018-01-23 DIAGNOSIS — N186 End stage renal disease: Secondary | ICD-10-CM | POA: Diagnosis not present

## 2018-01-23 DIAGNOSIS — D509 Iron deficiency anemia, unspecified: Secondary | ICD-10-CM | POA: Diagnosis not present

## 2018-01-23 DIAGNOSIS — N2581 Secondary hyperparathyroidism of renal origin: Secondary | ICD-10-CM | POA: Diagnosis not present

## 2018-01-23 DIAGNOSIS — Z992 Dependence on renal dialysis: Secondary | ICD-10-CM | POA: Diagnosis not present

## 2018-01-24 DIAGNOSIS — Z992 Dependence on renal dialysis: Secondary | ICD-10-CM | POA: Diagnosis not present

## 2018-01-24 DIAGNOSIS — N2581 Secondary hyperparathyroidism of renal origin: Secondary | ICD-10-CM | POA: Diagnosis not present

## 2018-01-24 DIAGNOSIS — N186 End stage renal disease: Secondary | ICD-10-CM | POA: Diagnosis not present

## 2018-01-24 DIAGNOSIS — D509 Iron deficiency anemia, unspecified: Secondary | ICD-10-CM | POA: Diagnosis not present

## 2018-01-25 DIAGNOSIS — D509 Iron deficiency anemia, unspecified: Secondary | ICD-10-CM | POA: Diagnosis not present

## 2018-01-25 DIAGNOSIS — N2581 Secondary hyperparathyroidism of renal origin: Secondary | ICD-10-CM | POA: Diagnosis not present

## 2018-01-25 DIAGNOSIS — Z992 Dependence on renal dialysis: Secondary | ICD-10-CM | POA: Diagnosis not present

## 2018-01-25 DIAGNOSIS — N186 End stage renal disease: Secondary | ICD-10-CM | POA: Diagnosis not present

## 2018-01-26 DIAGNOSIS — Z992 Dependence on renal dialysis: Secondary | ICD-10-CM | POA: Diagnosis not present

## 2018-01-26 DIAGNOSIS — D509 Iron deficiency anemia, unspecified: Secondary | ICD-10-CM | POA: Diagnosis not present

## 2018-01-26 DIAGNOSIS — N2581 Secondary hyperparathyroidism of renal origin: Secondary | ICD-10-CM | POA: Diagnosis not present

## 2018-01-26 DIAGNOSIS — N186 End stage renal disease: Secondary | ICD-10-CM | POA: Diagnosis not present

## 2018-01-27 DIAGNOSIS — Z992 Dependence on renal dialysis: Secondary | ICD-10-CM | POA: Diagnosis not present

## 2018-01-27 DIAGNOSIS — D509 Iron deficiency anemia, unspecified: Secondary | ICD-10-CM | POA: Diagnosis not present

## 2018-01-27 DIAGNOSIS — N2581 Secondary hyperparathyroidism of renal origin: Secondary | ICD-10-CM | POA: Diagnosis not present

## 2018-01-27 DIAGNOSIS — N186 End stage renal disease: Secondary | ICD-10-CM | POA: Diagnosis not present

## 2018-01-28 DIAGNOSIS — N2581 Secondary hyperparathyroidism of renal origin: Secondary | ICD-10-CM | POA: Diagnosis not present

## 2018-01-28 DIAGNOSIS — N186 End stage renal disease: Secondary | ICD-10-CM | POA: Diagnosis not present

## 2018-01-28 DIAGNOSIS — D509 Iron deficiency anemia, unspecified: Secondary | ICD-10-CM | POA: Diagnosis not present

## 2018-01-28 DIAGNOSIS — Z992 Dependence on renal dialysis: Secondary | ICD-10-CM | POA: Diagnosis not present

## 2018-01-29 DIAGNOSIS — D509 Iron deficiency anemia, unspecified: Secondary | ICD-10-CM | POA: Diagnosis not present

## 2018-01-29 DIAGNOSIS — N186 End stage renal disease: Secondary | ICD-10-CM | POA: Diagnosis not present

## 2018-01-29 DIAGNOSIS — N2581 Secondary hyperparathyroidism of renal origin: Secondary | ICD-10-CM | POA: Diagnosis not present

## 2018-01-29 DIAGNOSIS — Z992 Dependence on renal dialysis: Secondary | ICD-10-CM | POA: Diagnosis not present

## 2018-01-30 DIAGNOSIS — N2581 Secondary hyperparathyroidism of renal origin: Secondary | ICD-10-CM | POA: Diagnosis not present

## 2018-01-30 DIAGNOSIS — Z992 Dependence on renal dialysis: Secondary | ICD-10-CM | POA: Diagnosis not present

## 2018-01-30 DIAGNOSIS — N186 End stage renal disease: Secondary | ICD-10-CM | POA: Diagnosis not present

## 2018-01-30 DIAGNOSIS — D509 Iron deficiency anemia, unspecified: Secondary | ICD-10-CM | POA: Diagnosis not present

## 2018-01-31 DIAGNOSIS — Z992 Dependence on renal dialysis: Secondary | ICD-10-CM | POA: Diagnosis not present

## 2018-01-31 DIAGNOSIS — N186 End stage renal disease: Secondary | ICD-10-CM | POA: Diagnosis not present

## 2018-01-31 DIAGNOSIS — N2581 Secondary hyperparathyroidism of renal origin: Secondary | ICD-10-CM | POA: Diagnosis not present

## 2018-01-31 DIAGNOSIS — D509 Iron deficiency anemia, unspecified: Secondary | ICD-10-CM | POA: Diagnosis not present

## 2018-02-01 DIAGNOSIS — N186 End stage renal disease: Secondary | ICD-10-CM | POA: Diagnosis not present

## 2018-02-01 DIAGNOSIS — N2581 Secondary hyperparathyroidism of renal origin: Secondary | ICD-10-CM | POA: Diagnosis not present

## 2018-02-01 DIAGNOSIS — D509 Iron deficiency anemia, unspecified: Secondary | ICD-10-CM | POA: Diagnosis not present

## 2018-02-01 DIAGNOSIS — Z992 Dependence on renal dialysis: Secondary | ICD-10-CM | POA: Diagnosis not present

## 2018-02-02 DIAGNOSIS — N2581 Secondary hyperparathyroidism of renal origin: Secondary | ICD-10-CM | POA: Diagnosis not present

## 2018-02-02 DIAGNOSIS — D509 Iron deficiency anemia, unspecified: Secondary | ICD-10-CM | POA: Diagnosis not present

## 2018-02-02 DIAGNOSIS — Z992 Dependence on renal dialysis: Secondary | ICD-10-CM | POA: Diagnosis not present

## 2018-02-02 DIAGNOSIS — N186 End stage renal disease: Secondary | ICD-10-CM | POA: Diagnosis not present

## 2018-02-03 DIAGNOSIS — D509 Iron deficiency anemia, unspecified: Secondary | ICD-10-CM | POA: Diagnosis not present

## 2018-02-03 DIAGNOSIS — Z992 Dependence on renal dialysis: Secondary | ICD-10-CM | POA: Diagnosis not present

## 2018-02-03 DIAGNOSIS — N2581 Secondary hyperparathyroidism of renal origin: Secondary | ICD-10-CM | POA: Diagnosis not present

## 2018-02-03 DIAGNOSIS — N186 End stage renal disease: Secondary | ICD-10-CM | POA: Diagnosis not present

## 2018-02-04 DIAGNOSIS — D509 Iron deficiency anemia, unspecified: Secondary | ICD-10-CM | POA: Diagnosis not present

## 2018-02-04 DIAGNOSIS — N2581 Secondary hyperparathyroidism of renal origin: Secondary | ICD-10-CM | POA: Diagnosis not present

## 2018-02-04 DIAGNOSIS — N186 End stage renal disease: Secondary | ICD-10-CM | POA: Diagnosis not present

## 2018-02-04 DIAGNOSIS — Z992 Dependence on renal dialysis: Secondary | ICD-10-CM | POA: Diagnosis not present

## 2018-02-05 DIAGNOSIS — Z992 Dependence on renal dialysis: Secondary | ICD-10-CM | POA: Diagnosis not present

## 2018-02-05 DIAGNOSIS — N2581 Secondary hyperparathyroidism of renal origin: Secondary | ICD-10-CM | POA: Diagnosis not present

## 2018-02-05 DIAGNOSIS — D509 Iron deficiency anemia, unspecified: Secondary | ICD-10-CM | POA: Diagnosis not present

## 2018-02-05 DIAGNOSIS — N186 End stage renal disease: Secondary | ICD-10-CM | POA: Diagnosis not present

## 2018-02-06 DIAGNOSIS — N2581 Secondary hyperparathyroidism of renal origin: Secondary | ICD-10-CM | POA: Diagnosis not present

## 2018-02-06 DIAGNOSIS — Z992 Dependence on renal dialysis: Secondary | ICD-10-CM | POA: Diagnosis not present

## 2018-02-06 DIAGNOSIS — N186 End stage renal disease: Secondary | ICD-10-CM | POA: Diagnosis not present

## 2018-02-06 DIAGNOSIS — D509 Iron deficiency anemia, unspecified: Secondary | ICD-10-CM | POA: Diagnosis not present

## 2018-02-07 DIAGNOSIS — Z992 Dependence on renal dialysis: Secondary | ICD-10-CM | POA: Diagnosis not present

## 2018-02-07 DIAGNOSIS — N2581 Secondary hyperparathyroidism of renal origin: Secondary | ICD-10-CM | POA: Diagnosis not present

## 2018-02-07 DIAGNOSIS — N186 End stage renal disease: Secondary | ICD-10-CM | POA: Diagnosis not present

## 2018-02-07 DIAGNOSIS — D509 Iron deficiency anemia, unspecified: Secondary | ICD-10-CM | POA: Diagnosis not present

## 2018-02-08 DIAGNOSIS — N2581 Secondary hyperparathyroidism of renal origin: Secondary | ICD-10-CM | POA: Diagnosis not present

## 2018-02-08 DIAGNOSIS — Z992 Dependence on renal dialysis: Secondary | ICD-10-CM | POA: Diagnosis not present

## 2018-02-08 DIAGNOSIS — D509 Iron deficiency anemia, unspecified: Secondary | ICD-10-CM | POA: Diagnosis not present

## 2018-02-08 DIAGNOSIS — N186 End stage renal disease: Secondary | ICD-10-CM | POA: Diagnosis not present

## 2018-02-09 DIAGNOSIS — D509 Iron deficiency anemia, unspecified: Secondary | ICD-10-CM | POA: Diagnosis not present

## 2018-02-09 DIAGNOSIS — N186 End stage renal disease: Secondary | ICD-10-CM | POA: Diagnosis not present

## 2018-02-09 DIAGNOSIS — N2581 Secondary hyperparathyroidism of renal origin: Secondary | ICD-10-CM | POA: Diagnosis not present

## 2018-02-09 DIAGNOSIS — Z992 Dependence on renal dialysis: Secondary | ICD-10-CM | POA: Diagnosis not present

## 2018-02-10 DIAGNOSIS — N186 End stage renal disease: Secondary | ICD-10-CM | POA: Diagnosis not present

## 2018-02-10 DIAGNOSIS — I4891 Unspecified atrial fibrillation: Secondary | ICD-10-CM | POA: Diagnosis not present

## 2018-02-10 DIAGNOSIS — B20 Human immunodeficiency virus [HIV] disease: Secondary | ICD-10-CM | POA: Diagnosis not present

## 2018-02-10 DIAGNOSIS — B182 Chronic viral hepatitis C: Secondary | ICD-10-CM | POA: Diagnosis not present

## 2018-02-10 DIAGNOSIS — D509 Iron deficiency anemia, unspecified: Secondary | ICD-10-CM | POA: Diagnosis not present

## 2018-02-10 DIAGNOSIS — N2581 Secondary hyperparathyroidism of renal origin: Secondary | ICD-10-CM | POA: Diagnosis not present

## 2018-02-10 DIAGNOSIS — J019 Acute sinusitis, unspecified: Secondary | ICD-10-CM | POA: Diagnosis not present

## 2018-02-10 DIAGNOSIS — Z992 Dependence on renal dialysis: Secondary | ICD-10-CM | POA: Diagnosis not present

## 2018-02-11 DIAGNOSIS — N186 End stage renal disease: Secondary | ICD-10-CM | POA: Diagnosis not present

## 2018-02-11 DIAGNOSIS — D509 Iron deficiency anemia, unspecified: Secondary | ICD-10-CM | POA: Diagnosis not present

## 2018-02-11 DIAGNOSIS — N2581 Secondary hyperparathyroidism of renal origin: Secondary | ICD-10-CM | POA: Diagnosis not present

## 2018-02-11 DIAGNOSIS — Z992 Dependence on renal dialysis: Secondary | ICD-10-CM | POA: Diagnosis not present

## 2018-02-12 DIAGNOSIS — Z992 Dependence on renal dialysis: Secondary | ICD-10-CM | POA: Diagnosis not present

## 2018-02-12 DIAGNOSIS — N186 End stage renal disease: Secondary | ICD-10-CM | POA: Diagnosis not present

## 2018-02-12 DIAGNOSIS — D509 Iron deficiency anemia, unspecified: Secondary | ICD-10-CM | POA: Diagnosis not present

## 2018-02-12 DIAGNOSIS — N2581 Secondary hyperparathyroidism of renal origin: Secondary | ICD-10-CM | POA: Diagnosis not present

## 2018-02-13 DIAGNOSIS — N186 End stage renal disease: Secondary | ICD-10-CM | POA: Diagnosis not present

## 2018-02-13 DIAGNOSIS — D509 Iron deficiency anemia, unspecified: Secondary | ICD-10-CM | POA: Diagnosis not present

## 2018-02-13 DIAGNOSIS — N2581 Secondary hyperparathyroidism of renal origin: Secondary | ICD-10-CM | POA: Diagnosis not present

## 2018-02-13 DIAGNOSIS — Z992 Dependence on renal dialysis: Secondary | ICD-10-CM | POA: Diagnosis not present

## 2018-02-14 DIAGNOSIS — D509 Iron deficiency anemia, unspecified: Secondary | ICD-10-CM | POA: Diagnosis not present

## 2018-02-14 DIAGNOSIS — N186 End stage renal disease: Secondary | ICD-10-CM | POA: Diagnosis not present

## 2018-02-14 DIAGNOSIS — N2581 Secondary hyperparathyroidism of renal origin: Secondary | ICD-10-CM | POA: Diagnosis not present

## 2018-02-14 DIAGNOSIS — Z992 Dependence on renal dialysis: Secondary | ICD-10-CM | POA: Diagnosis not present

## 2018-02-15 DIAGNOSIS — N186 End stage renal disease: Secondary | ICD-10-CM | POA: Diagnosis not present

## 2018-02-15 DIAGNOSIS — N2581 Secondary hyperparathyroidism of renal origin: Secondary | ICD-10-CM | POA: Diagnosis not present

## 2018-02-15 DIAGNOSIS — D509 Iron deficiency anemia, unspecified: Secondary | ICD-10-CM | POA: Diagnosis not present

## 2018-02-15 DIAGNOSIS — Z992 Dependence on renal dialysis: Secondary | ICD-10-CM | POA: Diagnosis not present

## 2018-02-16 DIAGNOSIS — N186 End stage renal disease: Secondary | ICD-10-CM | POA: Diagnosis not present

## 2018-02-16 DIAGNOSIS — D509 Iron deficiency anemia, unspecified: Secondary | ICD-10-CM | POA: Diagnosis not present

## 2018-02-16 DIAGNOSIS — N2581 Secondary hyperparathyroidism of renal origin: Secondary | ICD-10-CM | POA: Diagnosis not present

## 2018-02-16 DIAGNOSIS — Z992 Dependence on renal dialysis: Secondary | ICD-10-CM | POA: Diagnosis not present

## 2018-02-17 DIAGNOSIS — Z992 Dependence on renal dialysis: Secondary | ICD-10-CM | POA: Diagnosis not present

## 2018-02-17 DIAGNOSIS — N2581 Secondary hyperparathyroidism of renal origin: Secondary | ICD-10-CM | POA: Diagnosis not present

## 2018-02-17 DIAGNOSIS — D509 Iron deficiency anemia, unspecified: Secondary | ICD-10-CM | POA: Diagnosis not present

## 2018-02-17 DIAGNOSIS — N186 End stage renal disease: Secondary | ICD-10-CM | POA: Diagnosis not present

## 2018-02-18 DIAGNOSIS — Z992 Dependence on renal dialysis: Secondary | ICD-10-CM | POA: Diagnosis not present

## 2018-02-18 DIAGNOSIS — D509 Iron deficiency anemia, unspecified: Secondary | ICD-10-CM | POA: Diagnosis not present

## 2018-02-18 DIAGNOSIS — N186 End stage renal disease: Secondary | ICD-10-CM | POA: Diagnosis not present

## 2018-02-18 DIAGNOSIS — N2581 Secondary hyperparathyroidism of renal origin: Secondary | ICD-10-CM | POA: Diagnosis not present

## 2018-02-19 DIAGNOSIS — D509 Iron deficiency anemia, unspecified: Secondary | ICD-10-CM | POA: Diagnosis not present

## 2018-02-19 DIAGNOSIS — Z992 Dependence on renal dialysis: Secondary | ICD-10-CM | POA: Diagnosis not present

## 2018-02-19 DIAGNOSIS — N186 End stage renal disease: Secondary | ICD-10-CM | POA: Diagnosis not present

## 2018-02-19 DIAGNOSIS — N2581 Secondary hyperparathyroidism of renal origin: Secondary | ICD-10-CM | POA: Diagnosis not present

## 2018-02-20 DIAGNOSIS — N2581 Secondary hyperparathyroidism of renal origin: Secondary | ICD-10-CM | POA: Diagnosis not present

## 2018-02-20 DIAGNOSIS — Z992 Dependence on renal dialysis: Secondary | ICD-10-CM | POA: Diagnosis not present

## 2018-02-20 DIAGNOSIS — N186 End stage renal disease: Secondary | ICD-10-CM | POA: Diagnosis not present

## 2018-02-20 DIAGNOSIS — D509 Iron deficiency anemia, unspecified: Secondary | ICD-10-CM | POA: Diagnosis not present

## 2018-02-21 DIAGNOSIS — N186 End stage renal disease: Secondary | ICD-10-CM | POA: Diagnosis not present

## 2018-02-21 DIAGNOSIS — Z992 Dependence on renal dialysis: Secondary | ICD-10-CM | POA: Diagnosis not present

## 2018-02-21 DIAGNOSIS — D509 Iron deficiency anemia, unspecified: Secondary | ICD-10-CM | POA: Diagnosis not present

## 2018-02-21 DIAGNOSIS — N2581 Secondary hyperparathyroidism of renal origin: Secondary | ICD-10-CM | POA: Diagnosis not present

## 2018-02-22 DIAGNOSIS — Z992 Dependence on renal dialysis: Secondary | ICD-10-CM | POA: Diagnosis not present

## 2018-02-22 DIAGNOSIS — N186 End stage renal disease: Secondary | ICD-10-CM | POA: Diagnosis not present

## 2018-02-22 DIAGNOSIS — N2581 Secondary hyperparathyroidism of renal origin: Secondary | ICD-10-CM | POA: Diagnosis not present

## 2018-02-22 DIAGNOSIS — D509 Iron deficiency anemia, unspecified: Secondary | ICD-10-CM | POA: Diagnosis not present

## 2018-02-23 DIAGNOSIS — N186 End stage renal disease: Secondary | ICD-10-CM | POA: Diagnosis not present

## 2018-02-23 DIAGNOSIS — D509 Iron deficiency anemia, unspecified: Secondary | ICD-10-CM | POA: Diagnosis not present

## 2018-02-23 DIAGNOSIS — Z992 Dependence on renal dialysis: Secondary | ICD-10-CM | POA: Diagnosis not present

## 2018-02-23 DIAGNOSIS — N2581 Secondary hyperparathyroidism of renal origin: Secondary | ICD-10-CM | POA: Diagnosis not present

## 2018-02-24 DIAGNOSIS — D509 Iron deficiency anemia, unspecified: Secondary | ICD-10-CM | POA: Diagnosis not present

## 2018-02-24 DIAGNOSIS — N2581 Secondary hyperparathyroidism of renal origin: Secondary | ICD-10-CM | POA: Diagnosis not present

## 2018-02-24 DIAGNOSIS — Z992 Dependence on renal dialysis: Secondary | ICD-10-CM | POA: Diagnosis not present

## 2018-02-24 DIAGNOSIS — N186 End stage renal disease: Secondary | ICD-10-CM | POA: Diagnosis not present

## 2018-02-25 DIAGNOSIS — D509 Iron deficiency anemia, unspecified: Secondary | ICD-10-CM | POA: Diagnosis not present

## 2018-02-25 DIAGNOSIS — N186 End stage renal disease: Secondary | ICD-10-CM | POA: Diagnosis not present

## 2018-02-25 DIAGNOSIS — Z992 Dependence on renal dialysis: Secondary | ICD-10-CM | POA: Diagnosis not present

## 2018-02-25 DIAGNOSIS — N2581 Secondary hyperparathyroidism of renal origin: Secondary | ICD-10-CM | POA: Diagnosis not present

## 2018-02-26 DIAGNOSIS — D509 Iron deficiency anemia, unspecified: Secondary | ICD-10-CM | POA: Diagnosis not present

## 2018-02-26 DIAGNOSIS — Z992 Dependence on renal dialysis: Secondary | ICD-10-CM | POA: Diagnosis not present

## 2018-02-26 DIAGNOSIS — N186 End stage renal disease: Secondary | ICD-10-CM | POA: Diagnosis not present

## 2018-02-26 DIAGNOSIS — N2581 Secondary hyperparathyroidism of renal origin: Secondary | ICD-10-CM | POA: Diagnosis not present

## 2018-02-27 DIAGNOSIS — D509 Iron deficiency anemia, unspecified: Secondary | ICD-10-CM | POA: Diagnosis not present

## 2018-02-27 DIAGNOSIS — Z992 Dependence on renal dialysis: Secondary | ICD-10-CM | POA: Diagnosis not present

## 2018-02-27 DIAGNOSIS — N2581 Secondary hyperparathyroidism of renal origin: Secondary | ICD-10-CM | POA: Diagnosis not present

## 2018-02-27 DIAGNOSIS — N186 End stage renal disease: Secondary | ICD-10-CM | POA: Diagnosis not present

## 2018-02-28 DIAGNOSIS — N2581 Secondary hyperparathyroidism of renal origin: Secondary | ICD-10-CM | POA: Diagnosis not present

## 2018-02-28 DIAGNOSIS — N186 End stage renal disease: Secondary | ICD-10-CM | POA: Diagnosis not present

## 2018-02-28 DIAGNOSIS — D509 Iron deficiency anemia, unspecified: Secondary | ICD-10-CM | POA: Diagnosis not present

## 2018-02-28 DIAGNOSIS — Z992 Dependence on renal dialysis: Secondary | ICD-10-CM | POA: Diagnosis not present

## 2018-03-01 DIAGNOSIS — N2581 Secondary hyperparathyroidism of renal origin: Secondary | ICD-10-CM | POA: Diagnosis not present

## 2018-03-01 DIAGNOSIS — N186 End stage renal disease: Secondary | ICD-10-CM | POA: Diagnosis not present

## 2018-03-01 DIAGNOSIS — Z992 Dependence on renal dialysis: Secondary | ICD-10-CM | POA: Diagnosis not present

## 2018-03-01 DIAGNOSIS — D509 Iron deficiency anemia, unspecified: Secondary | ICD-10-CM | POA: Diagnosis not present

## 2018-03-02 DIAGNOSIS — H40003 Preglaucoma, unspecified, bilateral: Secondary | ICD-10-CM | POA: Diagnosis not present

## 2018-03-02 DIAGNOSIS — Z992 Dependence on renal dialysis: Secondary | ICD-10-CM | POA: Diagnosis not present

## 2018-03-02 DIAGNOSIS — D509 Iron deficiency anemia, unspecified: Secondary | ICD-10-CM | POA: Diagnosis not present

## 2018-03-02 DIAGNOSIS — N2581 Secondary hyperparathyroidism of renal origin: Secondary | ICD-10-CM | POA: Diagnosis not present

## 2018-03-02 DIAGNOSIS — N186 End stage renal disease: Secondary | ICD-10-CM | POA: Diagnosis not present

## 2018-03-03 DIAGNOSIS — N2581 Secondary hyperparathyroidism of renal origin: Secondary | ICD-10-CM | POA: Diagnosis not present

## 2018-03-03 DIAGNOSIS — D509 Iron deficiency anemia, unspecified: Secondary | ICD-10-CM | POA: Diagnosis not present

## 2018-03-03 DIAGNOSIS — Z992 Dependence on renal dialysis: Secondary | ICD-10-CM | POA: Diagnosis not present

## 2018-03-03 DIAGNOSIS — N186 End stage renal disease: Secondary | ICD-10-CM | POA: Diagnosis not present

## 2018-03-04 DIAGNOSIS — N2581 Secondary hyperparathyroidism of renal origin: Secondary | ICD-10-CM | POA: Diagnosis not present

## 2018-03-04 DIAGNOSIS — N186 End stage renal disease: Secondary | ICD-10-CM | POA: Diagnosis not present

## 2018-03-04 DIAGNOSIS — Z992 Dependence on renal dialysis: Secondary | ICD-10-CM | POA: Diagnosis not present

## 2018-03-04 DIAGNOSIS — D509 Iron deficiency anemia, unspecified: Secondary | ICD-10-CM | POA: Diagnosis not present

## 2018-03-05 DIAGNOSIS — N186 End stage renal disease: Secondary | ICD-10-CM | POA: Diagnosis not present

## 2018-03-05 DIAGNOSIS — Z992 Dependence on renal dialysis: Secondary | ICD-10-CM | POA: Diagnosis not present

## 2018-03-05 DIAGNOSIS — D509 Iron deficiency anemia, unspecified: Secondary | ICD-10-CM | POA: Diagnosis not present

## 2018-03-05 DIAGNOSIS — N2581 Secondary hyperparathyroidism of renal origin: Secondary | ICD-10-CM | POA: Diagnosis not present

## 2018-03-06 DIAGNOSIS — D509 Iron deficiency anemia, unspecified: Secondary | ICD-10-CM | POA: Diagnosis not present

## 2018-03-06 DIAGNOSIS — N186 End stage renal disease: Secondary | ICD-10-CM | POA: Diagnosis not present

## 2018-03-06 DIAGNOSIS — Z992 Dependence on renal dialysis: Secondary | ICD-10-CM | POA: Diagnosis not present

## 2018-03-06 DIAGNOSIS — N2581 Secondary hyperparathyroidism of renal origin: Secondary | ICD-10-CM | POA: Diagnosis not present

## 2018-03-07 DIAGNOSIS — D509 Iron deficiency anemia, unspecified: Secondary | ICD-10-CM | POA: Diagnosis not present

## 2018-03-07 DIAGNOSIS — N186 End stage renal disease: Secondary | ICD-10-CM | POA: Diagnosis not present

## 2018-03-07 DIAGNOSIS — N2581 Secondary hyperparathyroidism of renal origin: Secondary | ICD-10-CM | POA: Diagnosis not present

## 2018-03-07 DIAGNOSIS — Z992 Dependence on renal dialysis: Secondary | ICD-10-CM | POA: Diagnosis not present

## 2018-03-08 DIAGNOSIS — N2581 Secondary hyperparathyroidism of renal origin: Secondary | ICD-10-CM | POA: Diagnosis not present

## 2018-03-08 DIAGNOSIS — D509 Iron deficiency anemia, unspecified: Secondary | ICD-10-CM | POA: Diagnosis not present

## 2018-03-08 DIAGNOSIS — N186 End stage renal disease: Secondary | ICD-10-CM | POA: Diagnosis not present

## 2018-03-08 DIAGNOSIS — Z992 Dependence on renal dialysis: Secondary | ICD-10-CM | POA: Diagnosis not present

## 2018-03-09 DIAGNOSIS — Z992 Dependence on renal dialysis: Secondary | ICD-10-CM | POA: Diagnosis not present

## 2018-03-09 DIAGNOSIS — N186 End stage renal disease: Secondary | ICD-10-CM | POA: Diagnosis not present

## 2018-03-09 DIAGNOSIS — D509 Iron deficiency anemia, unspecified: Secondary | ICD-10-CM | POA: Diagnosis not present

## 2018-03-09 DIAGNOSIS — N2581 Secondary hyperparathyroidism of renal origin: Secondary | ICD-10-CM | POA: Diagnosis not present

## 2018-03-10 DIAGNOSIS — N2581 Secondary hyperparathyroidism of renal origin: Secondary | ICD-10-CM | POA: Diagnosis not present

## 2018-03-10 DIAGNOSIS — Z992 Dependence on renal dialysis: Secondary | ICD-10-CM | POA: Diagnosis not present

## 2018-03-10 DIAGNOSIS — D509 Iron deficiency anemia, unspecified: Secondary | ICD-10-CM | POA: Diagnosis not present

## 2018-03-10 DIAGNOSIS — N186 End stage renal disease: Secondary | ICD-10-CM | POA: Diagnosis not present

## 2018-03-11 DIAGNOSIS — N186 End stage renal disease: Secondary | ICD-10-CM | POA: Diagnosis not present

## 2018-03-11 DIAGNOSIS — Z992 Dependence on renal dialysis: Secondary | ICD-10-CM | POA: Diagnosis not present

## 2018-03-11 DIAGNOSIS — D509 Iron deficiency anemia, unspecified: Secondary | ICD-10-CM | POA: Diagnosis not present

## 2018-03-11 DIAGNOSIS — N2581 Secondary hyperparathyroidism of renal origin: Secondary | ICD-10-CM | POA: Diagnosis not present

## 2018-03-12 DIAGNOSIS — I472 Ventricular tachycardia: Secondary | ICD-10-CM | POA: Diagnosis not present

## 2018-03-12 DIAGNOSIS — D509 Iron deficiency anemia, unspecified: Secondary | ICD-10-CM | POA: Diagnosis not present

## 2018-03-12 DIAGNOSIS — N186 End stage renal disease: Secondary | ICD-10-CM | POA: Diagnosis not present

## 2018-03-12 DIAGNOSIS — N2581 Secondary hyperparathyroidism of renal origin: Secondary | ICD-10-CM | POA: Diagnosis not present

## 2018-03-12 DIAGNOSIS — B182 Chronic viral hepatitis C: Secondary | ICD-10-CM | POA: Diagnosis not present

## 2018-03-12 DIAGNOSIS — J019 Acute sinusitis, unspecified: Secondary | ICD-10-CM | POA: Diagnosis not present

## 2018-03-12 DIAGNOSIS — I4891 Unspecified atrial fibrillation: Secondary | ICD-10-CM | POA: Diagnosis not present

## 2018-03-12 DIAGNOSIS — Z992 Dependence on renal dialysis: Secondary | ICD-10-CM | POA: Diagnosis not present

## 2018-03-12 DIAGNOSIS — Z4502 Encounter for adjustment and management of automatic implantable cardiac defibrillator: Secondary | ICD-10-CM | POA: Diagnosis not present

## 2018-03-13 DIAGNOSIS — N2581 Secondary hyperparathyroidism of renal origin: Secondary | ICD-10-CM | POA: Diagnosis not present

## 2018-03-13 DIAGNOSIS — Z992 Dependence on renal dialysis: Secondary | ICD-10-CM | POA: Diagnosis not present

## 2018-03-13 DIAGNOSIS — D509 Iron deficiency anemia, unspecified: Secondary | ICD-10-CM | POA: Diagnosis not present

## 2018-03-13 DIAGNOSIS — N186 End stage renal disease: Secondary | ICD-10-CM | POA: Diagnosis not present

## 2018-03-15 DIAGNOSIS — N2581 Secondary hyperparathyroidism of renal origin: Secondary | ICD-10-CM | POA: Diagnosis not present

## 2018-03-15 DIAGNOSIS — Z992 Dependence on renal dialysis: Secondary | ICD-10-CM | POA: Diagnosis not present

## 2018-03-15 DIAGNOSIS — D509 Iron deficiency anemia, unspecified: Secondary | ICD-10-CM | POA: Diagnosis not present

## 2018-03-15 DIAGNOSIS — N186 End stage renal disease: Secondary | ICD-10-CM | POA: Diagnosis not present

## 2018-03-16 DIAGNOSIS — N2581 Secondary hyperparathyroidism of renal origin: Secondary | ICD-10-CM | POA: Diagnosis not present

## 2018-03-16 DIAGNOSIS — D509 Iron deficiency anemia, unspecified: Secondary | ICD-10-CM | POA: Diagnosis not present

## 2018-03-16 DIAGNOSIS — Z992 Dependence on renal dialysis: Secondary | ICD-10-CM | POA: Diagnosis not present

## 2018-03-16 DIAGNOSIS — N186 End stage renal disease: Secondary | ICD-10-CM | POA: Diagnosis not present

## 2018-03-17 DIAGNOSIS — N186 End stage renal disease: Secondary | ICD-10-CM | POA: Diagnosis not present

## 2018-03-17 DIAGNOSIS — D509 Iron deficiency anemia, unspecified: Secondary | ICD-10-CM | POA: Diagnosis not present

## 2018-03-17 DIAGNOSIS — Z992 Dependence on renal dialysis: Secondary | ICD-10-CM | POA: Diagnosis not present

## 2018-03-17 DIAGNOSIS — N2581 Secondary hyperparathyroidism of renal origin: Secondary | ICD-10-CM | POA: Diagnosis not present

## 2018-03-18 DIAGNOSIS — N2581 Secondary hyperparathyroidism of renal origin: Secondary | ICD-10-CM | POA: Diagnosis not present

## 2018-03-18 DIAGNOSIS — Z992 Dependence on renal dialysis: Secondary | ICD-10-CM | POA: Diagnosis not present

## 2018-03-18 DIAGNOSIS — D509 Iron deficiency anemia, unspecified: Secondary | ICD-10-CM | POA: Diagnosis not present

## 2018-03-18 DIAGNOSIS — N186 End stage renal disease: Secondary | ICD-10-CM | POA: Diagnosis not present

## 2018-03-19 DIAGNOSIS — Z992 Dependence on renal dialysis: Secondary | ICD-10-CM | POA: Diagnosis not present

## 2018-03-19 DIAGNOSIS — D509 Iron deficiency anemia, unspecified: Secondary | ICD-10-CM | POA: Diagnosis not present

## 2018-03-19 DIAGNOSIS — N2581 Secondary hyperparathyroidism of renal origin: Secondary | ICD-10-CM | POA: Diagnosis not present

## 2018-03-19 DIAGNOSIS — N186 End stage renal disease: Secondary | ICD-10-CM | POA: Diagnosis not present

## 2018-03-20 DIAGNOSIS — D509 Iron deficiency anemia, unspecified: Secondary | ICD-10-CM | POA: Diagnosis not present

## 2018-03-20 DIAGNOSIS — Z992 Dependence on renal dialysis: Secondary | ICD-10-CM | POA: Diagnosis not present

## 2018-03-20 DIAGNOSIS — N2581 Secondary hyperparathyroidism of renal origin: Secondary | ICD-10-CM | POA: Diagnosis not present

## 2018-03-20 DIAGNOSIS — N186 End stage renal disease: Secondary | ICD-10-CM | POA: Diagnosis not present

## 2018-03-22 DIAGNOSIS — N2581 Secondary hyperparathyroidism of renal origin: Secondary | ICD-10-CM | POA: Diagnosis not present

## 2018-03-22 DIAGNOSIS — Z992 Dependence on renal dialysis: Secondary | ICD-10-CM | POA: Diagnosis not present

## 2018-03-22 DIAGNOSIS — N186 End stage renal disease: Secondary | ICD-10-CM | POA: Diagnosis not present

## 2018-03-22 DIAGNOSIS — D509 Iron deficiency anemia, unspecified: Secondary | ICD-10-CM | POA: Diagnosis not present

## 2018-03-23 DIAGNOSIS — Z992 Dependence on renal dialysis: Secondary | ICD-10-CM | POA: Diagnosis not present

## 2018-03-23 DIAGNOSIS — N186 End stage renal disease: Secondary | ICD-10-CM | POA: Diagnosis not present

## 2018-03-23 DIAGNOSIS — D509 Iron deficiency anemia, unspecified: Secondary | ICD-10-CM | POA: Diagnosis not present

## 2018-03-23 DIAGNOSIS — N2581 Secondary hyperparathyroidism of renal origin: Secondary | ICD-10-CM | POA: Diagnosis not present

## 2018-03-24 DIAGNOSIS — N2581 Secondary hyperparathyroidism of renal origin: Secondary | ICD-10-CM | POA: Diagnosis not present

## 2018-03-24 DIAGNOSIS — D509 Iron deficiency anemia, unspecified: Secondary | ICD-10-CM | POA: Diagnosis not present

## 2018-03-24 DIAGNOSIS — Z992 Dependence on renal dialysis: Secondary | ICD-10-CM | POA: Diagnosis not present

## 2018-03-24 DIAGNOSIS — N186 End stage renal disease: Secondary | ICD-10-CM | POA: Diagnosis not present

## 2018-03-25 DIAGNOSIS — N186 End stage renal disease: Secondary | ICD-10-CM | POA: Diagnosis not present

## 2018-03-25 DIAGNOSIS — Z992 Dependence on renal dialysis: Secondary | ICD-10-CM | POA: Diagnosis not present

## 2018-03-25 DIAGNOSIS — N2581 Secondary hyperparathyroidism of renal origin: Secondary | ICD-10-CM | POA: Diagnosis not present

## 2018-03-25 DIAGNOSIS — D509 Iron deficiency anemia, unspecified: Secondary | ICD-10-CM | POA: Diagnosis not present

## 2018-03-26 DIAGNOSIS — D509 Iron deficiency anemia, unspecified: Secondary | ICD-10-CM | POA: Diagnosis not present

## 2018-03-26 DIAGNOSIS — Z992 Dependence on renal dialysis: Secondary | ICD-10-CM | POA: Diagnosis not present

## 2018-03-26 DIAGNOSIS — N186 End stage renal disease: Secondary | ICD-10-CM | POA: Diagnosis not present

## 2018-03-26 DIAGNOSIS — N2581 Secondary hyperparathyroidism of renal origin: Secondary | ICD-10-CM | POA: Diagnosis not present

## 2018-03-27 DIAGNOSIS — N186 End stage renal disease: Secondary | ICD-10-CM | POA: Diagnosis not present

## 2018-03-27 DIAGNOSIS — Z992 Dependence on renal dialysis: Secondary | ICD-10-CM | POA: Diagnosis not present

## 2018-03-27 DIAGNOSIS — D509 Iron deficiency anemia, unspecified: Secondary | ICD-10-CM | POA: Diagnosis not present

## 2018-03-27 DIAGNOSIS — N2581 Secondary hyperparathyroidism of renal origin: Secondary | ICD-10-CM | POA: Diagnosis not present

## 2018-03-29 DIAGNOSIS — Z992 Dependence on renal dialysis: Secondary | ICD-10-CM | POA: Diagnosis not present

## 2018-03-29 DIAGNOSIS — D509 Iron deficiency anemia, unspecified: Secondary | ICD-10-CM | POA: Diagnosis not present

## 2018-03-29 DIAGNOSIS — N2581 Secondary hyperparathyroidism of renal origin: Secondary | ICD-10-CM | POA: Diagnosis not present

## 2018-03-29 DIAGNOSIS — N186 End stage renal disease: Secondary | ICD-10-CM | POA: Diagnosis not present

## 2018-03-30 DIAGNOSIS — D509 Iron deficiency anemia, unspecified: Secondary | ICD-10-CM | POA: Diagnosis not present

## 2018-03-30 DIAGNOSIS — Z992 Dependence on renal dialysis: Secondary | ICD-10-CM | POA: Diagnosis not present

## 2018-03-30 DIAGNOSIS — N2581 Secondary hyperparathyroidism of renal origin: Secondary | ICD-10-CM | POA: Diagnosis not present

## 2018-03-30 DIAGNOSIS — N186 End stage renal disease: Secondary | ICD-10-CM | POA: Diagnosis not present

## 2018-03-31 DIAGNOSIS — N186 End stage renal disease: Secondary | ICD-10-CM | POA: Diagnosis not present

## 2018-03-31 DIAGNOSIS — N2581 Secondary hyperparathyroidism of renal origin: Secondary | ICD-10-CM | POA: Diagnosis not present

## 2018-03-31 DIAGNOSIS — Z992 Dependence on renal dialysis: Secondary | ICD-10-CM | POA: Diagnosis not present

## 2018-03-31 DIAGNOSIS — D509 Iron deficiency anemia, unspecified: Secondary | ICD-10-CM | POA: Diagnosis not present

## 2018-04-01 DIAGNOSIS — D509 Iron deficiency anemia, unspecified: Secondary | ICD-10-CM | POA: Diagnosis not present

## 2018-04-01 DIAGNOSIS — Z992 Dependence on renal dialysis: Secondary | ICD-10-CM | POA: Diagnosis not present

## 2018-04-01 DIAGNOSIS — N186 End stage renal disease: Secondary | ICD-10-CM | POA: Diagnosis not present

## 2018-04-01 DIAGNOSIS — N2581 Secondary hyperparathyroidism of renal origin: Secondary | ICD-10-CM | POA: Diagnosis not present

## 2018-04-02 DIAGNOSIS — I480 Paroxysmal atrial fibrillation: Secondary | ICD-10-CM | POA: Diagnosis not present

## 2018-04-02 DIAGNOSIS — I2510 Atherosclerotic heart disease of native coronary artery without angina pectoris: Secondary | ICD-10-CM | POA: Diagnosis not present

## 2018-04-02 DIAGNOSIS — F4310 Post-traumatic stress disorder, unspecified: Secondary | ICD-10-CM | POA: Diagnosis not present

## 2018-04-02 DIAGNOSIS — E1100 Type 2 diabetes mellitus with hyperosmolarity without nonketotic hyperglycemic-hyperosmolar coma (NKHHC): Secondary | ICD-10-CM | POA: Diagnosis not present

## 2018-04-02 DIAGNOSIS — I493 Ventricular premature depolarization: Secondary | ICD-10-CM | POA: Diagnosis not present

## 2018-04-02 DIAGNOSIS — I519 Heart disease, unspecified: Secondary | ICD-10-CM | POA: Diagnosis not present

## 2018-04-02 DIAGNOSIS — R0602 Shortness of breath: Secondary | ICD-10-CM | POA: Diagnosis not present

## 2018-04-02 DIAGNOSIS — Z95810 Presence of automatic (implantable) cardiac defibrillator: Secondary | ICD-10-CM | POA: Diagnosis not present

## 2018-04-02 DIAGNOSIS — N183 Chronic kidney disease, stage 3 (moderate): Secondary | ICD-10-CM | POA: Diagnosis not present

## 2018-04-02 DIAGNOSIS — I255 Ischemic cardiomyopathy: Secondary | ICD-10-CM | POA: Diagnosis not present

## 2018-04-02 DIAGNOSIS — N2581 Secondary hyperparathyroidism of renal origin: Secondary | ICD-10-CM | POA: Diagnosis not present

## 2018-04-02 DIAGNOSIS — N186 End stage renal disease: Secondary | ICD-10-CM | POA: Diagnosis not present

## 2018-04-02 DIAGNOSIS — Z992 Dependence on renal dialysis: Secondary | ICD-10-CM | POA: Diagnosis not present

## 2018-04-02 DIAGNOSIS — I10 Essential (primary) hypertension: Secondary | ICD-10-CM | POA: Diagnosis not present

## 2018-04-02 DIAGNOSIS — D509 Iron deficiency anemia, unspecified: Secondary | ICD-10-CM | POA: Diagnosis not present

## 2018-04-03 DIAGNOSIS — Z992 Dependence on renal dialysis: Secondary | ICD-10-CM | POA: Diagnosis not present

## 2018-04-03 DIAGNOSIS — N186 End stage renal disease: Secondary | ICD-10-CM | POA: Diagnosis not present

## 2018-04-03 DIAGNOSIS — D509 Iron deficiency anemia, unspecified: Secondary | ICD-10-CM | POA: Diagnosis not present

## 2018-04-03 DIAGNOSIS — N2581 Secondary hyperparathyroidism of renal origin: Secondary | ICD-10-CM | POA: Diagnosis not present

## 2018-04-05 DIAGNOSIS — N2581 Secondary hyperparathyroidism of renal origin: Secondary | ICD-10-CM | POA: Diagnosis not present

## 2018-04-05 DIAGNOSIS — N186 End stage renal disease: Secondary | ICD-10-CM | POA: Diagnosis not present

## 2018-04-05 DIAGNOSIS — Z992 Dependence on renal dialysis: Secondary | ICD-10-CM | POA: Diagnosis not present

## 2018-04-05 DIAGNOSIS — D509 Iron deficiency anemia, unspecified: Secondary | ICD-10-CM | POA: Diagnosis not present

## 2018-04-06 DIAGNOSIS — D509 Iron deficiency anemia, unspecified: Secondary | ICD-10-CM | POA: Diagnosis not present

## 2018-04-06 DIAGNOSIS — N2581 Secondary hyperparathyroidism of renal origin: Secondary | ICD-10-CM | POA: Diagnosis not present

## 2018-04-06 DIAGNOSIS — Z992 Dependence on renal dialysis: Secondary | ICD-10-CM | POA: Diagnosis not present

## 2018-04-06 DIAGNOSIS — N186 End stage renal disease: Secondary | ICD-10-CM | POA: Diagnosis not present

## 2018-04-07 DIAGNOSIS — Z992 Dependence on renal dialysis: Secondary | ICD-10-CM | POA: Diagnosis not present

## 2018-04-07 DIAGNOSIS — N2581 Secondary hyperparathyroidism of renal origin: Secondary | ICD-10-CM | POA: Diagnosis not present

## 2018-04-07 DIAGNOSIS — D509 Iron deficiency anemia, unspecified: Secondary | ICD-10-CM | POA: Diagnosis not present

## 2018-04-07 DIAGNOSIS — N186 End stage renal disease: Secondary | ICD-10-CM | POA: Diagnosis not present

## 2018-04-08 DIAGNOSIS — L03115 Cellulitis of right lower limb: Secondary | ICD-10-CM | POA: Diagnosis not present

## 2018-04-08 DIAGNOSIS — D509 Iron deficiency anemia, unspecified: Secondary | ICD-10-CM | POA: Diagnosis not present

## 2018-04-08 DIAGNOSIS — I4891 Unspecified atrial fibrillation: Secondary | ICD-10-CM | POA: Diagnosis not present

## 2018-04-08 DIAGNOSIS — Z992 Dependence on renal dialysis: Secondary | ICD-10-CM | POA: Diagnosis not present

## 2018-04-08 DIAGNOSIS — M792 Neuralgia and neuritis, unspecified: Secondary | ICD-10-CM | POA: Diagnosis not present

## 2018-04-08 DIAGNOSIS — N186 End stage renal disease: Secondary | ICD-10-CM | POA: Diagnosis not present

## 2018-04-08 DIAGNOSIS — N2581 Secondary hyperparathyroidism of renal origin: Secondary | ICD-10-CM | POA: Diagnosis not present

## 2018-04-09 DIAGNOSIS — N2581 Secondary hyperparathyroidism of renal origin: Secondary | ICD-10-CM | POA: Diagnosis not present

## 2018-04-09 DIAGNOSIS — D509 Iron deficiency anemia, unspecified: Secondary | ICD-10-CM | POA: Diagnosis not present

## 2018-04-09 DIAGNOSIS — Z992 Dependence on renal dialysis: Secondary | ICD-10-CM | POA: Diagnosis not present

## 2018-04-09 DIAGNOSIS — N186 End stage renal disease: Secondary | ICD-10-CM | POA: Diagnosis not present

## 2018-04-10 DIAGNOSIS — N186 End stage renal disease: Secondary | ICD-10-CM | POA: Diagnosis not present

## 2018-04-10 DIAGNOSIS — Z992 Dependence on renal dialysis: Secondary | ICD-10-CM | POA: Diagnosis not present

## 2018-04-10 DIAGNOSIS — D509 Iron deficiency anemia, unspecified: Secondary | ICD-10-CM | POA: Diagnosis not present

## 2018-04-10 DIAGNOSIS — N2581 Secondary hyperparathyroidism of renal origin: Secondary | ICD-10-CM | POA: Diagnosis not present

## 2018-04-12 DIAGNOSIS — I119 Hypertensive heart disease without heart failure: Secondary | ICD-10-CM | POA: Diagnosis not present

## 2018-04-12 DIAGNOSIS — D509 Iron deficiency anemia, unspecified: Secondary | ICD-10-CM | POA: Diagnosis not present

## 2018-04-12 DIAGNOSIS — N186 End stage renal disease: Secondary | ICD-10-CM | POA: Diagnosis not present

## 2018-04-12 DIAGNOSIS — Z992 Dependence on renal dialysis: Secondary | ICD-10-CM | POA: Diagnosis not present

## 2018-04-12 DIAGNOSIS — N2581 Secondary hyperparathyroidism of renal origin: Secondary | ICD-10-CM | POA: Diagnosis not present

## 2018-04-12 DIAGNOSIS — E8881 Metabolic syndrome: Secondary | ICD-10-CM | POA: Diagnosis not present

## 2018-04-13 DIAGNOSIS — N186 End stage renal disease: Secondary | ICD-10-CM | POA: Diagnosis not present

## 2018-04-13 DIAGNOSIS — N2581 Secondary hyperparathyroidism of renal origin: Secondary | ICD-10-CM | POA: Diagnosis not present

## 2018-04-13 DIAGNOSIS — Z992 Dependence on renal dialysis: Secondary | ICD-10-CM | POA: Diagnosis not present

## 2018-04-13 DIAGNOSIS — D509 Iron deficiency anemia, unspecified: Secondary | ICD-10-CM | POA: Diagnosis not present

## 2018-04-14 DIAGNOSIS — N2581 Secondary hyperparathyroidism of renal origin: Secondary | ICD-10-CM | POA: Diagnosis not present

## 2018-04-14 DIAGNOSIS — Z992 Dependence on renal dialysis: Secondary | ICD-10-CM | POA: Diagnosis not present

## 2018-04-14 DIAGNOSIS — D509 Iron deficiency anemia, unspecified: Secondary | ICD-10-CM | POA: Diagnosis not present

## 2018-04-14 DIAGNOSIS — N186 End stage renal disease: Secondary | ICD-10-CM | POA: Diagnosis not present

## 2018-04-15 DIAGNOSIS — D509 Iron deficiency anemia, unspecified: Secondary | ICD-10-CM | POA: Diagnosis not present

## 2018-04-15 DIAGNOSIS — N2581 Secondary hyperparathyroidism of renal origin: Secondary | ICD-10-CM | POA: Diagnosis not present

## 2018-04-15 DIAGNOSIS — Z992 Dependence on renal dialysis: Secondary | ICD-10-CM | POA: Diagnosis not present

## 2018-04-15 DIAGNOSIS — N186 End stage renal disease: Secondary | ICD-10-CM | POA: Diagnosis not present

## 2018-04-16 DIAGNOSIS — N2581 Secondary hyperparathyroidism of renal origin: Secondary | ICD-10-CM | POA: Diagnosis not present

## 2018-04-16 DIAGNOSIS — N186 End stage renal disease: Secondary | ICD-10-CM | POA: Diagnosis not present

## 2018-04-16 DIAGNOSIS — Z992 Dependence on renal dialysis: Secondary | ICD-10-CM | POA: Diagnosis not present

## 2018-04-16 DIAGNOSIS — D509 Iron deficiency anemia, unspecified: Secondary | ICD-10-CM | POA: Diagnosis not present

## 2018-04-17 DIAGNOSIS — N2581 Secondary hyperparathyroidism of renal origin: Secondary | ICD-10-CM | POA: Diagnosis not present

## 2018-04-17 DIAGNOSIS — Z992 Dependence on renal dialysis: Secondary | ICD-10-CM | POA: Diagnosis not present

## 2018-04-17 DIAGNOSIS — N186 End stage renal disease: Secondary | ICD-10-CM | POA: Diagnosis not present

## 2018-04-17 DIAGNOSIS — D509 Iron deficiency anemia, unspecified: Secondary | ICD-10-CM | POA: Diagnosis not present

## 2018-04-18 DIAGNOSIS — Z992 Dependence on renal dialysis: Secondary | ICD-10-CM | POA: Diagnosis not present

## 2018-04-18 DIAGNOSIS — N186 End stage renal disease: Secondary | ICD-10-CM | POA: Diagnosis not present

## 2018-04-19 DIAGNOSIS — D509 Iron deficiency anemia, unspecified: Secondary | ICD-10-CM | POA: Diagnosis not present

## 2018-04-19 DIAGNOSIS — N2581 Secondary hyperparathyroidism of renal origin: Secondary | ICD-10-CM | POA: Diagnosis not present

## 2018-04-19 DIAGNOSIS — Z992 Dependence on renal dialysis: Secondary | ICD-10-CM | POA: Diagnosis not present

## 2018-04-19 DIAGNOSIS — N186 End stage renal disease: Secondary | ICD-10-CM | POA: Diagnosis not present

## 2018-04-20 DIAGNOSIS — D509 Iron deficiency anemia, unspecified: Secondary | ICD-10-CM | POA: Diagnosis not present

## 2018-04-20 DIAGNOSIS — N186 End stage renal disease: Secondary | ICD-10-CM | POA: Diagnosis not present

## 2018-04-20 DIAGNOSIS — N2581 Secondary hyperparathyroidism of renal origin: Secondary | ICD-10-CM | POA: Diagnosis not present

## 2018-04-20 DIAGNOSIS — Z992 Dependence on renal dialysis: Secondary | ICD-10-CM | POA: Diagnosis not present

## 2018-04-21 DIAGNOSIS — Z992 Dependence on renal dialysis: Secondary | ICD-10-CM | POA: Diagnosis not present

## 2018-04-21 DIAGNOSIS — D509 Iron deficiency anemia, unspecified: Secondary | ICD-10-CM | POA: Diagnosis not present

## 2018-04-21 DIAGNOSIS — N2581 Secondary hyperparathyroidism of renal origin: Secondary | ICD-10-CM | POA: Diagnosis not present

## 2018-04-21 DIAGNOSIS — N186 End stage renal disease: Secondary | ICD-10-CM | POA: Diagnosis not present

## 2018-04-22 DIAGNOSIS — N2581 Secondary hyperparathyroidism of renal origin: Secondary | ICD-10-CM | POA: Diagnosis not present

## 2018-04-22 DIAGNOSIS — Z992 Dependence on renal dialysis: Secondary | ICD-10-CM | POA: Diagnosis not present

## 2018-04-22 DIAGNOSIS — N186 End stage renal disease: Secondary | ICD-10-CM | POA: Diagnosis not present

## 2018-04-22 DIAGNOSIS — D509 Iron deficiency anemia, unspecified: Secondary | ICD-10-CM | POA: Diagnosis not present

## 2018-04-23 DIAGNOSIS — N2581 Secondary hyperparathyroidism of renal origin: Secondary | ICD-10-CM | POA: Diagnosis not present

## 2018-04-23 DIAGNOSIS — Z992 Dependence on renal dialysis: Secondary | ICD-10-CM | POA: Diagnosis not present

## 2018-04-23 DIAGNOSIS — N186 End stage renal disease: Secondary | ICD-10-CM | POA: Diagnosis not present

## 2018-04-23 DIAGNOSIS — D509 Iron deficiency anemia, unspecified: Secondary | ICD-10-CM | POA: Diagnosis not present

## 2018-04-24 DIAGNOSIS — Z992 Dependence on renal dialysis: Secondary | ICD-10-CM | POA: Diagnosis not present

## 2018-04-24 DIAGNOSIS — N2581 Secondary hyperparathyroidism of renal origin: Secondary | ICD-10-CM | POA: Diagnosis not present

## 2018-04-24 DIAGNOSIS — N186 End stage renal disease: Secondary | ICD-10-CM | POA: Diagnosis not present

## 2018-04-24 DIAGNOSIS — D509 Iron deficiency anemia, unspecified: Secondary | ICD-10-CM | POA: Diagnosis not present

## 2018-04-26 DIAGNOSIS — B182 Chronic viral hepatitis C: Secondary | ICD-10-CM | POA: Diagnosis not present

## 2018-04-26 DIAGNOSIS — I509 Heart failure, unspecified: Secondary | ICD-10-CM | POA: Diagnosis not present

## 2018-04-26 DIAGNOSIS — N186 End stage renal disease: Secondary | ICD-10-CM | POA: Diagnosis not present

## 2018-04-26 DIAGNOSIS — I119 Hypertensive heart disease without heart failure: Secondary | ICD-10-CM | POA: Diagnosis not present

## 2018-04-26 DIAGNOSIS — N2581 Secondary hyperparathyroidism of renal origin: Secondary | ICD-10-CM | POA: Diagnosis not present

## 2018-04-26 DIAGNOSIS — I4891 Unspecified atrial fibrillation: Secondary | ICD-10-CM | POA: Diagnosis not present

## 2018-04-26 DIAGNOSIS — K7689 Other specified diseases of liver: Secondary | ICD-10-CM | POA: Diagnosis not present

## 2018-04-26 DIAGNOSIS — E119 Type 2 diabetes mellitus without complications: Secondary | ICD-10-CM | POA: Diagnosis not present

## 2018-04-26 DIAGNOSIS — D509 Iron deficiency anemia, unspecified: Secondary | ICD-10-CM | POA: Diagnosis not present

## 2018-04-26 DIAGNOSIS — Z992 Dependence on renal dialysis: Secondary | ICD-10-CM | POA: Diagnosis not present

## 2018-04-27 DIAGNOSIS — E119 Type 2 diabetes mellitus without complications: Secondary | ICD-10-CM | POA: Diagnosis not present

## 2018-04-27 DIAGNOSIS — N186 End stage renal disease: Secondary | ICD-10-CM | POA: Diagnosis not present

## 2018-04-27 DIAGNOSIS — N2581 Secondary hyperparathyroidism of renal origin: Secondary | ICD-10-CM | POA: Diagnosis not present

## 2018-04-27 DIAGNOSIS — Z992 Dependence on renal dialysis: Secondary | ICD-10-CM | POA: Diagnosis not present

## 2018-04-27 DIAGNOSIS — D509 Iron deficiency anemia, unspecified: Secondary | ICD-10-CM | POA: Diagnosis not present

## 2018-04-28 DIAGNOSIS — Z992 Dependence on renal dialysis: Secondary | ICD-10-CM | POA: Diagnosis not present

## 2018-04-28 DIAGNOSIS — D509 Iron deficiency anemia, unspecified: Secondary | ICD-10-CM | POA: Diagnosis not present

## 2018-04-28 DIAGNOSIS — N186 End stage renal disease: Secondary | ICD-10-CM | POA: Diagnosis not present

## 2018-04-28 DIAGNOSIS — N2581 Secondary hyperparathyroidism of renal origin: Secondary | ICD-10-CM | POA: Diagnosis not present

## 2018-04-29 DIAGNOSIS — D509 Iron deficiency anemia, unspecified: Secondary | ICD-10-CM | POA: Diagnosis not present

## 2018-04-29 DIAGNOSIS — N186 End stage renal disease: Secondary | ICD-10-CM | POA: Diagnosis not present

## 2018-04-29 DIAGNOSIS — Z992 Dependence on renal dialysis: Secondary | ICD-10-CM | POA: Diagnosis not present

## 2018-04-29 DIAGNOSIS — N2581 Secondary hyperparathyroidism of renal origin: Secondary | ICD-10-CM | POA: Diagnosis not present

## 2018-04-30 DIAGNOSIS — N2581 Secondary hyperparathyroidism of renal origin: Secondary | ICD-10-CM | POA: Diagnosis not present

## 2018-04-30 DIAGNOSIS — Z992 Dependence on renal dialysis: Secondary | ICD-10-CM | POA: Diagnosis not present

## 2018-04-30 DIAGNOSIS — D509 Iron deficiency anemia, unspecified: Secondary | ICD-10-CM | POA: Diagnosis not present

## 2018-04-30 DIAGNOSIS — N186 End stage renal disease: Secondary | ICD-10-CM | POA: Diagnosis not present

## 2018-05-01 DIAGNOSIS — N2581 Secondary hyperparathyroidism of renal origin: Secondary | ICD-10-CM | POA: Diagnosis not present

## 2018-05-01 DIAGNOSIS — N186 End stage renal disease: Secondary | ICD-10-CM | POA: Diagnosis not present

## 2018-05-01 DIAGNOSIS — D509 Iron deficiency anemia, unspecified: Secondary | ICD-10-CM | POA: Diagnosis not present

## 2018-05-01 DIAGNOSIS — Z992 Dependence on renal dialysis: Secondary | ICD-10-CM | POA: Diagnosis not present

## 2018-05-03 DIAGNOSIS — D509 Iron deficiency anemia, unspecified: Secondary | ICD-10-CM | POA: Diagnosis not present

## 2018-05-03 DIAGNOSIS — N186 End stage renal disease: Secondary | ICD-10-CM | POA: Diagnosis not present

## 2018-05-03 DIAGNOSIS — N2581 Secondary hyperparathyroidism of renal origin: Secondary | ICD-10-CM | POA: Diagnosis not present

## 2018-05-03 DIAGNOSIS — Z992 Dependence on renal dialysis: Secondary | ICD-10-CM | POA: Diagnosis not present

## 2018-05-05 DIAGNOSIS — Z992 Dependence on renal dialysis: Secondary | ICD-10-CM | POA: Diagnosis not present

## 2018-05-05 DIAGNOSIS — N2581 Secondary hyperparathyroidism of renal origin: Secondary | ICD-10-CM | POA: Diagnosis not present

## 2018-05-05 DIAGNOSIS — N186 End stage renal disease: Secondary | ICD-10-CM | POA: Diagnosis not present

## 2018-05-05 DIAGNOSIS — D509 Iron deficiency anemia, unspecified: Secondary | ICD-10-CM | POA: Diagnosis not present

## 2018-05-06 DIAGNOSIS — D509 Iron deficiency anemia, unspecified: Secondary | ICD-10-CM | POA: Diagnosis not present

## 2018-05-06 DIAGNOSIS — N2581 Secondary hyperparathyroidism of renal origin: Secondary | ICD-10-CM | POA: Diagnosis not present

## 2018-05-06 DIAGNOSIS — Z992 Dependence on renal dialysis: Secondary | ICD-10-CM | POA: Diagnosis not present

## 2018-05-06 DIAGNOSIS — N186 End stage renal disease: Secondary | ICD-10-CM | POA: Diagnosis not present

## 2018-05-07 DIAGNOSIS — N2581 Secondary hyperparathyroidism of renal origin: Secondary | ICD-10-CM | POA: Diagnosis not present

## 2018-05-07 DIAGNOSIS — N186 End stage renal disease: Secondary | ICD-10-CM | POA: Diagnosis not present

## 2018-05-07 DIAGNOSIS — D509 Iron deficiency anemia, unspecified: Secondary | ICD-10-CM | POA: Diagnosis not present

## 2018-05-07 DIAGNOSIS — Z992 Dependence on renal dialysis: Secondary | ICD-10-CM | POA: Diagnosis not present

## 2018-05-08 DIAGNOSIS — N2581 Secondary hyperparathyroidism of renal origin: Secondary | ICD-10-CM | POA: Diagnosis not present

## 2018-05-08 DIAGNOSIS — Z992 Dependence on renal dialysis: Secondary | ICD-10-CM | POA: Diagnosis not present

## 2018-05-08 DIAGNOSIS — D509 Iron deficiency anemia, unspecified: Secondary | ICD-10-CM | POA: Diagnosis not present

## 2018-05-08 DIAGNOSIS — N186 End stage renal disease: Secondary | ICD-10-CM | POA: Diagnosis not present

## 2018-05-10 DIAGNOSIS — N186 End stage renal disease: Secondary | ICD-10-CM | POA: Diagnosis not present

## 2018-05-10 DIAGNOSIS — D509 Iron deficiency anemia, unspecified: Secondary | ICD-10-CM | POA: Diagnosis not present

## 2018-05-10 DIAGNOSIS — Z992 Dependence on renal dialysis: Secondary | ICD-10-CM | POA: Diagnosis not present

## 2018-05-10 DIAGNOSIS — N2581 Secondary hyperparathyroidism of renal origin: Secondary | ICD-10-CM | POA: Diagnosis not present

## 2018-05-12 DIAGNOSIS — N186 End stage renal disease: Secondary | ICD-10-CM | POA: Diagnosis not present

## 2018-05-12 DIAGNOSIS — Z992 Dependence on renal dialysis: Secondary | ICD-10-CM | POA: Diagnosis not present

## 2018-05-12 DIAGNOSIS — N2581 Secondary hyperparathyroidism of renal origin: Secondary | ICD-10-CM | POA: Diagnosis not present

## 2018-05-12 DIAGNOSIS — D509 Iron deficiency anemia, unspecified: Secondary | ICD-10-CM | POA: Diagnosis not present

## 2018-05-13 DIAGNOSIS — N186 End stage renal disease: Secondary | ICD-10-CM | POA: Diagnosis not present

## 2018-05-13 DIAGNOSIS — N2581 Secondary hyperparathyroidism of renal origin: Secondary | ICD-10-CM | POA: Diagnosis not present

## 2018-05-13 DIAGNOSIS — D509 Iron deficiency anemia, unspecified: Secondary | ICD-10-CM | POA: Diagnosis not present

## 2018-05-13 DIAGNOSIS — Z992 Dependence on renal dialysis: Secondary | ICD-10-CM | POA: Diagnosis not present

## 2018-05-14 DIAGNOSIS — D509 Iron deficiency anemia, unspecified: Secondary | ICD-10-CM | POA: Diagnosis not present

## 2018-05-14 DIAGNOSIS — N2581 Secondary hyperparathyroidism of renal origin: Secondary | ICD-10-CM | POA: Diagnosis not present

## 2018-05-14 DIAGNOSIS — Z992 Dependence on renal dialysis: Secondary | ICD-10-CM | POA: Diagnosis not present

## 2018-05-14 DIAGNOSIS — N186 End stage renal disease: Secondary | ICD-10-CM | POA: Diagnosis not present

## 2018-05-15 DIAGNOSIS — Z992 Dependence on renal dialysis: Secondary | ICD-10-CM | POA: Diagnosis not present

## 2018-05-15 DIAGNOSIS — D509 Iron deficiency anemia, unspecified: Secondary | ICD-10-CM | POA: Diagnosis not present

## 2018-05-15 DIAGNOSIS — N186 End stage renal disease: Secondary | ICD-10-CM | POA: Diagnosis not present

## 2018-05-15 DIAGNOSIS — N2581 Secondary hyperparathyroidism of renal origin: Secondary | ICD-10-CM | POA: Diagnosis not present

## 2018-05-18 DIAGNOSIS — Z992 Dependence on renal dialysis: Secondary | ICD-10-CM | POA: Diagnosis not present

## 2018-05-18 DIAGNOSIS — D509 Iron deficiency anemia, unspecified: Secondary | ICD-10-CM | POA: Diagnosis not present

## 2018-05-18 DIAGNOSIS — N2581 Secondary hyperparathyroidism of renal origin: Secondary | ICD-10-CM | POA: Diagnosis not present

## 2018-05-18 DIAGNOSIS — N186 End stage renal disease: Secondary | ICD-10-CM | POA: Diagnosis not present

## 2018-05-19 DIAGNOSIS — Z992 Dependence on renal dialysis: Secondary | ICD-10-CM | POA: Diagnosis not present

## 2018-05-19 DIAGNOSIS — N186 End stage renal disease: Secondary | ICD-10-CM | POA: Diagnosis not present

## 2018-05-19 DIAGNOSIS — N2581 Secondary hyperparathyroidism of renal origin: Secondary | ICD-10-CM | POA: Diagnosis not present

## 2018-05-19 DIAGNOSIS — D509 Iron deficiency anemia, unspecified: Secondary | ICD-10-CM | POA: Diagnosis not present

## 2018-05-20 DIAGNOSIS — Z992 Dependence on renal dialysis: Secondary | ICD-10-CM | POA: Diagnosis not present

## 2018-05-20 DIAGNOSIS — N2581 Secondary hyperparathyroidism of renal origin: Secondary | ICD-10-CM | POA: Diagnosis not present

## 2018-05-20 DIAGNOSIS — N186 End stage renal disease: Secondary | ICD-10-CM | POA: Diagnosis not present

## 2018-05-20 DIAGNOSIS — D509 Iron deficiency anemia, unspecified: Secondary | ICD-10-CM | POA: Diagnosis not present

## 2018-05-21 DIAGNOSIS — N2581 Secondary hyperparathyroidism of renal origin: Secondary | ICD-10-CM | POA: Diagnosis not present

## 2018-05-21 DIAGNOSIS — D509 Iron deficiency anemia, unspecified: Secondary | ICD-10-CM | POA: Diagnosis not present

## 2018-05-21 DIAGNOSIS — N186 End stage renal disease: Secondary | ICD-10-CM | POA: Diagnosis not present

## 2018-05-21 DIAGNOSIS — Z992 Dependence on renal dialysis: Secondary | ICD-10-CM | POA: Diagnosis not present

## 2018-05-22 DIAGNOSIS — N2581 Secondary hyperparathyroidism of renal origin: Secondary | ICD-10-CM | POA: Diagnosis not present

## 2018-05-22 DIAGNOSIS — N186 End stage renal disease: Secondary | ICD-10-CM | POA: Diagnosis not present

## 2018-05-22 DIAGNOSIS — Z992 Dependence on renal dialysis: Secondary | ICD-10-CM | POA: Diagnosis not present

## 2018-05-22 DIAGNOSIS — D509 Iron deficiency anemia, unspecified: Secondary | ICD-10-CM | POA: Diagnosis not present

## 2018-05-24 DIAGNOSIS — N186 End stage renal disease: Secondary | ICD-10-CM | POA: Diagnosis not present

## 2018-05-24 DIAGNOSIS — Z992 Dependence on renal dialysis: Secondary | ICD-10-CM | POA: Diagnosis not present

## 2018-05-24 DIAGNOSIS — D509 Iron deficiency anemia, unspecified: Secondary | ICD-10-CM | POA: Diagnosis not present

## 2018-05-24 DIAGNOSIS — N2581 Secondary hyperparathyroidism of renal origin: Secondary | ICD-10-CM | POA: Diagnosis not present

## 2018-05-25 DIAGNOSIS — N2581 Secondary hyperparathyroidism of renal origin: Secondary | ICD-10-CM | POA: Diagnosis not present

## 2018-05-25 DIAGNOSIS — N186 End stage renal disease: Secondary | ICD-10-CM | POA: Diagnosis not present

## 2018-05-25 DIAGNOSIS — Z992 Dependence on renal dialysis: Secondary | ICD-10-CM | POA: Diagnosis not present

## 2018-05-25 DIAGNOSIS — D509 Iron deficiency anemia, unspecified: Secondary | ICD-10-CM | POA: Diagnosis not present

## 2018-05-26 DIAGNOSIS — N186 End stage renal disease: Secondary | ICD-10-CM | POA: Diagnosis not present

## 2018-05-26 DIAGNOSIS — D509 Iron deficiency anemia, unspecified: Secondary | ICD-10-CM | POA: Diagnosis not present

## 2018-05-26 DIAGNOSIS — N2581 Secondary hyperparathyroidism of renal origin: Secondary | ICD-10-CM | POA: Diagnosis not present

## 2018-05-26 DIAGNOSIS — Z992 Dependence on renal dialysis: Secondary | ICD-10-CM | POA: Diagnosis not present

## 2018-05-28 DIAGNOSIS — N186 End stage renal disease: Secondary | ICD-10-CM | POA: Diagnosis not present

## 2018-05-28 DIAGNOSIS — N2581 Secondary hyperparathyroidism of renal origin: Secondary | ICD-10-CM | POA: Diagnosis not present

## 2018-05-28 DIAGNOSIS — Z992 Dependence on renal dialysis: Secondary | ICD-10-CM | POA: Diagnosis not present

## 2018-05-28 DIAGNOSIS — D509 Iron deficiency anemia, unspecified: Secondary | ICD-10-CM | POA: Diagnosis not present

## 2018-05-29 DIAGNOSIS — D509 Iron deficiency anemia, unspecified: Secondary | ICD-10-CM | POA: Diagnosis not present

## 2018-05-29 DIAGNOSIS — Z992 Dependence on renal dialysis: Secondary | ICD-10-CM | POA: Diagnosis not present

## 2018-05-29 DIAGNOSIS — N2581 Secondary hyperparathyroidism of renal origin: Secondary | ICD-10-CM | POA: Diagnosis not present

## 2018-05-29 DIAGNOSIS — N186 End stage renal disease: Secondary | ICD-10-CM | POA: Diagnosis not present

## 2018-06-01 DIAGNOSIS — Z992 Dependence on renal dialysis: Secondary | ICD-10-CM | POA: Diagnosis not present

## 2018-06-01 DIAGNOSIS — N186 End stage renal disease: Secondary | ICD-10-CM | POA: Diagnosis not present

## 2018-06-01 DIAGNOSIS — N2581 Secondary hyperparathyroidism of renal origin: Secondary | ICD-10-CM | POA: Diagnosis not present

## 2018-06-01 DIAGNOSIS — D509 Iron deficiency anemia, unspecified: Secondary | ICD-10-CM | POA: Diagnosis not present

## 2018-06-02 DIAGNOSIS — N186 End stage renal disease: Secondary | ICD-10-CM | POA: Diagnosis not present

## 2018-06-02 DIAGNOSIS — Z992 Dependence on renal dialysis: Secondary | ICD-10-CM | POA: Diagnosis not present

## 2018-06-02 DIAGNOSIS — D509 Iron deficiency anemia, unspecified: Secondary | ICD-10-CM | POA: Diagnosis not present

## 2018-06-02 DIAGNOSIS — N2581 Secondary hyperparathyroidism of renal origin: Secondary | ICD-10-CM | POA: Diagnosis not present

## 2018-06-03 DIAGNOSIS — Z992 Dependence on renal dialysis: Secondary | ICD-10-CM | POA: Diagnosis not present

## 2018-06-03 DIAGNOSIS — N2581 Secondary hyperparathyroidism of renal origin: Secondary | ICD-10-CM | POA: Diagnosis not present

## 2018-06-03 DIAGNOSIS — N186 End stage renal disease: Secondary | ICD-10-CM | POA: Diagnosis not present

## 2018-06-03 DIAGNOSIS — D509 Iron deficiency anemia, unspecified: Secondary | ICD-10-CM | POA: Diagnosis not present

## 2018-06-04 DIAGNOSIS — Z992 Dependence on renal dialysis: Secondary | ICD-10-CM | POA: Diagnosis not present

## 2018-06-04 DIAGNOSIS — I119 Hypertensive heart disease without heart failure: Secondary | ICD-10-CM | POA: Diagnosis not present

## 2018-06-04 DIAGNOSIS — D509 Iron deficiency anemia, unspecified: Secondary | ICD-10-CM | POA: Diagnosis not present

## 2018-06-04 DIAGNOSIS — K7689 Other specified diseases of liver: Secondary | ICD-10-CM | POA: Diagnosis not present

## 2018-06-04 DIAGNOSIS — I4891 Unspecified atrial fibrillation: Secondary | ICD-10-CM | POA: Diagnosis not present

## 2018-06-04 DIAGNOSIS — N186 End stage renal disease: Secondary | ICD-10-CM | POA: Diagnosis not present

## 2018-06-04 DIAGNOSIS — T375X5A Adverse effect of antiviral drugs, initial encounter: Secondary | ICD-10-CM | POA: Diagnosis not present

## 2018-06-04 DIAGNOSIS — N2581 Secondary hyperparathyroidism of renal origin: Secondary | ICD-10-CM | POA: Diagnosis not present

## 2018-06-05 DIAGNOSIS — D509 Iron deficiency anemia, unspecified: Secondary | ICD-10-CM | POA: Diagnosis not present

## 2018-06-05 DIAGNOSIS — N186 End stage renal disease: Secondary | ICD-10-CM | POA: Diagnosis not present

## 2018-06-05 DIAGNOSIS — N2581 Secondary hyperparathyroidism of renal origin: Secondary | ICD-10-CM | POA: Diagnosis not present

## 2018-06-05 DIAGNOSIS — Z992 Dependence on renal dialysis: Secondary | ICD-10-CM | POA: Diagnosis not present

## 2018-06-07 DIAGNOSIS — D509 Iron deficiency anemia, unspecified: Secondary | ICD-10-CM | POA: Diagnosis not present

## 2018-06-07 DIAGNOSIS — Z992 Dependence on renal dialysis: Secondary | ICD-10-CM | POA: Diagnosis not present

## 2018-06-07 DIAGNOSIS — N2581 Secondary hyperparathyroidism of renal origin: Secondary | ICD-10-CM | POA: Diagnosis not present

## 2018-06-07 DIAGNOSIS — N186 End stage renal disease: Secondary | ICD-10-CM | POA: Diagnosis not present

## 2018-06-08 DIAGNOSIS — Z992 Dependence on renal dialysis: Secondary | ICD-10-CM | POA: Diagnosis not present

## 2018-06-08 DIAGNOSIS — D509 Iron deficiency anemia, unspecified: Secondary | ICD-10-CM | POA: Diagnosis not present

## 2018-06-08 DIAGNOSIS — N2581 Secondary hyperparathyroidism of renal origin: Secondary | ICD-10-CM | POA: Diagnosis not present

## 2018-06-08 DIAGNOSIS — N186 End stage renal disease: Secondary | ICD-10-CM | POA: Diagnosis not present

## 2018-06-09 DIAGNOSIS — Z992 Dependence on renal dialysis: Secondary | ICD-10-CM | POA: Diagnosis not present

## 2018-06-09 DIAGNOSIS — D509 Iron deficiency anemia, unspecified: Secondary | ICD-10-CM | POA: Diagnosis not present

## 2018-06-09 DIAGNOSIS — N2581 Secondary hyperparathyroidism of renal origin: Secondary | ICD-10-CM | POA: Diagnosis not present

## 2018-06-09 DIAGNOSIS — N186 End stage renal disease: Secondary | ICD-10-CM | POA: Diagnosis not present

## 2018-06-10 DIAGNOSIS — N186 End stage renal disease: Secondary | ICD-10-CM | POA: Diagnosis not present

## 2018-06-10 DIAGNOSIS — D509 Iron deficiency anemia, unspecified: Secondary | ICD-10-CM | POA: Diagnosis not present

## 2018-06-10 DIAGNOSIS — N2581 Secondary hyperparathyroidism of renal origin: Secondary | ICD-10-CM | POA: Diagnosis not present

## 2018-06-10 DIAGNOSIS — Z992 Dependence on renal dialysis: Secondary | ICD-10-CM | POA: Diagnosis not present

## 2018-06-11 DIAGNOSIS — N186 End stage renal disease: Secondary | ICD-10-CM | POA: Diagnosis not present

## 2018-06-11 DIAGNOSIS — Z992 Dependence on renal dialysis: Secondary | ICD-10-CM | POA: Diagnosis not present

## 2018-06-11 DIAGNOSIS — N2581 Secondary hyperparathyroidism of renal origin: Secondary | ICD-10-CM | POA: Diagnosis not present

## 2018-06-11 DIAGNOSIS — D509 Iron deficiency anemia, unspecified: Secondary | ICD-10-CM | POA: Diagnosis not present

## 2018-06-12 DIAGNOSIS — N186 End stage renal disease: Secondary | ICD-10-CM | POA: Diagnosis not present

## 2018-06-12 DIAGNOSIS — Z992 Dependence on renal dialysis: Secondary | ICD-10-CM | POA: Diagnosis not present

## 2018-06-12 DIAGNOSIS — N2581 Secondary hyperparathyroidism of renal origin: Secondary | ICD-10-CM | POA: Diagnosis not present

## 2018-06-12 DIAGNOSIS — D509 Iron deficiency anemia, unspecified: Secondary | ICD-10-CM | POA: Diagnosis not present

## 2018-06-14 DIAGNOSIS — Z992 Dependence on renal dialysis: Secondary | ICD-10-CM | POA: Diagnosis not present

## 2018-06-14 DIAGNOSIS — N186 End stage renal disease: Secondary | ICD-10-CM | POA: Diagnosis not present

## 2018-06-14 DIAGNOSIS — N2581 Secondary hyperparathyroidism of renal origin: Secondary | ICD-10-CM | POA: Diagnosis not present

## 2018-06-14 DIAGNOSIS — D509 Iron deficiency anemia, unspecified: Secondary | ICD-10-CM | POA: Diagnosis not present

## 2018-06-15 DIAGNOSIS — N2581 Secondary hyperparathyroidism of renal origin: Secondary | ICD-10-CM | POA: Diagnosis not present

## 2018-06-15 DIAGNOSIS — Z992 Dependence on renal dialysis: Secondary | ICD-10-CM | POA: Diagnosis not present

## 2018-06-15 DIAGNOSIS — N186 End stage renal disease: Secondary | ICD-10-CM | POA: Diagnosis not present

## 2018-06-15 DIAGNOSIS — D509 Iron deficiency anemia, unspecified: Secondary | ICD-10-CM | POA: Diagnosis not present

## 2018-06-16 DIAGNOSIS — N2581 Secondary hyperparathyroidism of renal origin: Secondary | ICD-10-CM | POA: Diagnosis not present

## 2018-06-16 DIAGNOSIS — D509 Iron deficiency anemia, unspecified: Secondary | ICD-10-CM | POA: Diagnosis not present

## 2018-06-16 DIAGNOSIS — Z992 Dependence on renal dialysis: Secondary | ICD-10-CM | POA: Diagnosis not present

## 2018-06-16 DIAGNOSIS — N186 End stage renal disease: Secondary | ICD-10-CM | POA: Diagnosis not present

## 2018-06-17 DIAGNOSIS — N186 End stage renal disease: Secondary | ICD-10-CM | POA: Diagnosis not present

## 2018-06-17 DIAGNOSIS — D509 Iron deficiency anemia, unspecified: Secondary | ICD-10-CM | POA: Diagnosis not present

## 2018-06-17 DIAGNOSIS — N2581 Secondary hyperparathyroidism of renal origin: Secondary | ICD-10-CM | POA: Diagnosis not present

## 2018-06-17 DIAGNOSIS — Z992 Dependence on renal dialysis: Secondary | ICD-10-CM | POA: Diagnosis not present

## 2018-06-18 DIAGNOSIS — D509 Iron deficiency anemia, unspecified: Secondary | ICD-10-CM | POA: Diagnosis not present

## 2018-06-18 DIAGNOSIS — Z992 Dependence on renal dialysis: Secondary | ICD-10-CM | POA: Diagnosis not present

## 2018-06-18 DIAGNOSIS — N186 End stage renal disease: Secondary | ICD-10-CM | POA: Diagnosis not present

## 2018-06-18 DIAGNOSIS — N2581 Secondary hyperparathyroidism of renal origin: Secondary | ICD-10-CM | POA: Diagnosis not present

## 2018-06-19 DIAGNOSIS — N186 End stage renal disease: Secondary | ICD-10-CM | POA: Diagnosis not present

## 2018-06-19 DIAGNOSIS — N2581 Secondary hyperparathyroidism of renal origin: Secondary | ICD-10-CM | POA: Diagnosis not present

## 2018-06-19 DIAGNOSIS — Z992 Dependence on renal dialysis: Secondary | ICD-10-CM | POA: Diagnosis not present

## 2018-06-19 DIAGNOSIS — D509 Iron deficiency anemia, unspecified: Secondary | ICD-10-CM | POA: Diagnosis not present

## 2018-06-21 DIAGNOSIS — D509 Iron deficiency anemia, unspecified: Secondary | ICD-10-CM | POA: Diagnosis not present

## 2018-06-21 DIAGNOSIS — N186 End stage renal disease: Secondary | ICD-10-CM | POA: Diagnosis not present

## 2018-06-21 DIAGNOSIS — N2581 Secondary hyperparathyroidism of renal origin: Secondary | ICD-10-CM | POA: Diagnosis not present

## 2018-06-21 DIAGNOSIS — Z992 Dependence on renal dialysis: Secondary | ICD-10-CM | POA: Diagnosis not present

## 2018-06-22 DIAGNOSIS — N2581 Secondary hyperparathyroidism of renal origin: Secondary | ICD-10-CM | POA: Diagnosis not present

## 2018-06-22 DIAGNOSIS — Z992 Dependence on renal dialysis: Secondary | ICD-10-CM | POA: Diagnosis not present

## 2018-06-22 DIAGNOSIS — N186 End stage renal disease: Secondary | ICD-10-CM | POA: Diagnosis not present

## 2018-06-22 DIAGNOSIS — D509 Iron deficiency anemia, unspecified: Secondary | ICD-10-CM | POA: Diagnosis not present

## 2018-06-23 DIAGNOSIS — Z992 Dependence on renal dialysis: Secondary | ICD-10-CM | POA: Diagnosis not present

## 2018-06-23 DIAGNOSIS — N2581 Secondary hyperparathyroidism of renal origin: Secondary | ICD-10-CM | POA: Diagnosis not present

## 2018-06-23 DIAGNOSIS — D509 Iron deficiency anemia, unspecified: Secondary | ICD-10-CM | POA: Diagnosis not present

## 2018-06-23 DIAGNOSIS — N186 End stage renal disease: Secondary | ICD-10-CM | POA: Diagnosis not present

## 2018-06-24 DIAGNOSIS — Z992 Dependence on renal dialysis: Secondary | ICD-10-CM | POA: Diagnosis not present

## 2018-06-24 DIAGNOSIS — D509 Iron deficiency anemia, unspecified: Secondary | ICD-10-CM | POA: Diagnosis not present

## 2018-06-24 DIAGNOSIS — N2581 Secondary hyperparathyroidism of renal origin: Secondary | ICD-10-CM | POA: Diagnosis not present

## 2018-06-24 DIAGNOSIS — N183 Chronic kidney disease, stage 3 (moderate): Secondary | ICD-10-CM | POA: Diagnosis not present

## 2018-06-24 DIAGNOSIS — I119 Hypertensive heart disease without heart failure: Secondary | ICD-10-CM | POA: Diagnosis not present

## 2018-06-24 DIAGNOSIS — G894 Chronic pain syndrome: Secondary | ICD-10-CM | POA: Diagnosis not present

## 2018-06-24 DIAGNOSIS — N186 End stage renal disease: Secondary | ICD-10-CM | POA: Diagnosis not present

## 2018-06-25 DIAGNOSIS — D509 Iron deficiency anemia, unspecified: Secondary | ICD-10-CM | POA: Diagnosis not present

## 2018-06-25 DIAGNOSIS — N186 End stage renal disease: Secondary | ICD-10-CM | POA: Diagnosis not present

## 2018-06-25 DIAGNOSIS — Z992 Dependence on renal dialysis: Secondary | ICD-10-CM | POA: Diagnosis not present

## 2018-06-25 DIAGNOSIS — N2581 Secondary hyperparathyroidism of renal origin: Secondary | ICD-10-CM | POA: Diagnosis not present

## 2018-06-26 DIAGNOSIS — D509 Iron deficiency anemia, unspecified: Secondary | ICD-10-CM | POA: Diagnosis not present

## 2018-06-26 DIAGNOSIS — N2581 Secondary hyperparathyroidism of renal origin: Secondary | ICD-10-CM | POA: Diagnosis not present

## 2018-06-26 DIAGNOSIS — N186 End stage renal disease: Secondary | ICD-10-CM | POA: Diagnosis not present

## 2018-06-26 DIAGNOSIS — Z992 Dependence on renal dialysis: Secondary | ICD-10-CM | POA: Diagnosis not present

## 2018-06-28 DIAGNOSIS — D509 Iron deficiency anemia, unspecified: Secondary | ICD-10-CM | POA: Diagnosis not present

## 2018-06-28 DIAGNOSIS — N186 End stage renal disease: Secondary | ICD-10-CM | POA: Diagnosis not present

## 2018-06-28 DIAGNOSIS — N2581 Secondary hyperparathyroidism of renal origin: Secondary | ICD-10-CM | POA: Diagnosis not present

## 2018-06-28 DIAGNOSIS — Z992 Dependence on renal dialysis: Secondary | ICD-10-CM | POA: Diagnosis not present

## 2018-06-29 DIAGNOSIS — D509 Iron deficiency anemia, unspecified: Secondary | ICD-10-CM | POA: Diagnosis not present

## 2018-06-29 DIAGNOSIS — N2581 Secondary hyperparathyroidism of renal origin: Secondary | ICD-10-CM | POA: Diagnosis not present

## 2018-06-29 DIAGNOSIS — N186 End stage renal disease: Secondary | ICD-10-CM | POA: Diagnosis not present

## 2018-06-29 DIAGNOSIS — Z992 Dependence on renal dialysis: Secondary | ICD-10-CM | POA: Diagnosis not present

## 2018-06-30 DIAGNOSIS — N186 End stage renal disease: Secondary | ICD-10-CM | POA: Diagnosis not present

## 2018-06-30 DIAGNOSIS — D509 Iron deficiency anemia, unspecified: Secondary | ICD-10-CM | POA: Diagnosis not present

## 2018-06-30 DIAGNOSIS — N2581 Secondary hyperparathyroidism of renal origin: Secondary | ICD-10-CM | POA: Diagnosis not present

## 2018-06-30 DIAGNOSIS — Z992 Dependence on renal dialysis: Secondary | ICD-10-CM | POA: Diagnosis not present

## 2018-07-01 DIAGNOSIS — N186 End stage renal disease: Secondary | ICD-10-CM | POA: Diagnosis not present

## 2018-07-01 DIAGNOSIS — N2581 Secondary hyperparathyroidism of renal origin: Secondary | ICD-10-CM | POA: Diagnosis not present

## 2018-07-01 DIAGNOSIS — Z992 Dependence on renal dialysis: Secondary | ICD-10-CM | POA: Diagnosis not present

## 2018-07-01 DIAGNOSIS — D509 Iron deficiency anemia, unspecified: Secondary | ICD-10-CM | POA: Diagnosis not present

## 2018-07-02 DIAGNOSIS — N2581 Secondary hyperparathyroidism of renal origin: Secondary | ICD-10-CM | POA: Diagnosis not present

## 2018-07-02 DIAGNOSIS — Z992 Dependence on renal dialysis: Secondary | ICD-10-CM | POA: Diagnosis not present

## 2018-07-02 DIAGNOSIS — D509 Iron deficiency anemia, unspecified: Secondary | ICD-10-CM | POA: Diagnosis not present

## 2018-07-02 DIAGNOSIS — N186 End stage renal disease: Secondary | ICD-10-CM | POA: Diagnosis not present

## 2018-07-03 DIAGNOSIS — N186 End stage renal disease: Secondary | ICD-10-CM | POA: Diagnosis not present

## 2018-07-03 DIAGNOSIS — D509 Iron deficiency anemia, unspecified: Secondary | ICD-10-CM | POA: Diagnosis not present

## 2018-07-03 DIAGNOSIS — Z992 Dependence on renal dialysis: Secondary | ICD-10-CM | POA: Diagnosis not present

## 2018-07-03 DIAGNOSIS — N2581 Secondary hyperparathyroidism of renal origin: Secondary | ICD-10-CM | POA: Diagnosis not present

## 2018-07-05 DIAGNOSIS — N183 Chronic kidney disease, stage 3 (moderate): Secondary | ICD-10-CM | POA: Diagnosis not present

## 2018-07-05 DIAGNOSIS — Z992 Dependence on renal dialysis: Secondary | ICD-10-CM | POA: Diagnosis not present

## 2018-07-05 DIAGNOSIS — N186 End stage renal disease: Secondary | ICD-10-CM | POA: Diagnosis not present

## 2018-07-05 DIAGNOSIS — N2581 Secondary hyperparathyroidism of renal origin: Secondary | ICD-10-CM | POA: Diagnosis not present

## 2018-07-05 DIAGNOSIS — Z0000 Encounter for general adult medical examination without abnormal findings: Secondary | ICD-10-CM | POA: Diagnosis not present

## 2018-07-05 DIAGNOSIS — D509 Iron deficiency anemia, unspecified: Secondary | ICD-10-CM | POA: Diagnosis not present

## 2018-07-05 DIAGNOSIS — I119 Hypertensive heart disease without heart failure: Secondary | ICD-10-CM | POA: Diagnosis not present

## 2018-07-05 DIAGNOSIS — G894 Chronic pain syndrome: Secondary | ICD-10-CM | POA: Diagnosis not present

## 2018-07-06 DIAGNOSIS — D509 Iron deficiency anemia, unspecified: Secondary | ICD-10-CM | POA: Diagnosis not present

## 2018-07-06 DIAGNOSIS — Z992 Dependence on renal dialysis: Secondary | ICD-10-CM | POA: Diagnosis not present

## 2018-07-06 DIAGNOSIS — N186 End stage renal disease: Secondary | ICD-10-CM | POA: Diagnosis not present

## 2018-07-06 DIAGNOSIS — N2581 Secondary hyperparathyroidism of renal origin: Secondary | ICD-10-CM | POA: Diagnosis not present

## 2018-07-07 DIAGNOSIS — N2581 Secondary hyperparathyroidism of renal origin: Secondary | ICD-10-CM | POA: Diagnosis not present

## 2018-07-07 DIAGNOSIS — D509 Iron deficiency anemia, unspecified: Secondary | ICD-10-CM | POA: Diagnosis not present

## 2018-07-07 DIAGNOSIS — Z992 Dependence on renal dialysis: Secondary | ICD-10-CM | POA: Diagnosis not present

## 2018-07-07 DIAGNOSIS — N186 End stage renal disease: Secondary | ICD-10-CM | POA: Diagnosis not present

## 2018-07-08 ENCOUNTER — Other Ambulatory Visit: Payer: Self-pay | Admitting: Internal Medicine

## 2018-07-08 ENCOUNTER — Ambulatory Visit
Admission: RE | Admit: 2018-07-08 | Discharge: 2018-07-08 | Disposition: A | Discharge status: Home / Self Care | Payer: Medicare Other | Source: Ambulatory Visit | Attending: Internal Medicine | Admitting: Internal Medicine

## 2018-07-08 DIAGNOSIS — R0602 Shortness of breath: Secondary | ICD-10-CM

## 2018-07-08 DIAGNOSIS — I517 Cardiomegaly: Secondary | ICD-10-CM | POA: Diagnosis not present

## 2018-07-08 DIAGNOSIS — N2581 Secondary hyperparathyroidism of renal origin: Secondary | ICD-10-CM | POA: Diagnosis not present

## 2018-07-08 DIAGNOSIS — Z992 Dependence on renal dialysis: Secondary | ICD-10-CM | POA: Diagnosis not present

## 2018-07-08 DIAGNOSIS — R05 Cough: Secondary | ICD-10-CM | POA: Diagnosis not present

## 2018-07-08 DIAGNOSIS — D509 Iron deficiency anemia, unspecified: Secondary | ICD-10-CM | POA: Diagnosis not present

## 2018-07-08 DIAGNOSIS — N186 End stage renal disease: Secondary | ICD-10-CM | POA: Diagnosis not present

## 2018-07-09 DIAGNOSIS — N183 Chronic kidney disease, stage 3 (moderate): Secondary | ICD-10-CM | POA: Diagnosis not present

## 2018-07-09 DIAGNOSIS — F4310 Post-traumatic stress disorder, unspecified: Secondary | ICD-10-CM | POA: Diagnosis not present

## 2018-07-09 DIAGNOSIS — I10 Essential (primary) hypertension: Secondary | ICD-10-CM | POA: Diagnosis not present

## 2018-07-09 DIAGNOSIS — I255 Ischemic cardiomyopathy: Secondary | ICD-10-CM | POA: Diagnosis not present

## 2018-07-09 DIAGNOSIS — I480 Paroxysmal atrial fibrillation: Secondary | ICD-10-CM | POA: Diagnosis not present

## 2018-07-09 DIAGNOSIS — N186 End stage renal disease: Secondary | ICD-10-CM | POA: Diagnosis not present

## 2018-07-09 DIAGNOSIS — D509 Iron deficiency anemia, unspecified: Secondary | ICD-10-CM | POA: Diagnosis not present

## 2018-07-09 DIAGNOSIS — I5022 Chronic systolic (congestive) heart failure: Secondary | ICD-10-CM | POA: Diagnosis not present

## 2018-07-09 DIAGNOSIS — I493 Ventricular premature depolarization: Secondary | ICD-10-CM | POA: Diagnosis not present

## 2018-07-09 DIAGNOSIS — Z992 Dependence on renal dialysis: Secondary | ICD-10-CM | POA: Diagnosis not present

## 2018-07-09 DIAGNOSIS — E1122 Type 2 diabetes mellitus with diabetic chronic kidney disease: Secondary | ICD-10-CM | POA: Diagnosis not present

## 2018-07-09 DIAGNOSIS — N2581 Secondary hyperparathyroidism of renal origin: Secondary | ICD-10-CM | POA: Diagnosis not present

## 2018-07-09 DIAGNOSIS — I2510 Atherosclerotic heart disease of native coronary artery without angina pectoris: Secondary | ICD-10-CM | POA: Diagnosis not present

## 2018-07-09 DIAGNOSIS — I429 Cardiomyopathy, unspecified: Secondary | ICD-10-CM | POA: Diagnosis not present

## 2018-07-09 DIAGNOSIS — Z955 Presence of coronary angioplasty implant and graft: Secondary | ICD-10-CM | POA: Diagnosis not present

## 2018-07-09 DIAGNOSIS — R0602 Shortness of breath: Secondary | ICD-10-CM | POA: Diagnosis not present

## 2018-07-10 DIAGNOSIS — N2581 Secondary hyperparathyroidism of renal origin: Secondary | ICD-10-CM | POA: Diagnosis not present

## 2018-07-10 DIAGNOSIS — Z992 Dependence on renal dialysis: Secondary | ICD-10-CM | POA: Diagnosis not present

## 2018-07-10 DIAGNOSIS — N186 End stage renal disease: Secondary | ICD-10-CM | POA: Diagnosis not present

## 2018-07-10 DIAGNOSIS — D509 Iron deficiency anemia, unspecified: Secondary | ICD-10-CM | POA: Diagnosis not present

## 2018-07-12 DIAGNOSIS — N2581 Secondary hyperparathyroidism of renal origin: Secondary | ICD-10-CM | POA: Diagnosis not present

## 2018-07-12 DIAGNOSIS — Z992 Dependence on renal dialysis: Secondary | ICD-10-CM | POA: Diagnosis not present

## 2018-07-12 DIAGNOSIS — D509 Iron deficiency anemia, unspecified: Secondary | ICD-10-CM | POA: Diagnosis not present

## 2018-07-12 DIAGNOSIS — N186 End stage renal disease: Secondary | ICD-10-CM | POA: Diagnosis not present

## 2018-07-13 DIAGNOSIS — N2581 Secondary hyperparathyroidism of renal origin: Secondary | ICD-10-CM | POA: Diagnosis not present

## 2018-07-13 DIAGNOSIS — Z992 Dependence on renal dialysis: Secondary | ICD-10-CM | POA: Diagnosis not present

## 2018-07-13 DIAGNOSIS — D509 Iron deficiency anemia, unspecified: Secondary | ICD-10-CM | POA: Diagnosis not present

## 2018-07-13 DIAGNOSIS — N186 End stage renal disease: Secondary | ICD-10-CM | POA: Diagnosis not present

## 2018-07-14 DIAGNOSIS — N186 End stage renal disease: Secondary | ICD-10-CM | POA: Diagnosis not present

## 2018-07-14 DIAGNOSIS — D509 Iron deficiency anemia, unspecified: Secondary | ICD-10-CM | POA: Diagnosis not present

## 2018-07-14 DIAGNOSIS — N2581 Secondary hyperparathyroidism of renal origin: Secondary | ICD-10-CM | POA: Diagnosis not present

## 2018-07-14 DIAGNOSIS — Z992 Dependence on renal dialysis: Secondary | ICD-10-CM | POA: Diagnosis not present

## 2018-07-15 DIAGNOSIS — D509 Iron deficiency anemia, unspecified: Secondary | ICD-10-CM | POA: Diagnosis not present

## 2018-07-15 DIAGNOSIS — N2581 Secondary hyperparathyroidism of renal origin: Secondary | ICD-10-CM | POA: Diagnosis not present

## 2018-07-15 DIAGNOSIS — N186 End stage renal disease: Secondary | ICD-10-CM | POA: Diagnosis not present

## 2018-07-15 DIAGNOSIS — Z992 Dependence on renal dialysis: Secondary | ICD-10-CM | POA: Diagnosis not present

## 2018-07-16 DIAGNOSIS — N2581 Secondary hyperparathyroidism of renal origin: Secondary | ICD-10-CM | POA: Diagnosis not present

## 2018-07-16 DIAGNOSIS — Z992 Dependence on renal dialysis: Secondary | ICD-10-CM | POA: Diagnosis not present

## 2018-07-16 DIAGNOSIS — N186 End stage renal disease: Secondary | ICD-10-CM | POA: Diagnosis not present

## 2018-07-16 DIAGNOSIS — D509 Iron deficiency anemia, unspecified: Secondary | ICD-10-CM | POA: Diagnosis not present

## 2018-07-17 DIAGNOSIS — N2581 Secondary hyperparathyroidism of renal origin: Secondary | ICD-10-CM | POA: Diagnosis not present

## 2018-07-17 DIAGNOSIS — Z992 Dependence on renal dialysis: Secondary | ICD-10-CM | POA: Diagnosis not present

## 2018-07-17 DIAGNOSIS — D509 Iron deficiency anemia, unspecified: Secondary | ICD-10-CM | POA: Diagnosis not present

## 2018-07-17 DIAGNOSIS — N186 End stage renal disease: Secondary | ICD-10-CM | POA: Diagnosis not present

## 2018-07-19 DIAGNOSIS — N186 End stage renal disease: Secondary | ICD-10-CM | POA: Diagnosis not present

## 2018-07-19 DIAGNOSIS — Z992 Dependence on renal dialysis: Secondary | ICD-10-CM | POA: Diagnosis not present

## 2018-07-19 DIAGNOSIS — N2581 Secondary hyperparathyroidism of renal origin: Secondary | ICD-10-CM | POA: Diagnosis not present

## 2018-07-19 DIAGNOSIS — D509 Iron deficiency anemia, unspecified: Secondary | ICD-10-CM | POA: Diagnosis not present

## 2018-07-20 DIAGNOSIS — Z992 Dependence on renal dialysis: Secondary | ICD-10-CM | POA: Diagnosis not present

## 2018-07-20 DIAGNOSIS — N186 End stage renal disease: Secondary | ICD-10-CM | POA: Diagnosis not present

## 2018-07-20 DIAGNOSIS — N2581 Secondary hyperparathyroidism of renal origin: Secondary | ICD-10-CM | POA: Diagnosis not present

## 2018-07-20 DIAGNOSIS — D509 Iron deficiency anemia, unspecified: Secondary | ICD-10-CM | POA: Diagnosis not present

## 2018-07-21 DIAGNOSIS — N2581 Secondary hyperparathyroidism of renal origin: Secondary | ICD-10-CM | POA: Diagnosis not present

## 2018-07-21 DIAGNOSIS — D509 Iron deficiency anemia, unspecified: Secondary | ICD-10-CM | POA: Diagnosis not present

## 2018-07-21 DIAGNOSIS — Z992 Dependence on renal dialysis: Secondary | ICD-10-CM | POA: Diagnosis not present

## 2018-07-21 DIAGNOSIS — N186 End stage renal disease: Secondary | ICD-10-CM | POA: Diagnosis not present

## 2018-07-22 DIAGNOSIS — N186 End stage renal disease: Secondary | ICD-10-CM | POA: Diagnosis not present

## 2018-07-22 DIAGNOSIS — D509 Iron deficiency anemia, unspecified: Secondary | ICD-10-CM | POA: Diagnosis not present

## 2018-07-22 DIAGNOSIS — Z992 Dependence on renal dialysis: Secondary | ICD-10-CM | POA: Diagnosis not present

## 2018-07-22 DIAGNOSIS — N2581 Secondary hyperparathyroidism of renal origin: Secondary | ICD-10-CM | POA: Diagnosis not present

## 2018-07-23 DIAGNOSIS — D509 Iron deficiency anemia, unspecified: Secondary | ICD-10-CM | POA: Diagnosis not present

## 2018-07-23 DIAGNOSIS — Z992 Dependence on renal dialysis: Secondary | ICD-10-CM | POA: Diagnosis not present

## 2018-07-23 DIAGNOSIS — N186 End stage renal disease: Secondary | ICD-10-CM | POA: Diagnosis not present

## 2018-07-23 DIAGNOSIS — N2581 Secondary hyperparathyroidism of renal origin: Secondary | ICD-10-CM | POA: Diagnosis not present

## 2018-07-24 DIAGNOSIS — N186 End stage renal disease: Secondary | ICD-10-CM | POA: Diagnosis not present

## 2018-07-24 DIAGNOSIS — D509 Iron deficiency anemia, unspecified: Secondary | ICD-10-CM | POA: Diagnosis not present

## 2018-07-24 DIAGNOSIS — N2581 Secondary hyperparathyroidism of renal origin: Secondary | ICD-10-CM | POA: Diagnosis not present

## 2018-07-24 DIAGNOSIS — Z992 Dependence on renal dialysis: Secondary | ICD-10-CM | POA: Diagnosis not present

## 2018-07-26 DIAGNOSIS — D509 Iron deficiency anemia, unspecified: Secondary | ICD-10-CM | POA: Diagnosis not present

## 2018-07-26 DIAGNOSIS — N186 End stage renal disease: Secondary | ICD-10-CM | POA: Diagnosis not present

## 2018-07-26 DIAGNOSIS — Z992 Dependence on renal dialysis: Secondary | ICD-10-CM | POA: Diagnosis not present

## 2018-07-26 DIAGNOSIS — N2581 Secondary hyperparathyroidism of renal origin: Secondary | ICD-10-CM | POA: Diagnosis not present

## 2018-07-26 DIAGNOSIS — E119 Type 2 diabetes mellitus without complications: Secondary | ICD-10-CM | POA: Diagnosis not present

## 2018-07-27 DIAGNOSIS — Z992 Dependence on renal dialysis: Secondary | ICD-10-CM | POA: Diagnosis not present

## 2018-07-27 DIAGNOSIS — D509 Iron deficiency anemia, unspecified: Secondary | ICD-10-CM | POA: Diagnosis not present

## 2018-07-27 DIAGNOSIS — N2581 Secondary hyperparathyroidism of renal origin: Secondary | ICD-10-CM | POA: Diagnosis not present

## 2018-07-27 DIAGNOSIS — N186 End stage renal disease: Secondary | ICD-10-CM | POA: Diagnosis not present

## 2018-07-28 DIAGNOSIS — Z992 Dependence on renal dialysis: Secondary | ICD-10-CM | POA: Diagnosis not present

## 2018-07-28 DIAGNOSIS — N2581 Secondary hyperparathyroidism of renal origin: Secondary | ICD-10-CM | POA: Diagnosis not present

## 2018-07-28 DIAGNOSIS — N186 End stage renal disease: Secondary | ICD-10-CM | POA: Diagnosis not present

## 2018-07-28 DIAGNOSIS — D509 Iron deficiency anemia, unspecified: Secondary | ICD-10-CM | POA: Diagnosis not present

## 2018-07-29 DIAGNOSIS — N186 End stage renal disease: Secondary | ICD-10-CM | POA: Diagnosis not present

## 2018-07-29 DIAGNOSIS — N2581 Secondary hyperparathyroidism of renal origin: Secondary | ICD-10-CM | POA: Diagnosis not present

## 2018-07-29 DIAGNOSIS — D509 Iron deficiency anemia, unspecified: Secondary | ICD-10-CM | POA: Diagnosis not present

## 2018-07-29 DIAGNOSIS — Z992 Dependence on renal dialysis: Secondary | ICD-10-CM | POA: Diagnosis not present

## 2018-07-30 DIAGNOSIS — Z992 Dependence on renal dialysis: Secondary | ICD-10-CM | POA: Diagnosis not present

## 2018-07-30 DIAGNOSIS — N186 End stage renal disease: Secondary | ICD-10-CM | POA: Diagnosis not present

## 2018-07-30 DIAGNOSIS — N2581 Secondary hyperparathyroidism of renal origin: Secondary | ICD-10-CM | POA: Diagnosis not present

## 2018-07-30 DIAGNOSIS — D509 Iron deficiency anemia, unspecified: Secondary | ICD-10-CM | POA: Diagnosis not present

## 2018-07-31 DIAGNOSIS — D509 Iron deficiency anemia, unspecified: Secondary | ICD-10-CM | POA: Diagnosis not present

## 2018-07-31 DIAGNOSIS — N2581 Secondary hyperparathyroidism of renal origin: Secondary | ICD-10-CM | POA: Diagnosis not present

## 2018-07-31 DIAGNOSIS — N186 End stage renal disease: Secondary | ICD-10-CM | POA: Diagnosis not present

## 2018-07-31 DIAGNOSIS — Z992 Dependence on renal dialysis: Secondary | ICD-10-CM | POA: Diagnosis not present

## 2018-08-02 DIAGNOSIS — Z992 Dependence on renal dialysis: Secondary | ICD-10-CM | POA: Diagnosis not present

## 2018-08-02 DIAGNOSIS — N2581 Secondary hyperparathyroidism of renal origin: Secondary | ICD-10-CM | POA: Diagnosis not present

## 2018-08-02 DIAGNOSIS — D509 Iron deficiency anemia, unspecified: Secondary | ICD-10-CM | POA: Diagnosis not present

## 2018-08-02 DIAGNOSIS — N186 End stage renal disease: Secondary | ICD-10-CM | POA: Diagnosis not present

## 2018-08-03 DIAGNOSIS — N2581 Secondary hyperparathyroidism of renal origin: Secondary | ICD-10-CM | POA: Diagnosis not present

## 2018-08-03 DIAGNOSIS — Z992 Dependence on renal dialysis: Secondary | ICD-10-CM | POA: Diagnosis not present

## 2018-08-03 DIAGNOSIS — N186 End stage renal disease: Secondary | ICD-10-CM | POA: Diagnosis not present

## 2018-08-03 DIAGNOSIS — D509 Iron deficiency anemia, unspecified: Secondary | ICD-10-CM | POA: Diagnosis not present

## 2018-08-04 DIAGNOSIS — N186 End stage renal disease: Secondary | ICD-10-CM | POA: Diagnosis not present

## 2018-08-04 DIAGNOSIS — D509 Iron deficiency anemia, unspecified: Secondary | ICD-10-CM | POA: Diagnosis not present

## 2018-08-04 DIAGNOSIS — N2581 Secondary hyperparathyroidism of renal origin: Secondary | ICD-10-CM | POA: Diagnosis not present

## 2018-08-04 DIAGNOSIS — Z992 Dependence on renal dialysis: Secondary | ICD-10-CM | POA: Diagnosis not present

## 2018-08-05 DIAGNOSIS — Z992 Dependence on renal dialysis: Secondary | ICD-10-CM | POA: Diagnosis not present

## 2018-08-05 DIAGNOSIS — N186 End stage renal disease: Secondary | ICD-10-CM | POA: Diagnosis not present

## 2018-08-05 DIAGNOSIS — N2581 Secondary hyperparathyroidism of renal origin: Secondary | ICD-10-CM | POA: Diagnosis not present

## 2018-08-05 DIAGNOSIS — D509 Iron deficiency anemia, unspecified: Secondary | ICD-10-CM | POA: Diagnosis not present

## 2018-08-06 DIAGNOSIS — Z992 Dependence on renal dialysis: Secondary | ICD-10-CM | POA: Diagnosis not present

## 2018-08-06 DIAGNOSIS — D509 Iron deficiency anemia, unspecified: Secondary | ICD-10-CM | POA: Diagnosis not present

## 2018-08-06 DIAGNOSIS — N2581 Secondary hyperparathyroidism of renal origin: Secondary | ICD-10-CM | POA: Diagnosis not present

## 2018-08-06 DIAGNOSIS — N186 End stage renal disease: Secondary | ICD-10-CM | POA: Diagnosis not present

## 2018-08-07 DIAGNOSIS — N2581 Secondary hyperparathyroidism of renal origin: Secondary | ICD-10-CM | POA: Diagnosis not present

## 2018-08-07 DIAGNOSIS — Z992 Dependence on renal dialysis: Secondary | ICD-10-CM | POA: Diagnosis not present

## 2018-08-07 DIAGNOSIS — N186 End stage renal disease: Secondary | ICD-10-CM | POA: Diagnosis not present

## 2018-08-07 DIAGNOSIS — D509 Iron deficiency anemia, unspecified: Secondary | ICD-10-CM | POA: Diagnosis not present

## 2018-08-09 DIAGNOSIS — Z992 Dependence on renal dialysis: Secondary | ICD-10-CM | POA: Diagnosis not present

## 2018-08-09 DIAGNOSIS — N186 End stage renal disease: Secondary | ICD-10-CM | POA: Diagnosis not present

## 2018-08-09 DIAGNOSIS — D509 Iron deficiency anemia, unspecified: Secondary | ICD-10-CM | POA: Diagnosis not present

## 2018-08-09 DIAGNOSIS — N2581 Secondary hyperparathyroidism of renal origin: Secondary | ICD-10-CM | POA: Diagnosis not present

## 2018-08-10 DIAGNOSIS — N186 End stage renal disease: Secondary | ICD-10-CM | POA: Diagnosis not present

## 2018-08-10 DIAGNOSIS — Z992 Dependence on renal dialysis: Secondary | ICD-10-CM | POA: Diagnosis not present

## 2018-08-10 DIAGNOSIS — D509 Iron deficiency anemia, unspecified: Secondary | ICD-10-CM | POA: Diagnosis not present

## 2018-08-10 DIAGNOSIS — N2581 Secondary hyperparathyroidism of renal origin: Secondary | ICD-10-CM | POA: Diagnosis not present

## 2018-08-11 DIAGNOSIS — N2581 Secondary hyperparathyroidism of renal origin: Secondary | ICD-10-CM | POA: Diagnosis not present

## 2018-08-11 DIAGNOSIS — D509 Iron deficiency anemia, unspecified: Secondary | ICD-10-CM | POA: Diagnosis not present

## 2018-08-11 DIAGNOSIS — Z992 Dependence on renal dialysis: Secondary | ICD-10-CM | POA: Diagnosis not present

## 2018-08-11 DIAGNOSIS — N186 End stage renal disease: Secondary | ICD-10-CM | POA: Diagnosis not present

## 2018-08-12 DIAGNOSIS — Z992 Dependence on renal dialysis: Secondary | ICD-10-CM | POA: Diagnosis not present

## 2018-08-12 DIAGNOSIS — N186 End stage renal disease: Secondary | ICD-10-CM | POA: Diagnosis not present

## 2018-08-12 DIAGNOSIS — D509 Iron deficiency anemia, unspecified: Secondary | ICD-10-CM | POA: Diagnosis not present

## 2018-08-12 DIAGNOSIS — N2581 Secondary hyperparathyroidism of renal origin: Secondary | ICD-10-CM | POA: Diagnosis not present

## 2018-08-13 DIAGNOSIS — N186 End stage renal disease: Secondary | ICD-10-CM | POA: Diagnosis not present

## 2018-08-13 DIAGNOSIS — D509 Iron deficiency anemia, unspecified: Secondary | ICD-10-CM | POA: Diagnosis not present

## 2018-08-13 DIAGNOSIS — Z992 Dependence on renal dialysis: Secondary | ICD-10-CM | POA: Diagnosis not present

## 2018-08-13 DIAGNOSIS — N2581 Secondary hyperparathyroidism of renal origin: Secondary | ICD-10-CM | POA: Diagnosis not present

## 2018-08-14 DIAGNOSIS — N186 End stage renal disease: Secondary | ICD-10-CM | POA: Diagnosis not present

## 2018-08-14 DIAGNOSIS — D509 Iron deficiency anemia, unspecified: Secondary | ICD-10-CM | POA: Diagnosis not present

## 2018-08-14 DIAGNOSIS — Z992 Dependence on renal dialysis: Secondary | ICD-10-CM | POA: Diagnosis not present

## 2018-08-14 DIAGNOSIS — N2581 Secondary hyperparathyroidism of renal origin: Secondary | ICD-10-CM | POA: Diagnosis not present

## 2018-08-16 DIAGNOSIS — N2581 Secondary hyperparathyroidism of renal origin: Secondary | ICD-10-CM | POA: Diagnosis not present

## 2018-08-16 DIAGNOSIS — D509 Iron deficiency anemia, unspecified: Secondary | ICD-10-CM | POA: Diagnosis not present

## 2018-08-16 DIAGNOSIS — N186 End stage renal disease: Secondary | ICD-10-CM | POA: Diagnosis not present

## 2018-08-16 DIAGNOSIS — Z992 Dependence on renal dialysis: Secondary | ICD-10-CM | POA: Diagnosis not present

## 2018-08-17 DIAGNOSIS — N186 End stage renal disease: Secondary | ICD-10-CM | POA: Diagnosis not present

## 2018-08-17 DIAGNOSIS — Z992 Dependence on renal dialysis: Secondary | ICD-10-CM | POA: Diagnosis not present

## 2018-08-17 DIAGNOSIS — D509 Iron deficiency anemia, unspecified: Secondary | ICD-10-CM | POA: Diagnosis not present

## 2018-08-17 DIAGNOSIS — N2581 Secondary hyperparathyroidism of renal origin: Secondary | ICD-10-CM | POA: Diagnosis not present

## 2018-08-18 DIAGNOSIS — N186 End stage renal disease: Secondary | ICD-10-CM | POA: Diagnosis not present

## 2018-08-18 DIAGNOSIS — D509 Iron deficiency anemia, unspecified: Secondary | ICD-10-CM | POA: Diagnosis not present

## 2018-08-18 DIAGNOSIS — Z992 Dependence on renal dialysis: Secondary | ICD-10-CM | POA: Diagnosis not present

## 2018-08-18 DIAGNOSIS — N2581 Secondary hyperparathyroidism of renal origin: Secondary | ICD-10-CM | POA: Diagnosis not present

## 2018-08-19 DIAGNOSIS — D509 Iron deficiency anemia, unspecified: Secondary | ICD-10-CM | POA: Diagnosis not present

## 2018-08-19 DIAGNOSIS — N186 End stage renal disease: Secondary | ICD-10-CM | POA: Diagnosis not present

## 2018-08-19 DIAGNOSIS — N2581 Secondary hyperparathyroidism of renal origin: Secondary | ICD-10-CM | POA: Diagnosis not present

## 2018-08-19 DIAGNOSIS — Z992 Dependence on renal dialysis: Secondary | ICD-10-CM | POA: Diagnosis not present

## 2018-08-20 DIAGNOSIS — N2581 Secondary hyperparathyroidism of renal origin: Secondary | ICD-10-CM | POA: Diagnosis not present

## 2018-08-20 DIAGNOSIS — N186 End stage renal disease: Secondary | ICD-10-CM | POA: Diagnosis not present

## 2018-08-20 DIAGNOSIS — Z992 Dependence on renal dialysis: Secondary | ICD-10-CM | POA: Diagnosis not present

## 2018-08-20 DIAGNOSIS — D509 Iron deficiency anemia, unspecified: Secondary | ICD-10-CM | POA: Diagnosis not present

## 2018-08-21 DIAGNOSIS — N2581 Secondary hyperparathyroidism of renal origin: Secondary | ICD-10-CM | POA: Diagnosis not present

## 2018-08-21 DIAGNOSIS — N186 End stage renal disease: Secondary | ICD-10-CM | POA: Diagnosis not present

## 2018-08-21 DIAGNOSIS — Z992 Dependence on renal dialysis: Secondary | ICD-10-CM | POA: Diagnosis not present

## 2018-08-21 DIAGNOSIS — D509 Iron deficiency anemia, unspecified: Secondary | ICD-10-CM | POA: Diagnosis not present

## 2018-08-23 DIAGNOSIS — N186 End stage renal disease: Secondary | ICD-10-CM | POA: Diagnosis not present

## 2018-08-23 DIAGNOSIS — N2581 Secondary hyperparathyroidism of renal origin: Secondary | ICD-10-CM | POA: Diagnosis not present

## 2018-08-23 DIAGNOSIS — Z992 Dependence on renal dialysis: Secondary | ICD-10-CM | POA: Diagnosis not present

## 2018-08-23 DIAGNOSIS — D509 Iron deficiency anemia, unspecified: Secondary | ICD-10-CM | POA: Diagnosis not present

## 2018-08-24 DIAGNOSIS — N2581 Secondary hyperparathyroidism of renal origin: Secondary | ICD-10-CM | POA: Diagnosis not present

## 2018-08-24 DIAGNOSIS — D509 Iron deficiency anemia, unspecified: Secondary | ICD-10-CM | POA: Diagnosis not present

## 2018-08-24 DIAGNOSIS — Z992 Dependence on renal dialysis: Secondary | ICD-10-CM | POA: Diagnosis not present

## 2018-08-24 DIAGNOSIS — N186 End stage renal disease: Secondary | ICD-10-CM | POA: Diagnosis not present

## 2018-08-25 DIAGNOSIS — N186 End stage renal disease: Secondary | ICD-10-CM | POA: Diagnosis not present

## 2018-08-25 DIAGNOSIS — Z992 Dependence on renal dialysis: Secondary | ICD-10-CM | POA: Diagnosis not present

## 2018-08-25 DIAGNOSIS — N2581 Secondary hyperparathyroidism of renal origin: Secondary | ICD-10-CM | POA: Diagnosis not present

## 2018-08-25 DIAGNOSIS — D509 Iron deficiency anemia, unspecified: Secondary | ICD-10-CM | POA: Diagnosis not present

## 2018-08-26 DIAGNOSIS — N186 End stage renal disease: Secondary | ICD-10-CM | POA: Diagnosis not present

## 2018-08-26 DIAGNOSIS — Z992 Dependence on renal dialysis: Secondary | ICD-10-CM | POA: Diagnosis not present

## 2018-08-26 DIAGNOSIS — D509 Iron deficiency anemia, unspecified: Secondary | ICD-10-CM | POA: Diagnosis not present

## 2018-08-26 DIAGNOSIS — N2581 Secondary hyperparathyroidism of renal origin: Secondary | ICD-10-CM | POA: Diagnosis not present

## 2018-08-27 DIAGNOSIS — D509 Iron deficiency anemia, unspecified: Secondary | ICD-10-CM | POA: Diagnosis not present

## 2018-08-27 DIAGNOSIS — Z992 Dependence on renal dialysis: Secondary | ICD-10-CM | POA: Diagnosis not present

## 2018-08-27 DIAGNOSIS — N186 End stage renal disease: Secondary | ICD-10-CM | POA: Diagnosis not present

## 2018-08-27 DIAGNOSIS — N2581 Secondary hyperparathyroidism of renal origin: Secondary | ICD-10-CM | POA: Diagnosis not present

## 2018-08-28 DIAGNOSIS — D509 Iron deficiency anemia, unspecified: Secondary | ICD-10-CM | POA: Diagnosis not present

## 2018-08-28 DIAGNOSIS — N186 End stage renal disease: Secondary | ICD-10-CM | POA: Diagnosis not present

## 2018-08-28 DIAGNOSIS — N2581 Secondary hyperparathyroidism of renal origin: Secondary | ICD-10-CM | POA: Diagnosis not present

## 2018-08-28 DIAGNOSIS — Z992 Dependence on renal dialysis: Secondary | ICD-10-CM | POA: Diagnosis not present

## 2018-08-30 DIAGNOSIS — N186 End stage renal disease: Secondary | ICD-10-CM | POA: Diagnosis not present

## 2018-08-30 DIAGNOSIS — Z992 Dependence on renal dialysis: Secondary | ICD-10-CM | POA: Diagnosis not present

## 2018-08-30 DIAGNOSIS — N2581 Secondary hyperparathyroidism of renal origin: Secondary | ICD-10-CM | POA: Diagnosis not present

## 2018-08-30 DIAGNOSIS — D509 Iron deficiency anemia, unspecified: Secondary | ICD-10-CM | POA: Diagnosis not present

## 2018-08-31 DIAGNOSIS — N2581 Secondary hyperparathyroidism of renal origin: Secondary | ICD-10-CM | POA: Diagnosis not present

## 2018-08-31 DIAGNOSIS — Z992 Dependence on renal dialysis: Secondary | ICD-10-CM | POA: Diagnosis not present

## 2018-08-31 DIAGNOSIS — D509 Iron deficiency anemia, unspecified: Secondary | ICD-10-CM | POA: Diagnosis not present

## 2018-08-31 DIAGNOSIS — N186 End stage renal disease: Secondary | ICD-10-CM | POA: Diagnosis not present

## 2018-09-01 DIAGNOSIS — Z992 Dependence on renal dialysis: Secondary | ICD-10-CM | POA: Diagnosis not present

## 2018-09-01 DIAGNOSIS — I4891 Unspecified atrial fibrillation: Secondary | ICD-10-CM | POA: Diagnosis not present

## 2018-09-01 DIAGNOSIS — N2581 Secondary hyperparathyroidism of renal origin: Secondary | ICD-10-CM | POA: Diagnosis not present

## 2018-09-01 DIAGNOSIS — I10 Essential (primary) hypertension: Secondary | ICD-10-CM | POA: Diagnosis not present

## 2018-09-01 DIAGNOSIS — D509 Iron deficiency anemia, unspecified: Secondary | ICD-10-CM | POA: Diagnosis not present

## 2018-09-01 DIAGNOSIS — N186 End stage renal disease: Secondary | ICD-10-CM | POA: Diagnosis not present

## 2018-09-02 DIAGNOSIS — N186 End stage renal disease: Secondary | ICD-10-CM | POA: Diagnosis not present

## 2018-09-02 DIAGNOSIS — Z992 Dependence on renal dialysis: Secondary | ICD-10-CM | POA: Diagnosis not present

## 2018-09-02 DIAGNOSIS — D509 Iron deficiency anemia, unspecified: Secondary | ICD-10-CM | POA: Diagnosis not present

## 2018-09-02 DIAGNOSIS — N2581 Secondary hyperparathyroidism of renal origin: Secondary | ICD-10-CM | POA: Diagnosis not present

## 2018-09-03 DIAGNOSIS — Z992 Dependence on renal dialysis: Secondary | ICD-10-CM | POA: Diagnosis not present

## 2018-09-03 DIAGNOSIS — D509 Iron deficiency anemia, unspecified: Secondary | ICD-10-CM | POA: Diagnosis not present

## 2018-09-03 DIAGNOSIS — N2581 Secondary hyperparathyroidism of renal origin: Secondary | ICD-10-CM | POA: Diagnosis not present

## 2018-09-03 DIAGNOSIS — N186 End stage renal disease: Secondary | ICD-10-CM | POA: Diagnosis not present

## 2018-09-04 DIAGNOSIS — N2581 Secondary hyperparathyroidism of renal origin: Secondary | ICD-10-CM | POA: Diagnosis not present

## 2018-09-04 DIAGNOSIS — N186 End stage renal disease: Secondary | ICD-10-CM | POA: Diagnosis not present

## 2018-09-04 DIAGNOSIS — D509 Iron deficiency anemia, unspecified: Secondary | ICD-10-CM | POA: Diagnosis not present

## 2018-09-04 DIAGNOSIS — Z992 Dependence on renal dialysis: Secondary | ICD-10-CM | POA: Diagnosis not present

## 2018-09-06 DIAGNOSIS — N2581 Secondary hyperparathyroidism of renal origin: Secondary | ICD-10-CM | POA: Diagnosis not present

## 2018-09-06 DIAGNOSIS — N186 End stage renal disease: Secondary | ICD-10-CM | POA: Diagnosis not present

## 2018-09-06 DIAGNOSIS — Z992 Dependence on renal dialysis: Secondary | ICD-10-CM | POA: Diagnosis not present

## 2018-09-06 DIAGNOSIS — D509 Iron deficiency anemia, unspecified: Secondary | ICD-10-CM | POA: Diagnosis not present

## 2018-09-07 DIAGNOSIS — N2581 Secondary hyperparathyroidism of renal origin: Secondary | ICD-10-CM | POA: Diagnosis not present

## 2018-09-07 DIAGNOSIS — D509 Iron deficiency anemia, unspecified: Secondary | ICD-10-CM | POA: Diagnosis not present

## 2018-09-07 DIAGNOSIS — N186 End stage renal disease: Secondary | ICD-10-CM | POA: Diagnosis not present

## 2018-09-07 DIAGNOSIS — Z992 Dependence on renal dialysis: Secondary | ICD-10-CM | POA: Diagnosis not present

## 2018-09-08 DIAGNOSIS — D509 Iron deficiency anemia, unspecified: Secondary | ICD-10-CM | POA: Diagnosis not present

## 2018-09-08 DIAGNOSIS — Z992 Dependence on renal dialysis: Secondary | ICD-10-CM | POA: Diagnosis not present

## 2018-09-08 DIAGNOSIS — N186 End stage renal disease: Secondary | ICD-10-CM | POA: Diagnosis not present

## 2018-09-08 DIAGNOSIS — N2581 Secondary hyperparathyroidism of renal origin: Secondary | ICD-10-CM | POA: Diagnosis not present

## 2018-09-09 DIAGNOSIS — Z992 Dependence on renal dialysis: Secondary | ICD-10-CM | POA: Diagnosis not present

## 2018-09-09 DIAGNOSIS — D509 Iron deficiency anemia, unspecified: Secondary | ICD-10-CM | POA: Diagnosis not present

## 2018-09-09 DIAGNOSIS — N2581 Secondary hyperparathyroidism of renal origin: Secondary | ICD-10-CM | POA: Diagnosis not present

## 2018-09-09 DIAGNOSIS — N186 End stage renal disease: Secondary | ICD-10-CM | POA: Diagnosis not present

## 2018-09-10 DIAGNOSIS — Z992 Dependence on renal dialysis: Secondary | ICD-10-CM | POA: Diagnosis not present

## 2018-09-10 DIAGNOSIS — N186 End stage renal disease: Secondary | ICD-10-CM | POA: Diagnosis not present

## 2018-09-10 DIAGNOSIS — D509 Iron deficiency anemia, unspecified: Secondary | ICD-10-CM | POA: Diagnosis not present

## 2018-09-10 DIAGNOSIS — N2581 Secondary hyperparathyroidism of renal origin: Secondary | ICD-10-CM | POA: Diagnosis not present

## 2018-09-11 DIAGNOSIS — N2581 Secondary hyperparathyroidism of renal origin: Secondary | ICD-10-CM | POA: Diagnosis not present

## 2018-09-11 DIAGNOSIS — Z992 Dependence on renal dialysis: Secondary | ICD-10-CM | POA: Diagnosis not present

## 2018-09-11 DIAGNOSIS — N186 End stage renal disease: Secondary | ICD-10-CM | POA: Diagnosis not present

## 2018-09-11 DIAGNOSIS — D509 Iron deficiency anemia, unspecified: Secondary | ICD-10-CM | POA: Diagnosis not present

## 2018-09-13 DIAGNOSIS — D509 Iron deficiency anemia, unspecified: Secondary | ICD-10-CM | POA: Diagnosis not present

## 2018-09-13 DIAGNOSIS — N186 End stage renal disease: Secondary | ICD-10-CM | POA: Diagnosis not present

## 2018-09-13 DIAGNOSIS — Z992 Dependence on renal dialysis: Secondary | ICD-10-CM | POA: Diagnosis not present

## 2018-09-13 DIAGNOSIS — N2581 Secondary hyperparathyroidism of renal origin: Secondary | ICD-10-CM | POA: Diagnosis not present

## 2018-09-14 DIAGNOSIS — Z992 Dependence on renal dialysis: Secondary | ICD-10-CM | POA: Diagnosis not present

## 2018-09-14 DIAGNOSIS — N186 End stage renal disease: Secondary | ICD-10-CM | POA: Diagnosis not present

## 2018-09-14 DIAGNOSIS — D509 Iron deficiency anemia, unspecified: Secondary | ICD-10-CM | POA: Diagnosis not present

## 2018-09-14 DIAGNOSIS — N2581 Secondary hyperparathyroidism of renal origin: Secondary | ICD-10-CM | POA: Diagnosis not present

## 2018-09-15 DIAGNOSIS — Z992 Dependence on renal dialysis: Secondary | ICD-10-CM | POA: Diagnosis not present

## 2018-09-15 DIAGNOSIS — N186 End stage renal disease: Secondary | ICD-10-CM | POA: Diagnosis not present

## 2018-09-15 DIAGNOSIS — N2581 Secondary hyperparathyroidism of renal origin: Secondary | ICD-10-CM | POA: Diagnosis not present

## 2018-09-15 DIAGNOSIS — D509 Iron deficiency anemia, unspecified: Secondary | ICD-10-CM | POA: Diagnosis not present

## 2018-09-16 DIAGNOSIS — N186 End stage renal disease: Secondary | ICD-10-CM | POA: Diagnosis not present

## 2018-09-16 DIAGNOSIS — D509 Iron deficiency anemia, unspecified: Secondary | ICD-10-CM | POA: Diagnosis not present

## 2018-09-16 DIAGNOSIS — N2581 Secondary hyperparathyroidism of renal origin: Secondary | ICD-10-CM | POA: Diagnosis not present

## 2018-09-16 DIAGNOSIS — Z992 Dependence on renal dialysis: Secondary | ICD-10-CM | POA: Diagnosis not present

## 2018-09-17 DIAGNOSIS — N2581 Secondary hyperparathyroidism of renal origin: Secondary | ICD-10-CM | POA: Diagnosis not present

## 2018-09-17 DIAGNOSIS — N186 End stage renal disease: Secondary | ICD-10-CM | POA: Diagnosis not present

## 2018-09-17 DIAGNOSIS — D509 Iron deficiency anemia, unspecified: Secondary | ICD-10-CM | POA: Diagnosis not present

## 2018-09-17 DIAGNOSIS — Z992 Dependence on renal dialysis: Secondary | ICD-10-CM | POA: Diagnosis not present

## 2018-09-18 DIAGNOSIS — N2581 Secondary hyperparathyroidism of renal origin: Secondary | ICD-10-CM | POA: Diagnosis not present

## 2018-09-18 DIAGNOSIS — D509 Iron deficiency anemia, unspecified: Secondary | ICD-10-CM | POA: Diagnosis not present

## 2018-09-18 DIAGNOSIS — N186 End stage renal disease: Secondary | ICD-10-CM | POA: Diagnosis not present

## 2018-09-18 DIAGNOSIS — Z992 Dependence on renal dialysis: Secondary | ICD-10-CM | POA: Diagnosis not present

## 2018-09-20 DIAGNOSIS — N2581 Secondary hyperparathyroidism of renal origin: Secondary | ICD-10-CM | POA: Diagnosis not present

## 2018-09-20 DIAGNOSIS — Z992 Dependence on renal dialysis: Secondary | ICD-10-CM | POA: Diagnosis not present

## 2018-09-20 DIAGNOSIS — D509 Iron deficiency anemia, unspecified: Secondary | ICD-10-CM | POA: Diagnosis not present

## 2018-09-20 DIAGNOSIS — N186 End stage renal disease: Secondary | ICD-10-CM | POA: Diagnosis not present

## 2018-09-21 DIAGNOSIS — Z992 Dependence on renal dialysis: Secondary | ICD-10-CM | POA: Diagnosis not present

## 2018-09-21 DIAGNOSIS — D509 Iron deficiency anemia, unspecified: Secondary | ICD-10-CM | POA: Diagnosis not present

## 2018-09-21 DIAGNOSIS — N186 End stage renal disease: Secondary | ICD-10-CM | POA: Diagnosis not present

## 2018-09-21 DIAGNOSIS — N2581 Secondary hyperparathyroidism of renal origin: Secondary | ICD-10-CM | POA: Diagnosis not present

## 2018-09-22 DIAGNOSIS — N2581 Secondary hyperparathyroidism of renal origin: Secondary | ICD-10-CM | POA: Diagnosis not present

## 2018-09-22 DIAGNOSIS — Z992 Dependence on renal dialysis: Secondary | ICD-10-CM | POA: Diagnosis not present

## 2018-09-22 DIAGNOSIS — N186 End stage renal disease: Secondary | ICD-10-CM | POA: Diagnosis not present

## 2018-09-22 DIAGNOSIS — D509 Iron deficiency anemia, unspecified: Secondary | ICD-10-CM | POA: Diagnosis not present

## 2018-09-23 DIAGNOSIS — Z992 Dependence on renal dialysis: Secondary | ICD-10-CM | POA: Diagnosis not present

## 2018-09-23 DIAGNOSIS — N186 End stage renal disease: Secondary | ICD-10-CM | POA: Diagnosis not present

## 2018-09-23 DIAGNOSIS — N2581 Secondary hyperparathyroidism of renal origin: Secondary | ICD-10-CM | POA: Diagnosis not present

## 2018-09-23 DIAGNOSIS — D509 Iron deficiency anemia, unspecified: Secondary | ICD-10-CM | POA: Diagnosis not present

## 2018-09-24 DIAGNOSIS — N2581 Secondary hyperparathyroidism of renal origin: Secondary | ICD-10-CM | POA: Diagnosis not present

## 2018-09-24 DIAGNOSIS — D509 Iron deficiency anemia, unspecified: Secondary | ICD-10-CM | POA: Diagnosis not present

## 2018-09-24 DIAGNOSIS — N186 End stage renal disease: Secondary | ICD-10-CM | POA: Diagnosis not present

## 2018-09-24 DIAGNOSIS — Z992 Dependence on renal dialysis: Secondary | ICD-10-CM | POA: Diagnosis not present

## 2018-09-25 DIAGNOSIS — D509 Iron deficiency anemia, unspecified: Secondary | ICD-10-CM | POA: Diagnosis not present

## 2018-09-25 DIAGNOSIS — N2581 Secondary hyperparathyroidism of renal origin: Secondary | ICD-10-CM | POA: Diagnosis not present

## 2018-09-25 DIAGNOSIS — N186 End stage renal disease: Secondary | ICD-10-CM | POA: Diagnosis not present

## 2018-09-25 DIAGNOSIS — Z992 Dependence on renal dialysis: Secondary | ICD-10-CM | POA: Diagnosis not present

## 2018-09-27 DIAGNOSIS — E1100 Type 2 diabetes mellitus with hyperosmolarity without nonketotic hyperglycemic-hyperosmolar coma (NKHHC): Secondary | ICD-10-CM | POA: Diagnosis not present

## 2018-09-27 DIAGNOSIS — R0602 Shortness of breath: Secondary | ICD-10-CM | POA: Diagnosis not present

## 2018-09-27 DIAGNOSIS — I519 Heart disease, unspecified: Secondary | ICD-10-CM | POA: Diagnosis not present

## 2018-09-27 DIAGNOSIS — I429 Cardiomyopathy, unspecified: Secondary | ICD-10-CM | POA: Diagnosis not present

## 2018-09-27 DIAGNOSIS — I5022 Chronic systolic (congestive) heart failure: Secondary | ICD-10-CM | POA: Diagnosis not present

## 2018-09-27 DIAGNOSIS — I480 Paroxysmal atrial fibrillation: Secondary | ICD-10-CM | POA: Diagnosis not present

## 2018-09-27 DIAGNOSIS — I493 Ventricular premature depolarization: Secondary | ICD-10-CM | POA: Diagnosis not present

## 2018-09-27 DIAGNOSIS — D509 Iron deficiency anemia, unspecified: Secondary | ICD-10-CM | POA: Diagnosis not present

## 2018-09-27 DIAGNOSIS — N2581 Secondary hyperparathyroidism of renal origin: Secondary | ICD-10-CM | POA: Diagnosis not present

## 2018-09-27 DIAGNOSIS — N186 End stage renal disease: Secondary | ICD-10-CM | POA: Diagnosis not present

## 2018-09-27 DIAGNOSIS — I10 Essential (primary) hypertension: Secondary | ICD-10-CM | POA: Diagnosis not present

## 2018-09-27 DIAGNOSIS — Z992 Dependence on renal dialysis: Secondary | ICD-10-CM | POA: Diagnosis not present

## 2018-09-27 DIAGNOSIS — Z955 Presence of coronary angioplasty implant and graft: Secondary | ICD-10-CM | POA: Diagnosis not present

## 2018-09-27 DIAGNOSIS — E7800 Pure hypercholesterolemia, unspecified: Secondary | ICD-10-CM | POA: Diagnosis not present

## 2018-09-27 DIAGNOSIS — I2510 Atherosclerotic heart disease of native coronary artery without angina pectoris: Secondary | ICD-10-CM | POA: Diagnosis not present

## 2018-09-27 DIAGNOSIS — I255 Ischemic cardiomyopathy: Secondary | ICD-10-CM | POA: Diagnosis not present

## 2018-09-28 DIAGNOSIS — N186 End stage renal disease: Secondary | ICD-10-CM | POA: Diagnosis not present

## 2018-09-28 DIAGNOSIS — D509 Iron deficiency anemia, unspecified: Secondary | ICD-10-CM | POA: Diagnosis not present

## 2018-09-28 DIAGNOSIS — N2581 Secondary hyperparathyroidism of renal origin: Secondary | ICD-10-CM | POA: Diagnosis not present

## 2018-09-28 DIAGNOSIS — Z992 Dependence on renal dialysis: Secondary | ICD-10-CM | POA: Diagnosis not present

## 2018-09-29 DIAGNOSIS — N2581 Secondary hyperparathyroidism of renal origin: Secondary | ICD-10-CM | POA: Diagnosis not present

## 2018-09-29 DIAGNOSIS — N186 End stage renal disease: Secondary | ICD-10-CM | POA: Diagnosis not present

## 2018-09-29 DIAGNOSIS — Z992 Dependence on renal dialysis: Secondary | ICD-10-CM | POA: Diagnosis not present

## 2018-09-29 DIAGNOSIS — D509 Iron deficiency anemia, unspecified: Secondary | ICD-10-CM | POA: Diagnosis not present

## 2018-09-30 DIAGNOSIS — N2581 Secondary hyperparathyroidism of renal origin: Secondary | ICD-10-CM | POA: Diagnosis not present

## 2018-09-30 DIAGNOSIS — I110 Hypertensive heart disease with heart failure: Secondary | ICD-10-CM | POA: Diagnosis not present

## 2018-09-30 DIAGNOSIS — D509 Iron deficiency anemia, unspecified: Secondary | ICD-10-CM | POA: Diagnosis not present

## 2018-09-30 DIAGNOSIS — N186 End stage renal disease: Secondary | ICD-10-CM | POA: Diagnosis not present

## 2018-09-30 DIAGNOSIS — I43 Cardiomyopathy in diseases classified elsewhere: Secondary | ICD-10-CM | POA: Diagnosis not present

## 2018-09-30 DIAGNOSIS — I119 Hypertensive heart disease without heart failure: Secondary | ICD-10-CM | POA: Diagnosis not present

## 2018-09-30 DIAGNOSIS — Z992 Dependence on renal dialysis: Secondary | ICD-10-CM | POA: Diagnosis not present

## 2018-10-02 DIAGNOSIS — N186 End stage renal disease: Secondary | ICD-10-CM | POA: Diagnosis not present

## 2018-10-02 DIAGNOSIS — Z992 Dependence on renal dialysis: Secondary | ICD-10-CM | POA: Diagnosis not present

## 2018-10-02 DIAGNOSIS — N2581 Secondary hyperparathyroidism of renal origin: Secondary | ICD-10-CM | POA: Diagnosis not present

## 2018-10-02 DIAGNOSIS — D509 Iron deficiency anemia, unspecified: Secondary | ICD-10-CM | POA: Diagnosis not present

## 2018-10-04 DIAGNOSIS — Z992 Dependence on renal dialysis: Secondary | ICD-10-CM | POA: Diagnosis not present

## 2018-10-04 DIAGNOSIS — D509 Iron deficiency anemia, unspecified: Secondary | ICD-10-CM | POA: Diagnosis not present

## 2018-10-04 DIAGNOSIS — N186 End stage renal disease: Secondary | ICD-10-CM | POA: Diagnosis not present

## 2018-10-04 DIAGNOSIS — N2581 Secondary hyperparathyroidism of renal origin: Secondary | ICD-10-CM | POA: Diagnosis not present

## 2018-10-05 DIAGNOSIS — Z992 Dependence on renal dialysis: Secondary | ICD-10-CM | POA: Diagnosis not present

## 2018-10-05 DIAGNOSIS — B20 Human immunodeficiency virus [HIV] disease: Secondary | ICD-10-CM | POA: Diagnosis not present

## 2018-10-05 DIAGNOSIS — I509 Heart failure, unspecified: Secondary | ICD-10-CM | POA: Diagnosis not present

## 2018-10-05 DIAGNOSIS — N186 End stage renal disease: Secondary | ICD-10-CM | POA: Diagnosis not present

## 2018-10-05 DIAGNOSIS — N2581 Secondary hyperparathyroidism of renal origin: Secondary | ICD-10-CM | POA: Diagnosis not present

## 2018-10-05 DIAGNOSIS — I4891 Unspecified atrial fibrillation: Secondary | ICD-10-CM | POA: Diagnosis not present

## 2018-10-05 DIAGNOSIS — D509 Iron deficiency anemia, unspecified: Secondary | ICD-10-CM | POA: Diagnosis not present

## 2018-10-06 DIAGNOSIS — N2581 Secondary hyperparathyroidism of renal origin: Secondary | ICD-10-CM | POA: Diagnosis not present

## 2018-10-06 DIAGNOSIS — Z992 Dependence on renal dialysis: Secondary | ICD-10-CM | POA: Diagnosis not present

## 2018-10-06 DIAGNOSIS — N186 End stage renal disease: Secondary | ICD-10-CM | POA: Diagnosis not present

## 2018-10-06 DIAGNOSIS — D509 Iron deficiency anemia, unspecified: Secondary | ICD-10-CM | POA: Diagnosis not present

## 2018-10-07 DIAGNOSIS — N2581 Secondary hyperparathyroidism of renal origin: Secondary | ICD-10-CM | POA: Diagnosis not present

## 2018-10-07 DIAGNOSIS — Z992 Dependence on renal dialysis: Secondary | ICD-10-CM | POA: Diagnosis not present

## 2018-10-07 DIAGNOSIS — N186 End stage renal disease: Secondary | ICD-10-CM | POA: Diagnosis not present

## 2018-10-07 DIAGNOSIS — D509 Iron deficiency anemia, unspecified: Secondary | ICD-10-CM | POA: Diagnosis not present

## 2018-10-08 DIAGNOSIS — D509 Iron deficiency anemia, unspecified: Secondary | ICD-10-CM | POA: Diagnosis not present

## 2018-10-08 DIAGNOSIS — Z992 Dependence on renal dialysis: Secondary | ICD-10-CM | POA: Diagnosis not present

## 2018-10-08 DIAGNOSIS — N2581 Secondary hyperparathyroidism of renal origin: Secondary | ICD-10-CM | POA: Diagnosis not present

## 2018-10-08 DIAGNOSIS — N186 End stage renal disease: Secondary | ICD-10-CM | POA: Diagnosis not present

## 2018-10-09 DIAGNOSIS — N186 End stage renal disease: Secondary | ICD-10-CM | POA: Diagnosis not present

## 2018-10-09 DIAGNOSIS — N2581 Secondary hyperparathyroidism of renal origin: Secondary | ICD-10-CM | POA: Diagnosis not present

## 2018-10-09 DIAGNOSIS — Z992 Dependence on renal dialysis: Secondary | ICD-10-CM | POA: Diagnosis not present

## 2018-10-09 DIAGNOSIS — D509 Iron deficiency anemia, unspecified: Secondary | ICD-10-CM | POA: Diagnosis not present

## 2018-10-11 DIAGNOSIS — N186 End stage renal disease: Secondary | ICD-10-CM | POA: Diagnosis not present

## 2018-10-11 DIAGNOSIS — Z992 Dependence on renal dialysis: Secondary | ICD-10-CM | POA: Diagnosis not present

## 2018-10-11 DIAGNOSIS — D509 Iron deficiency anemia, unspecified: Secondary | ICD-10-CM | POA: Diagnosis not present

## 2018-10-11 DIAGNOSIS — N2581 Secondary hyperparathyroidism of renal origin: Secondary | ICD-10-CM | POA: Diagnosis not present

## 2018-10-12 DIAGNOSIS — N186 End stage renal disease: Secondary | ICD-10-CM | POA: Diagnosis not present

## 2018-10-12 DIAGNOSIS — N2581 Secondary hyperparathyroidism of renal origin: Secondary | ICD-10-CM | POA: Diagnosis not present

## 2018-10-12 DIAGNOSIS — D509 Iron deficiency anemia, unspecified: Secondary | ICD-10-CM | POA: Diagnosis not present

## 2018-10-12 DIAGNOSIS — Z992 Dependence on renal dialysis: Secondary | ICD-10-CM | POA: Diagnosis not present

## 2018-10-13 DIAGNOSIS — N2581 Secondary hyperparathyroidism of renal origin: Secondary | ICD-10-CM | POA: Diagnosis not present

## 2018-10-13 DIAGNOSIS — D509 Iron deficiency anemia, unspecified: Secondary | ICD-10-CM | POA: Diagnosis not present

## 2018-10-13 DIAGNOSIS — Z992 Dependence on renal dialysis: Secondary | ICD-10-CM | POA: Diagnosis not present

## 2018-10-13 DIAGNOSIS — N186 End stage renal disease: Secondary | ICD-10-CM | POA: Diagnosis not present

## 2018-10-14 DIAGNOSIS — Z992 Dependence on renal dialysis: Secondary | ICD-10-CM | POA: Diagnosis not present

## 2018-10-14 DIAGNOSIS — N186 End stage renal disease: Secondary | ICD-10-CM | POA: Diagnosis not present

## 2018-10-14 DIAGNOSIS — D509 Iron deficiency anemia, unspecified: Secondary | ICD-10-CM | POA: Diagnosis not present

## 2018-10-14 DIAGNOSIS — N2581 Secondary hyperparathyroidism of renal origin: Secondary | ICD-10-CM | POA: Diagnosis not present

## 2018-10-15 DIAGNOSIS — N186 End stage renal disease: Secondary | ICD-10-CM | POA: Diagnosis not present

## 2018-10-15 DIAGNOSIS — Z992 Dependence on renal dialysis: Secondary | ICD-10-CM | POA: Diagnosis not present

## 2018-10-15 DIAGNOSIS — N2581 Secondary hyperparathyroidism of renal origin: Secondary | ICD-10-CM | POA: Diagnosis not present

## 2018-10-15 DIAGNOSIS — D509 Iron deficiency anemia, unspecified: Secondary | ICD-10-CM | POA: Diagnosis not present

## 2018-10-16 DIAGNOSIS — N186 End stage renal disease: Secondary | ICD-10-CM | POA: Diagnosis not present

## 2018-10-16 DIAGNOSIS — Z992 Dependence on renal dialysis: Secondary | ICD-10-CM | POA: Diagnosis not present

## 2018-10-16 DIAGNOSIS — N2581 Secondary hyperparathyroidism of renal origin: Secondary | ICD-10-CM | POA: Diagnosis not present

## 2018-10-16 DIAGNOSIS — D509 Iron deficiency anemia, unspecified: Secondary | ICD-10-CM | POA: Diagnosis not present

## 2018-10-18 DIAGNOSIS — N186 End stage renal disease: Secondary | ICD-10-CM | POA: Diagnosis not present

## 2018-10-18 DIAGNOSIS — D509 Iron deficiency anemia, unspecified: Secondary | ICD-10-CM | POA: Diagnosis not present

## 2018-10-18 DIAGNOSIS — Z992 Dependence on renal dialysis: Secondary | ICD-10-CM | POA: Diagnosis not present

## 2018-10-18 DIAGNOSIS — N2581 Secondary hyperparathyroidism of renal origin: Secondary | ICD-10-CM | POA: Diagnosis not present

## 2018-10-19 DIAGNOSIS — D509 Iron deficiency anemia, unspecified: Secondary | ICD-10-CM | POA: Diagnosis not present

## 2018-10-19 DIAGNOSIS — N186 End stage renal disease: Secondary | ICD-10-CM | POA: Diagnosis not present

## 2018-10-19 DIAGNOSIS — Z992 Dependence on renal dialysis: Secondary | ICD-10-CM | POA: Diagnosis not present

## 2018-10-19 DIAGNOSIS — N2581 Secondary hyperparathyroidism of renal origin: Secondary | ICD-10-CM | POA: Diagnosis not present

## 2018-10-20 DIAGNOSIS — N186 End stage renal disease: Secondary | ICD-10-CM | POA: Diagnosis not present

## 2018-10-20 DIAGNOSIS — Z992 Dependence on renal dialysis: Secondary | ICD-10-CM | POA: Diagnosis not present

## 2018-10-20 DIAGNOSIS — N2581 Secondary hyperparathyroidism of renal origin: Secondary | ICD-10-CM | POA: Diagnosis not present

## 2018-10-20 DIAGNOSIS — D509 Iron deficiency anemia, unspecified: Secondary | ICD-10-CM | POA: Diagnosis not present

## 2018-10-21 DIAGNOSIS — N186 End stage renal disease: Secondary | ICD-10-CM | POA: Diagnosis not present

## 2018-10-21 DIAGNOSIS — Z992 Dependence on renal dialysis: Secondary | ICD-10-CM | POA: Diagnosis not present

## 2018-10-21 DIAGNOSIS — D509 Iron deficiency anemia, unspecified: Secondary | ICD-10-CM | POA: Diagnosis not present

## 2018-10-21 DIAGNOSIS — N2581 Secondary hyperparathyroidism of renal origin: Secondary | ICD-10-CM | POA: Diagnosis not present

## 2018-10-22 DIAGNOSIS — Z992 Dependence on renal dialysis: Secondary | ICD-10-CM | POA: Diagnosis not present

## 2018-10-22 DIAGNOSIS — N186 End stage renal disease: Secondary | ICD-10-CM | POA: Diagnosis not present

## 2018-10-22 DIAGNOSIS — N2581 Secondary hyperparathyroidism of renal origin: Secondary | ICD-10-CM | POA: Diagnosis not present

## 2018-10-22 DIAGNOSIS — D509 Iron deficiency anemia, unspecified: Secondary | ICD-10-CM | POA: Diagnosis not present

## 2018-10-23 DIAGNOSIS — N186 End stage renal disease: Secondary | ICD-10-CM | POA: Diagnosis not present

## 2018-10-23 DIAGNOSIS — Z992 Dependence on renal dialysis: Secondary | ICD-10-CM | POA: Diagnosis not present

## 2018-10-23 DIAGNOSIS — D509 Iron deficiency anemia, unspecified: Secondary | ICD-10-CM | POA: Diagnosis not present

## 2018-10-23 DIAGNOSIS — N2581 Secondary hyperparathyroidism of renal origin: Secondary | ICD-10-CM | POA: Diagnosis not present

## 2018-10-25 DIAGNOSIS — D509 Iron deficiency anemia, unspecified: Secondary | ICD-10-CM | POA: Diagnosis not present

## 2018-10-25 DIAGNOSIS — N2581 Secondary hyperparathyroidism of renal origin: Secondary | ICD-10-CM | POA: Diagnosis not present

## 2018-10-25 DIAGNOSIS — N186 End stage renal disease: Secondary | ICD-10-CM | POA: Diagnosis not present

## 2018-10-25 DIAGNOSIS — E785 Hyperlipidemia, unspecified: Secondary | ICD-10-CM | POA: Diagnosis not present

## 2018-10-25 DIAGNOSIS — Z992 Dependence on renal dialysis: Secondary | ICD-10-CM | POA: Diagnosis not present

## 2018-10-25 DIAGNOSIS — E119 Type 2 diabetes mellitus without complications: Secondary | ICD-10-CM | POA: Diagnosis not present

## 2018-10-26 DIAGNOSIS — N186 End stage renal disease: Secondary | ICD-10-CM | POA: Diagnosis not present

## 2018-10-26 DIAGNOSIS — D509 Iron deficiency anemia, unspecified: Secondary | ICD-10-CM | POA: Diagnosis not present

## 2018-10-26 DIAGNOSIS — N2581 Secondary hyperparathyroidism of renal origin: Secondary | ICD-10-CM | POA: Diagnosis not present

## 2018-10-26 DIAGNOSIS — Z992 Dependence on renal dialysis: Secondary | ICD-10-CM | POA: Diagnosis not present

## 2018-10-27 DIAGNOSIS — Z992 Dependence on renal dialysis: Secondary | ICD-10-CM | POA: Diagnosis not present

## 2018-10-27 DIAGNOSIS — N186 End stage renal disease: Secondary | ICD-10-CM | POA: Diagnosis not present

## 2018-10-27 DIAGNOSIS — D509 Iron deficiency anemia, unspecified: Secondary | ICD-10-CM | POA: Diagnosis not present

## 2018-10-27 DIAGNOSIS — N2581 Secondary hyperparathyroidism of renal origin: Secondary | ICD-10-CM | POA: Diagnosis not present

## 2018-10-28 DIAGNOSIS — N2581 Secondary hyperparathyroidism of renal origin: Secondary | ICD-10-CM | POA: Diagnosis not present

## 2018-10-28 DIAGNOSIS — D509 Iron deficiency anemia, unspecified: Secondary | ICD-10-CM | POA: Diagnosis not present

## 2018-10-28 DIAGNOSIS — Z992 Dependence on renal dialysis: Secondary | ICD-10-CM | POA: Diagnosis not present

## 2018-10-28 DIAGNOSIS — N186 End stage renal disease: Secondary | ICD-10-CM | POA: Diagnosis not present

## 2018-10-29 DIAGNOSIS — D509 Iron deficiency anemia, unspecified: Secondary | ICD-10-CM | POA: Diagnosis not present

## 2018-10-29 DIAGNOSIS — Z992 Dependence on renal dialysis: Secondary | ICD-10-CM | POA: Diagnosis not present

## 2018-10-29 DIAGNOSIS — N2581 Secondary hyperparathyroidism of renal origin: Secondary | ICD-10-CM | POA: Diagnosis not present

## 2018-10-29 DIAGNOSIS — N186 End stage renal disease: Secondary | ICD-10-CM | POA: Diagnosis not present

## 2018-10-30 DIAGNOSIS — Z992 Dependence on renal dialysis: Secondary | ICD-10-CM | POA: Diagnosis not present

## 2018-10-30 DIAGNOSIS — D509 Iron deficiency anemia, unspecified: Secondary | ICD-10-CM | POA: Diagnosis not present

## 2018-10-30 DIAGNOSIS — N186 End stage renal disease: Secondary | ICD-10-CM | POA: Diagnosis not present

## 2018-10-30 DIAGNOSIS — N2581 Secondary hyperparathyroidism of renal origin: Secondary | ICD-10-CM | POA: Diagnosis not present

## 2018-11-01 DIAGNOSIS — Z992 Dependence on renal dialysis: Secondary | ICD-10-CM | POA: Diagnosis not present

## 2018-11-01 DIAGNOSIS — N186 End stage renal disease: Secondary | ICD-10-CM | POA: Diagnosis not present

## 2018-11-01 DIAGNOSIS — N2581 Secondary hyperparathyroidism of renal origin: Secondary | ICD-10-CM | POA: Diagnosis not present

## 2018-11-01 DIAGNOSIS — D509 Iron deficiency anemia, unspecified: Secondary | ICD-10-CM | POA: Diagnosis not present

## 2018-11-02 DIAGNOSIS — N186 End stage renal disease: Secondary | ICD-10-CM | POA: Diagnosis not present

## 2018-11-02 DIAGNOSIS — N2581 Secondary hyperparathyroidism of renal origin: Secondary | ICD-10-CM | POA: Diagnosis not present

## 2018-11-02 DIAGNOSIS — D509 Iron deficiency anemia, unspecified: Secondary | ICD-10-CM | POA: Diagnosis not present

## 2018-11-02 DIAGNOSIS — Z992 Dependence on renal dialysis: Secondary | ICD-10-CM | POA: Diagnosis not present

## 2018-11-03 DIAGNOSIS — Z992 Dependence on renal dialysis: Secondary | ICD-10-CM | POA: Diagnosis not present

## 2018-11-03 DIAGNOSIS — D509 Iron deficiency anemia, unspecified: Secondary | ICD-10-CM | POA: Diagnosis not present

## 2018-11-03 DIAGNOSIS — N2581 Secondary hyperparathyroidism of renal origin: Secondary | ICD-10-CM | POA: Diagnosis not present

## 2018-11-03 DIAGNOSIS — N186 End stage renal disease: Secondary | ICD-10-CM | POA: Diagnosis not present

## 2018-11-04 DIAGNOSIS — N186 End stage renal disease: Secondary | ICD-10-CM | POA: Diagnosis not present

## 2018-11-04 DIAGNOSIS — D509 Iron deficiency anemia, unspecified: Secondary | ICD-10-CM | POA: Diagnosis not present

## 2018-11-04 DIAGNOSIS — N2581 Secondary hyperparathyroidism of renal origin: Secondary | ICD-10-CM | POA: Diagnosis not present

## 2018-11-04 DIAGNOSIS — Z992 Dependence on renal dialysis: Secondary | ICD-10-CM | POA: Diagnosis not present

## 2018-11-05 DIAGNOSIS — N186 End stage renal disease: Secondary | ICD-10-CM | POA: Diagnosis not present

## 2018-11-05 DIAGNOSIS — N2581 Secondary hyperparathyroidism of renal origin: Secondary | ICD-10-CM | POA: Diagnosis not present

## 2018-11-05 DIAGNOSIS — D509 Iron deficiency anemia, unspecified: Secondary | ICD-10-CM | POA: Diagnosis not present

## 2018-11-05 DIAGNOSIS — Z992 Dependence on renal dialysis: Secondary | ICD-10-CM | POA: Diagnosis not present

## 2018-11-06 DIAGNOSIS — D509 Iron deficiency anemia, unspecified: Secondary | ICD-10-CM | POA: Diagnosis not present

## 2018-11-06 DIAGNOSIS — N186 End stage renal disease: Secondary | ICD-10-CM | POA: Diagnosis not present

## 2018-11-06 DIAGNOSIS — Z992 Dependence on renal dialysis: Secondary | ICD-10-CM | POA: Diagnosis not present

## 2018-11-06 DIAGNOSIS — N2581 Secondary hyperparathyroidism of renal origin: Secondary | ICD-10-CM | POA: Diagnosis not present

## 2018-11-09 DIAGNOSIS — D509 Iron deficiency anemia, unspecified: Secondary | ICD-10-CM | POA: Diagnosis not present

## 2018-11-09 DIAGNOSIS — N186 End stage renal disease: Secondary | ICD-10-CM | POA: Diagnosis not present

## 2018-11-09 DIAGNOSIS — Z992 Dependence on renal dialysis: Secondary | ICD-10-CM | POA: Diagnosis not present

## 2018-11-09 DIAGNOSIS — N2581 Secondary hyperparathyroidism of renal origin: Secondary | ICD-10-CM | POA: Diagnosis not present

## 2018-11-11 DIAGNOSIS — D509 Iron deficiency anemia, unspecified: Secondary | ICD-10-CM | POA: Diagnosis not present

## 2018-11-11 DIAGNOSIS — N2581 Secondary hyperparathyroidism of renal origin: Secondary | ICD-10-CM | POA: Diagnosis not present

## 2018-11-11 DIAGNOSIS — N186 End stage renal disease: Secondary | ICD-10-CM | POA: Diagnosis not present

## 2018-11-11 DIAGNOSIS — Z992 Dependence on renal dialysis: Secondary | ICD-10-CM | POA: Diagnosis not present

## 2018-11-12 DIAGNOSIS — Z992 Dependence on renal dialysis: Secondary | ICD-10-CM | POA: Diagnosis not present

## 2018-11-12 DIAGNOSIS — N186 End stage renal disease: Secondary | ICD-10-CM | POA: Diagnosis not present

## 2018-11-12 DIAGNOSIS — D509 Iron deficiency anemia, unspecified: Secondary | ICD-10-CM | POA: Diagnosis not present

## 2018-11-12 DIAGNOSIS — N2581 Secondary hyperparathyroidism of renal origin: Secondary | ICD-10-CM | POA: Diagnosis not present

## 2018-11-13 DIAGNOSIS — N186 End stage renal disease: Secondary | ICD-10-CM | POA: Diagnosis not present

## 2018-11-13 DIAGNOSIS — D509 Iron deficiency anemia, unspecified: Secondary | ICD-10-CM | POA: Diagnosis not present

## 2018-11-13 DIAGNOSIS — Z992 Dependence on renal dialysis: Secondary | ICD-10-CM | POA: Diagnosis not present

## 2018-11-13 DIAGNOSIS — N2581 Secondary hyperparathyroidism of renal origin: Secondary | ICD-10-CM | POA: Diagnosis not present

## 2018-11-15 DIAGNOSIS — Z992 Dependence on renal dialysis: Secondary | ICD-10-CM | POA: Diagnosis not present

## 2018-11-15 DIAGNOSIS — N186 End stage renal disease: Secondary | ICD-10-CM | POA: Diagnosis not present

## 2018-11-15 DIAGNOSIS — N2581 Secondary hyperparathyroidism of renal origin: Secondary | ICD-10-CM | POA: Diagnosis not present

## 2018-11-15 DIAGNOSIS — D509 Iron deficiency anemia, unspecified: Secondary | ICD-10-CM | POA: Diagnosis not present

## 2018-11-17 DIAGNOSIS — Z992 Dependence on renal dialysis: Secondary | ICD-10-CM | POA: Diagnosis not present

## 2018-11-17 DIAGNOSIS — D509 Iron deficiency anemia, unspecified: Secondary | ICD-10-CM | POA: Diagnosis not present

## 2018-11-17 DIAGNOSIS — N2581 Secondary hyperparathyroidism of renal origin: Secondary | ICD-10-CM | POA: Diagnosis not present

## 2018-11-17 DIAGNOSIS — N186 End stage renal disease: Secondary | ICD-10-CM | POA: Diagnosis not present

## 2018-11-18 DIAGNOSIS — D509 Iron deficiency anemia, unspecified: Secondary | ICD-10-CM | POA: Diagnosis not present

## 2018-11-18 DIAGNOSIS — N2581 Secondary hyperparathyroidism of renal origin: Secondary | ICD-10-CM | POA: Diagnosis not present

## 2018-11-18 DIAGNOSIS — Z992 Dependence on renal dialysis: Secondary | ICD-10-CM | POA: Diagnosis not present

## 2018-11-18 DIAGNOSIS — N186 End stage renal disease: Secondary | ICD-10-CM | POA: Diagnosis not present

## 2018-11-19 DIAGNOSIS — D509 Iron deficiency anemia, unspecified: Secondary | ICD-10-CM | POA: Diagnosis not present

## 2018-11-19 DIAGNOSIS — N2581 Secondary hyperparathyroidism of renal origin: Secondary | ICD-10-CM | POA: Diagnosis not present

## 2018-11-19 DIAGNOSIS — N186 End stage renal disease: Secondary | ICD-10-CM | POA: Diagnosis not present

## 2018-11-19 DIAGNOSIS — Z992 Dependence on renal dialysis: Secondary | ICD-10-CM | POA: Diagnosis not present

## 2018-11-23 DIAGNOSIS — D509 Iron deficiency anemia, unspecified: Secondary | ICD-10-CM | POA: Diagnosis not present

## 2018-11-23 DIAGNOSIS — N186 End stage renal disease: Secondary | ICD-10-CM | POA: Diagnosis not present

## 2018-11-23 DIAGNOSIS — Z992 Dependence on renal dialysis: Secondary | ICD-10-CM | POA: Diagnosis not present

## 2018-11-23 DIAGNOSIS — N2581 Secondary hyperparathyroidism of renal origin: Secondary | ICD-10-CM | POA: Diagnosis not present

## 2018-11-25 DIAGNOSIS — N2581 Secondary hyperparathyroidism of renal origin: Secondary | ICD-10-CM | POA: Diagnosis not present

## 2018-11-25 DIAGNOSIS — Z992 Dependence on renal dialysis: Secondary | ICD-10-CM | POA: Diagnosis not present

## 2018-11-25 DIAGNOSIS — D509 Iron deficiency anemia, unspecified: Secondary | ICD-10-CM | POA: Diagnosis not present

## 2018-11-25 DIAGNOSIS — N186 End stage renal disease: Secondary | ICD-10-CM | POA: Diagnosis not present

## 2018-11-26 DIAGNOSIS — D509 Iron deficiency anemia, unspecified: Secondary | ICD-10-CM | POA: Diagnosis not present

## 2018-11-26 DIAGNOSIS — Z992 Dependence on renal dialysis: Secondary | ICD-10-CM | POA: Diagnosis not present

## 2018-11-26 DIAGNOSIS — N186 End stage renal disease: Secondary | ICD-10-CM | POA: Diagnosis not present

## 2018-11-26 DIAGNOSIS — N2581 Secondary hyperparathyroidism of renal origin: Secondary | ICD-10-CM | POA: Diagnosis not present

## 2018-11-27 DIAGNOSIS — Z992 Dependence on renal dialysis: Secondary | ICD-10-CM | POA: Diagnosis not present

## 2018-11-27 DIAGNOSIS — N186 End stage renal disease: Secondary | ICD-10-CM | POA: Diagnosis not present

## 2018-11-27 DIAGNOSIS — D509 Iron deficiency anemia, unspecified: Secondary | ICD-10-CM | POA: Diagnosis not present

## 2018-11-27 DIAGNOSIS — N2581 Secondary hyperparathyroidism of renal origin: Secondary | ICD-10-CM | POA: Diagnosis not present

## 2018-11-29 DIAGNOSIS — D509 Iron deficiency anemia, unspecified: Secondary | ICD-10-CM | POA: Diagnosis not present

## 2018-11-29 DIAGNOSIS — Z992 Dependence on renal dialysis: Secondary | ICD-10-CM | POA: Diagnosis not present

## 2018-11-29 DIAGNOSIS — N2581 Secondary hyperparathyroidism of renal origin: Secondary | ICD-10-CM | POA: Diagnosis not present

## 2018-11-29 DIAGNOSIS — N186 End stage renal disease: Secondary | ICD-10-CM | POA: Diagnosis not present

## 2018-11-30 DIAGNOSIS — D509 Iron deficiency anemia, unspecified: Secondary | ICD-10-CM | POA: Diagnosis not present

## 2018-11-30 DIAGNOSIS — N2581 Secondary hyperparathyroidism of renal origin: Secondary | ICD-10-CM | POA: Diagnosis not present

## 2018-11-30 DIAGNOSIS — N186 End stage renal disease: Secondary | ICD-10-CM | POA: Diagnosis not present

## 2018-11-30 DIAGNOSIS — Z992 Dependence on renal dialysis: Secondary | ICD-10-CM | POA: Diagnosis not present

## 2018-12-01 DIAGNOSIS — Z992 Dependence on renal dialysis: Secondary | ICD-10-CM | POA: Diagnosis not present

## 2018-12-01 DIAGNOSIS — N2581 Secondary hyperparathyroidism of renal origin: Secondary | ICD-10-CM | POA: Diagnosis not present

## 2018-12-01 DIAGNOSIS — N186 End stage renal disease: Secondary | ICD-10-CM | POA: Diagnosis not present

## 2018-12-01 DIAGNOSIS — D509 Iron deficiency anemia, unspecified: Secondary | ICD-10-CM | POA: Diagnosis not present

## 2018-12-02 DIAGNOSIS — N186 End stage renal disease: Secondary | ICD-10-CM | POA: Diagnosis not present

## 2018-12-02 DIAGNOSIS — Z992 Dependence on renal dialysis: Secondary | ICD-10-CM | POA: Diagnosis not present

## 2018-12-02 DIAGNOSIS — N2581 Secondary hyperparathyroidism of renal origin: Secondary | ICD-10-CM | POA: Diagnosis not present

## 2018-12-02 DIAGNOSIS — D509 Iron deficiency anemia, unspecified: Secondary | ICD-10-CM | POA: Diagnosis not present

## 2018-12-03 ENCOUNTER — Other Ambulatory Visit (INDEPENDENT_AMBULATORY_CARE_PROVIDER_SITE_OTHER): Payer: Self-pay | Admitting: Vascular Surgery

## 2018-12-03 DIAGNOSIS — Z992 Dependence on renal dialysis: Secondary | ICD-10-CM | POA: Diagnosis not present

## 2018-12-03 DIAGNOSIS — D509 Iron deficiency anemia, unspecified: Secondary | ICD-10-CM | POA: Diagnosis not present

## 2018-12-03 DIAGNOSIS — N2581 Secondary hyperparathyroidism of renal origin: Secondary | ICD-10-CM | POA: Diagnosis not present

## 2018-12-03 DIAGNOSIS — I739 Peripheral vascular disease, unspecified: Principal | ICD-10-CM

## 2018-12-03 DIAGNOSIS — N186 End stage renal disease: Secondary | ICD-10-CM | POA: Diagnosis not present

## 2018-12-03 DIAGNOSIS — I779 Disorder of arteries and arterioles, unspecified: Secondary | ICD-10-CM

## 2018-12-04 DIAGNOSIS — D509 Iron deficiency anemia, unspecified: Secondary | ICD-10-CM | POA: Diagnosis not present

## 2018-12-04 DIAGNOSIS — N2581 Secondary hyperparathyroidism of renal origin: Secondary | ICD-10-CM | POA: Diagnosis not present

## 2018-12-04 DIAGNOSIS — N186 End stage renal disease: Secondary | ICD-10-CM | POA: Diagnosis not present

## 2018-12-04 DIAGNOSIS — Z992 Dependence on renal dialysis: Secondary | ICD-10-CM | POA: Diagnosis not present

## 2018-12-06 DIAGNOSIS — N186 End stage renal disease: Secondary | ICD-10-CM | POA: Diagnosis not present

## 2018-12-06 DIAGNOSIS — D509 Iron deficiency anemia, unspecified: Secondary | ICD-10-CM | POA: Diagnosis not present

## 2018-12-06 DIAGNOSIS — Z992 Dependence on renal dialysis: Secondary | ICD-10-CM | POA: Diagnosis not present

## 2018-12-06 DIAGNOSIS — N2581 Secondary hyperparathyroidism of renal origin: Secondary | ICD-10-CM | POA: Diagnosis not present

## 2018-12-07 ENCOUNTER — Ambulatory Visit (INDEPENDENT_AMBULATORY_CARE_PROVIDER_SITE_OTHER): Payer: Medicare Other | Admitting: Vascular Surgery

## 2018-12-07 ENCOUNTER — Encounter (INDEPENDENT_AMBULATORY_CARE_PROVIDER_SITE_OTHER): Payer: Medicare Other

## 2018-12-07 ENCOUNTER — Encounter (INDEPENDENT_AMBULATORY_CARE_PROVIDER_SITE_OTHER): Payer: Self-pay

## 2018-12-07 DIAGNOSIS — N2581 Secondary hyperparathyroidism of renal origin: Secondary | ICD-10-CM | POA: Diagnosis not present

## 2018-12-07 DIAGNOSIS — Z992 Dependence on renal dialysis: Secondary | ICD-10-CM | POA: Diagnosis not present

## 2018-12-07 DIAGNOSIS — N186 End stage renal disease: Secondary | ICD-10-CM | POA: Diagnosis not present

## 2018-12-07 DIAGNOSIS — D509 Iron deficiency anemia, unspecified: Secondary | ICD-10-CM | POA: Diagnosis not present

## 2018-12-08 DIAGNOSIS — Z992 Dependence on renal dialysis: Secondary | ICD-10-CM | POA: Diagnosis not present

## 2018-12-08 DIAGNOSIS — D509 Iron deficiency anemia, unspecified: Secondary | ICD-10-CM | POA: Diagnosis not present

## 2018-12-08 DIAGNOSIS — N2581 Secondary hyperparathyroidism of renal origin: Secondary | ICD-10-CM | POA: Diagnosis not present

## 2018-12-08 DIAGNOSIS — N186 End stage renal disease: Secondary | ICD-10-CM | POA: Diagnosis not present

## 2018-12-10 DIAGNOSIS — D509 Iron deficiency anemia, unspecified: Secondary | ICD-10-CM | POA: Diagnosis not present

## 2018-12-10 DIAGNOSIS — Z992 Dependence on renal dialysis: Secondary | ICD-10-CM | POA: Diagnosis not present

## 2018-12-10 DIAGNOSIS — N2581 Secondary hyperparathyroidism of renal origin: Secondary | ICD-10-CM | POA: Diagnosis not present

## 2018-12-10 DIAGNOSIS — N186 End stage renal disease: Secondary | ICD-10-CM | POA: Diagnosis not present

## 2018-12-11 DIAGNOSIS — N2581 Secondary hyperparathyroidism of renal origin: Secondary | ICD-10-CM | POA: Diagnosis not present

## 2018-12-11 DIAGNOSIS — D509 Iron deficiency anemia, unspecified: Secondary | ICD-10-CM | POA: Diagnosis not present

## 2018-12-11 DIAGNOSIS — Z992 Dependence on renal dialysis: Secondary | ICD-10-CM | POA: Diagnosis not present

## 2018-12-11 DIAGNOSIS — N186 End stage renal disease: Secondary | ICD-10-CM | POA: Diagnosis not present

## 2018-12-13 DIAGNOSIS — N186 End stage renal disease: Secondary | ICD-10-CM | POA: Diagnosis not present

## 2018-12-13 DIAGNOSIS — Z992 Dependence on renal dialysis: Secondary | ICD-10-CM | POA: Diagnosis not present

## 2018-12-13 DIAGNOSIS — N2581 Secondary hyperparathyroidism of renal origin: Secondary | ICD-10-CM | POA: Diagnosis not present

## 2018-12-13 DIAGNOSIS — D509 Iron deficiency anemia, unspecified: Secondary | ICD-10-CM | POA: Diagnosis not present

## 2018-12-15 DIAGNOSIS — N2581 Secondary hyperparathyroidism of renal origin: Secondary | ICD-10-CM | POA: Diagnosis not present

## 2018-12-15 DIAGNOSIS — Z992 Dependence on renal dialysis: Secondary | ICD-10-CM | POA: Diagnosis not present

## 2018-12-15 DIAGNOSIS — N186 End stage renal disease: Secondary | ICD-10-CM | POA: Diagnosis not present

## 2018-12-15 DIAGNOSIS — D509 Iron deficiency anemia, unspecified: Secondary | ICD-10-CM | POA: Diagnosis not present

## 2018-12-17 DIAGNOSIS — Z992 Dependence on renal dialysis: Secondary | ICD-10-CM | POA: Diagnosis not present

## 2018-12-17 DIAGNOSIS — N2581 Secondary hyperparathyroidism of renal origin: Secondary | ICD-10-CM | POA: Diagnosis not present

## 2018-12-17 DIAGNOSIS — D509 Iron deficiency anemia, unspecified: Secondary | ICD-10-CM | POA: Diagnosis not present

## 2018-12-17 DIAGNOSIS — N186 End stage renal disease: Secondary | ICD-10-CM | POA: Diagnosis not present

## 2018-12-18 DIAGNOSIS — N186 End stage renal disease: Secondary | ICD-10-CM | POA: Diagnosis not present

## 2018-12-18 DIAGNOSIS — Z992 Dependence on renal dialysis: Secondary | ICD-10-CM | POA: Diagnosis not present

## 2018-12-18 DIAGNOSIS — N2581 Secondary hyperparathyroidism of renal origin: Secondary | ICD-10-CM | POA: Diagnosis not present

## 2018-12-18 DIAGNOSIS — D509 Iron deficiency anemia, unspecified: Secondary | ICD-10-CM | POA: Diagnosis not present

## 2018-12-21 DIAGNOSIS — N186 End stage renal disease: Secondary | ICD-10-CM | POA: Diagnosis not present

## 2018-12-21 DIAGNOSIS — Z992 Dependence on renal dialysis: Secondary | ICD-10-CM | POA: Diagnosis not present

## 2018-12-21 DIAGNOSIS — N2581 Secondary hyperparathyroidism of renal origin: Secondary | ICD-10-CM | POA: Diagnosis not present

## 2018-12-21 DIAGNOSIS — D509 Iron deficiency anemia, unspecified: Secondary | ICD-10-CM | POA: Diagnosis not present

## 2018-12-22 DIAGNOSIS — N2581 Secondary hyperparathyroidism of renal origin: Secondary | ICD-10-CM | POA: Diagnosis not present

## 2018-12-22 DIAGNOSIS — Z992 Dependence on renal dialysis: Secondary | ICD-10-CM | POA: Diagnosis not present

## 2018-12-22 DIAGNOSIS — D509 Iron deficiency anemia, unspecified: Secondary | ICD-10-CM | POA: Diagnosis not present

## 2018-12-22 DIAGNOSIS — N186 End stage renal disease: Secondary | ICD-10-CM | POA: Diagnosis not present

## 2018-12-23 DIAGNOSIS — Z992 Dependence on renal dialysis: Secondary | ICD-10-CM | POA: Diagnosis not present

## 2018-12-23 DIAGNOSIS — N186 End stage renal disease: Secondary | ICD-10-CM | POA: Diagnosis not present

## 2018-12-23 DIAGNOSIS — D509 Iron deficiency anemia, unspecified: Secondary | ICD-10-CM | POA: Diagnosis not present

## 2018-12-23 DIAGNOSIS — N2581 Secondary hyperparathyroidism of renal origin: Secondary | ICD-10-CM | POA: Diagnosis not present

## 2018-12-24 DIAGNOSIS — N186 End stage renal disease: Secondary | ICD-10-CM | POA: Diagnosis not present

## 2018-12-24 DIAGNOSIS — Z992 Dependence on renal dialysis: Secondary | ICD-10-CM | POA: Diagnosis not present

## 2018-12-24 DIAGNOSIS — D509 Iron deficiency anemia, unspecified: Secondary | ICD-10-CM | POA: Diagnosis not present

## 2018-12-24 DIAGNOSIS — N2581 Secondary hyperparathyroidism of renal origin: Secondary | ICD-10-CM | POA: Diagnosis not present

## 2018-12-25 DIAGNOSIS — D509 Iron deficiency anemia, unspecified: Secondary | ICD-10-CM | POA: Diagnosis not present

## 2018-12-25 DIAGNOSIS — Z992 Dependence on renal dialysis: Secondary | ICD-10-CM | POA: Diagnosis not present

## 2018-12-25 DIAGNOSIS — N2581 Secondary hyperparathyroidism of renal origin: Secondary | ICD-10-CM | POA: Diagnosis not present

## 2018-12-25 DIAGNOSIS — N186 End stage renal disease: Secondary | ICD-10-CM | POA: Diagnosis not present

## 2018-12-27 DIAGNOSIS — N2581 Secondary hyperparathyroidism of renal origin: Secondary | ICD-10-CM | POA: Diagnosis not present

## 2018-12-27 DIAGNOSIS — D509 Iron deficiency anemia, unspecified: Secondary | ICD-10-CM | POA: Diagnosis not present

## 2018-12-27 DIAGNOSIS — Z992 Dependence on renal dialysis: Secondary | ICD-10-CM | POA: Diagnosis not present

## 2018-12-27 DIAGNOSIS — N186 End stage renal disease: Secondary | ICD-10-CM | POA: Diagnosis not present

## 2018-12-28 DIAGNOSIS — N186 End stage renal disease: Secondary | ICD-10-CM | POA: Diagnosis not present

## 2018-12-28 DIAGNOSIS — Z992 Dependence on renal dialysis: Secondary | ICD-10-CM | POA: Diagnosis not present

## 2018-12-28 DIAGNOSIS — N2581 Secondary hyperparathyroidism of renal origin: Secondary | ICD-10-CM | POA: Diagnosis not present

## 2018-12-28 DIAGNOSIS — D509 Iron deficiency anemia, unspecified: Secondary | ICD-10-CM | POA: Diagnosis not present

## 2018-12-29 DIAGNOSIS — D509 Iron deficiency anemia, unspecified: Secondary | ICD-10-CM | POA: Diagnosis not present

## 2018-12-29 DIAGNOSIS — Z992 Dependence on renal dialysis: Secondary | ICD-10-CM | POA: Diagnosis not present

## 2018-12-29 DIAGNOSIS — N2581 Secondary hyperparathyroidism of renal origin: Secondary | ICD-10-CM | POA: Diagnosis not present

## 2018-12-29 DIAGNOSIS — N186 End stage renal disease: Secondary | ICD-10-CM | POA: Diagnosis not present

## 2018-12-31 DIAGNOSIS — N2581 Secondary hyperparathyroidism of renal origin: Secondary | ICD-10-CM | POA: Diagnosis not present

## 2018-12-31 DIAGNOSIS — N186 End stage renal disease: Secondary | ICD-10-CM | POA: Diagnosis not present

## 2018-12-31 DIAGNOSIS — Z992 Dependence on renal dialysis: Secondary | ICD-10-CM | POA: Diagnosis not present

## 2018-12-31 DIAGNOSIS — D509 Iron deficiency anemia, unspecified: Secondary | ICD-10-CM | POA: Diagnosis not present

## 2019-01-04 DIAGNOSIS — N186 End stage renal disease: Secondary | ICD-10-CM | POA: Diagnosis not present

## 2019-01-04 DIAGNOSIS — N2581 Secondary hyperparathyroidism of renal origin: Secondary | ICD-10-CM | POA: Diagnosis not present

## 2019-01-04 DIAGNOSIS — D509 Iron deficiency anemia, unspecified: Secondary | ICD-10-CM | POA: Diagnosis not present

## 2019-01-04 DIAGNOSIS — Z992 Dependence on renal dialysis: Secondary | ICD-10-CM | POA: Diagnosis not present

## 2019-01-05 DIAGNOSIS — N186 End stage renal disease: Secondary | ICD-10-CM | POA: Diagnosis not present

## 2019-01-05 DIAGNOSIS — N2581 Secondary hyperparathyroidism of renal origin: Secondary | ICD-10-CM | POA: Diagnosis not present

## 2019-01-05 DIAGNOSIS — D509 Iron deficiency anemia, unspecified: Secondary | ICD-10-CM | POA: Diagnosis not present

## 2019-01-05 DIAGNOSIS — Z992 Dependence on renal dialysis: Secondary | ICD-10-CM | POA: Diagnosis not present

## 2019-01-07 DIAGNOSIS — D509 Iron deficiency anemia, unspecified: Secondary | ICD-10-CM | POA: Diagnosis not present

## 2019-01-07 DIAGNOSIS — N186 End stage renal disease: Secondary | ICD-10-CM | POA: Diagnosis not present

## 2019-01-07 DIAGNOSIS — Z992 Dependence on renal dialysis: Secondary | ICD-10-CM | POA: Diagnosis not present

## 2019-01-07 DIAGNOSIS — N2581 Secondary hyperparathyroidism of renal origin: Secondary | ICD-10-CM | POA: Diagnosis not present

## 2019-01-08 DIAGNOSIS — N2581 Secondary hyperparathyroidism of renal origin: Secondary | ICD-10-CM | POA: Diagnosis not present

## 2019-01-08 DIAGNOSIS — N186 End stage renal disease: Secondary | ICD-10-CM | POA: Diagnosis not present

## 2019-01-08 DIAGNOSIS — Z992 Dependence on renal dialysis: Secondary | ICD-10-CM | POA: Diagnosis not present

## 2019-01-08 DIAGNOSIS — D509 Iron deficiency anemia, unspecified: Secondary | ICD-10-CM | POA: Diagnosis not present

## 2019-01-10 DIAGNOSIS — N2581 Secondary hyperparathyroidism of renal origin: Secondary | ICD-10-CM | POA: Diagnosis not present

## 2019-01-10 DIAGNOSIS — N186 End stage renal disease: Secondary | ICD-10-CM | POA: Diagnosis not present

## 2019-01-10 DIAGNOSIS — D509 Iron deficiency anemia, unspecified: Secondary | ICD-10-CM | POA: Diagnosis not present

## 2019-01-10 DIAGNOSIS — Z992 Dependence on renal dialysis: Secondary | ICD-10-CM | POA: Diagnosis not present

## 2019-01-11 DIAGNOSIS — N186 End stage renal disease: Secondary | ICD-10-CM | POA: Diagnosis not present

## 2019-01-11 DIAGNOSIS — N2581 Secondary hyperparathyroidism of renal origin: Secondary | ICD-10-CM | POA: Diagnosis not present

## 2019-01-11 DIAGNOSIS — Z992 Dependence on renal dialysis: Secondary | ICD-10-CM | POA: Diagnosis not present

## 2019-01-11 DIAGNOSIS — D509 Iron deficiency anemia, unspecified: Secondary | ICD-10-CM | POA: Diagnosis not present

## 2019-01-13 DIAGNOSIS — D509 Iron deficiency anemia, unspecified: Secondary | ICD-10-CM | POA: Diagnosis not present

## 2019-01-13 DIAGNOSIS — Z992 Dependence on renal dialysis: Secondary | ICD-10-CM | POA: Diagnosis not present

## 2019-01-13 DIAGNOSIS — N2581 Secondary hyperparathyroidism of renal origin: Secondary | ICD-10-CM | POA: Diagnosis not present

## 2019-01-13 DIAGNOSIS — N186 End stage renal disease: Secondary | ICD-10-CM | POA: Diagnosis not present

## 2019-01-14 DIAGNOSIS — Z992 Dependence on renal dialysis: Secondary | ICD-10-CM | POA: Diagnosis not present

## 2019-01-14 DIAGNOSIS — N2581 Secondary hyperparathyroidism of renal origin: Secondary | ICD-10-CM | POA: Diagnosis not present

## 2019-01-14 DIAGNOSIS — N186 End stage renal disease: Secondary | ICD-10-CM | POA: Diagnosis not present

## 2019-01-14 DIAGNOSIS — D509 Iron deficiency anemia, unspecified: Secondary | ICD-10-CM | POA: Diagnosis not present

## 2019-01-15 DIAGNOSIS — D509 Iron deficiency anemia, unspecified: Secondary | ICD-10-CM | POA: Diagnosis not present

## 2019-01-15 DIAGNOSIS — N2581 Secondary hyperparathyroidism of renal origin: Secondary | ICD-10-CM | POA: Diagnosis not present

## 2019-01-15 DIAGNOSIS — Z992 Dependence on renal dialysis: Secondary | ICD-10-CM | POA: Diagnosis not present

## 2019-01-15 DIAGNOSIS — N186 End stage renal disease: Secondary | ICD-10-CM | POA: Diagnosis not present

## 2019-01-17 DIAGNOSIS — Z992 Dependence on renal dialysis: Secondary | ICD-10-CM | POA: Diagnosis not present

## 2019-01-17 DIAGNOSIS — D509 Iron deficiency anemia, unspecified: Secondary | ICD-10-CM | POA: Diagnosis not present

## 2019-01-17 DIAGNOSIS — N186 End stage renal disease: Secondary | ICD-10-CM | POA: Diagnosis not present

## 2019-01-17 DIAGNOSIS — N2581 Secondary hyperparathyroidism of renal origin: Secondary | ICD-10-CM | POA: Diagnosis not present

## 2019-01-18 DIAGNOSIS — D509 Iron deficiency anemia, unspecified: Secondary | ICD-10-CM | POA: Diagnosis not present

## 2019-01-18 DIAGNOSIS — Z992 Dependence on renal dialysis: Secondary | ICD-10-CM | POA: Diagnosis not present

## 2019-01-18 DIAGNOSIS — N2581 Secondary hyperparathyroidism of renal origin: Secondary | ICD-10-CM | POA: Diagnosis not present

## 2019-01-18 DIAGNOSIS — N186 End stage renal disease: Secondary | ICD-10-CM | POA: Diagnosis not present

## 2019-01-19 DIAGNOSIS — Z992 Dependence on renal dialysis: Secondary | ICD-10-CM | POA: Diagnosis not present

## 2019-01-19 DIAGNOSIS — N186 End stage renal disease: Secondary | ICD-10-CM | POA: Diagnosis not present

## 2019-01-19 DIAGNOSIS — N2581 Secondary hyperparathyroidism of renal origin: Secondary | ICD-10-CM | POA: Diagnosis not present

## 2019-01-19 DIAGNOSIS — D509 Iron deficiency anemia, unspecified: Secondary | ICD-10-CM | POA: Diagnosis not present

## 2019-01-20 DIAGNOSIS — D509 Iron deficiency anemia, unspecified: Secondary | ICD-10-CM | POA: Diagnosis not present

## 2019-01-20 DIAGNOSIS — N186 End stage renal disease: Secondary | ICD-10-CM | POA: Diagnosis not present

## 2019-01-20 DIAGNOSIS — E119 Type 2 diabetes mellitus without complications: Secondary | ICD-10-CM | POA: Diagnosis not present

## 2019-01-20 DIAGNOSIS — Z992 Dependence on renal dialysis: Secondary | ICD-10-CM | POA: Diagnosis not present

## 2019-01-20 DIAGNOSIS — N2581 Secondary hyperparathyroidism of renal origin: Secondary | ICD-10-CM | POA: Diagnosis not present

## 2019-01-22 DIAGNOSIS — Z992 Dependence on renal dialysis: Secondary | ICD-10-CM | POA: Diagnosis not present

## 2019-01-22 DIAGNOSIS — N2581 Secondary hyperparathyroidism of renal origin: Secondary | ICD-10-CM | POA: Diagnosis not present

## 2019-01-22 DIAGNOSIS — N186 End stage renal disease: Secondary | ICD-10-CM | POA: Diagnosis not present

## 2019-01-22 DIAGNOSIS — D509 Iron deficiency anemia, unspecified: Secondary | ICD-10-CM | POA: Diagnosis not present

## 2019-01-24 DIAGNOSIS — Z992 Dependence on renal dialysis: Secondary | ICD-10-CM | POA: Diagnosis not present

## 2019-01-24 DIAGNOSIS — N186 End stage renal disease: Secondary | ICD-10-CM | POA: Diagnosis not present

## 2019-01-24 DIAGNOSIS — D509 Iron deficiency anemia, unspecified: Secondary | ICD-10-CM | POA: Diagnosis not present

## 2019-01-24 DIAGNOSIS — N2581 Secondary hyperparathyroidism of renal origin: Secondary | ICD-10-CM | POA: Diagnosis not present

## 2019-01-26 DIAGNOSIS — D509 Iron deficiency anemia, unspecified: Secondary | ICD-10-CM | POA: Diagnosis not present

## 2019-01-26 DIAGNOSIS — N186 End stage renal disease: Secondary | ICD-10-CM | POA: Diagnosis not present

## 2019-01-26 DIAGNOSIS — Z992 Dependence on renal dialysis: Secondary | ICD-10-CM | POA: Diagnosis not present

## 2019-01-26 DIAGNOSIS — N2581 Secondary hyperparathyroidism of renal origin: Secondary | ICD-10-CM | POA: Diagnosis not present

## 2019-01-27 DIAGNOSIS — I5022 Chronic systolic (congestive) heart failure: Secondary | ICD-10-CM | POA: Diagnosis not present

## 2019-01-27 DIAGNOSIS — R0602 Shortness of breath: Secondary | ICD-10-CM | POA: Diagnosis not present

## 2019-01-27 DIAGNOSIS — I10 Essential (primary) hypertension: Secondary | ICD-10-CM | POA: Diagnosis not present

## 2019-01-27 DIAGNOSIS — Z95810 Presence of automatic (implantable) cardiac defibrillator: Secondary | ICD-10-CM | POA: Diagnosis not present

## 2019-01-27 DIAGNOSIS — I2510 Atherosclerotic heart disease of native coronary artery without angina pectoris: Secondary | ICD-10-CM | POA: Diagnosis not present

## 2019-01-27 DIAGNOSIS — I493 Ventricular premature depolarization: Secondary | ICD-10-CM | POA: Diagnosis not present

## 2019-01-27 DIAGNOSIS — I480 Paroxysmal atrial fibrillation: Secondary | ICD-10-CM | POA: Diagnosis not present

## 2019-01-27 DIAGNOSIS — I429 Cardiomyopathy, unspecified: Secondary | ICD-10-CM | POA: Diagnosis not present

## 2019-01-27 DIAGNOSIS — Z955 Presence of coronary angioplasty implant and graft: Secondary | ICD-10-CM | POA: Diagnosis not present

## 2019-01-27 DIAGNOSIS — I255 Ischemic cardiomyopathy: Secondary | ICD-10-CM | POA: Diagnosis not present

## 2019-01-27 DIAGNOSIS — E7800 Pure hypercholesterolemia, unspecified: Secondary | ICD-10-CM | POA: Diagnosis not present

## 2019-01-28 DIAGNOSIS — N2581 Secondary hyperparathyroidism of renal origin: Secondary | ICD-10-CM | POA: Diagnosis not present

## 2019-01-28 DIAGNOSIS — N186 End stage renal disease: Secondary | ICD-10-CM | POA: Diagnosis not present

## 2019-01-28 DIAGNOSIS — D509 Iron deficiency anemia, unspecified: Secondary | ICD-10-CM | POA: Diagnosis not present

## 2019-01-28 DIAGNOSIS — Z992 Dependence on renal dialysis: Secondary | ICD-10-CM | POA: Diagnosis not present

## 2019-01-31 DIAGNOSIS — D509 Iron deficiency anemia, unspecified: Secondary | ICD-10-CM | POA: Diagnosis not present

## 2019-01-31 DIAGNOSIS — N186 End stage renal disease: Secondary | ICD-10-CM | POA: Diagnosis not present

## 2019-01-31 DIAGNOSIS — N2581 Secondary hyperparathyroidism of renal origin: Secondary | ICD-10-CM | POA: Diagnosis not present

## 2019-01-31 DIAGNOSIS — Z992 Dependence on renal dialysis: Secondary | ICD-10-CM | POA: Diagnosis not present

## 2019-02-01 DIAGNOSIS — Z992 Dependence on renal dialysis: Secondary | ICD-10-CM | POA: Diagnosis not present

## 2019-02-01 DIAGNOSIS — N186 End stage renal disease: Secondary | ICD-10-CM | POA: Diagnosis not present

## 2019-02-01 DIAGNOSIS — N2581 Secondary hyperparathyroidism of renal origin: Secondary | ICD-10-CM | POA: Diagnosis not present

## 2019-02-01 DIAGNOSIS — D509 Iron deficiency anemia, unspecified: Secondary | ICD-10-CM | POA: Diagnosis not present

## 2019-02-03 DIAGNOSIS — D509 Iron deficiency anemia, unspecified: Secondary | ICD-10-CM | POA: Diagnosis not present

## 2019-02-03 DIAGNOSIS — Z992 Dependence on renal dialysis: Secondary | ICD-10-CM | POA: Diagnosis not present

## 2019-02-03 DIAGNOSIS — N186 End stage renal disease: Secondary | ICD-10-CM | POA: Diagnosis not present

## 2019-02-03 DIAGNOSIS — N2581 Secondary hyperparathyroidism of renal origin: Secondary | ICD-10-CM | POA: Diagnosis not present

## 2019-02-05 DIAGNOSIS — D509 Iron deficiency anemia, unspecified: Secondary | ICD-10-CM | POA: Diagnosis not present

## 2019-02-05 DIAGNOSIS — Z992 Dependence on renal dialysis: Secondary | ICD-10-CM | POA: Diagnosis not present

## 2019-02-05 DIAGNOSIS — N186 End stage renal disease: Secondary | ICD-10-CM | POA: Diagnosis not present

## 2019-02-05 DIAGNOSIS — N2581 Secondary hyperparathyroidism of renal origin: Secondary | ICD-10-CM | POA: Diagnosis not present

## 2019-02-07 DIAGNOSIS — Z992 Dependence on renal dialysis: Secondary | ICD-10-CM | POA: Diagnosis not present

## 2019-02-07 DIAGNOSIS — N2581 Secondary hyperparathyroidism of renal origin: Secondary | ICD-10-CM | POA: Diagnosis not present

## 2019-02-07 DIAGNOSIS — D509 Iron deficiency anemia, unspecified: Secondary | ICD-10-CM | POA: Diagnosis not present

## 2019-02-07 DIAGNOSIS — N186 End stage renal disease: Secondary | ICD-10-CM | POA: Diagnosis not present

## 2019-02-08 DIAGNOSIS — N186 End stage renal disease: Secondary | ICD-10-CM | POA: Diagnosis not present

## 2019-02-08 DIAGNOSIS — D509 Iron deficiency anemia, unspecified: Secondary | ICD-10-CM | POA: Diagnosis not present

## 2019-02-08 DIAGNOSIS — Z992 Dependence on renal dialysis: Secondary | ICD-10-CM | POA: Diagnosis not present

## 2019-02-08 DIAGNOSIS — N2581 Secondary hyperparathyroidism of renal origin: Secondary | ICD-10-CM | POA: Diagnosis not present

## 2019-02-09 DIAGNOSIS — N186 End stage renal disease: Secondary | ICD-10-CM | POA: Diagnosis not present

## 2019-02-09 DIAGNOSIS — Z992 Dependence on renal dialysis: Secondary | ICD-10-CM | POA: Diagnosis not present

## 2019-02-09 DIAGNOSIS — N2581 Secondary hyperparathyroidism of renal origin: Secondary | ICD-10-CM | POA: Diagnosis not present

## 2019-02-09 DIAGNOSIS — D509 Iron deficiency anemia, unspecified: Secondary | ICD-10-CM | POA: Diagnosis not present

## 2019-02-11 DIAGNOSIS — N186 End stage renal disease: Secondary | ICD-10-CM | POA: Diagnosis not present

## 2019-02-11 DIAGNOSIS — N2581 Secondary hyperparathyroidism of renal origin: Secondary | ICD-10-CM | POA: Diagnosis not present

## 2019-02-11 DIAGNOSIS — D509 Iron deficiency anemia, unspecified: Secondary | ICD-10-CM | POA: Diagnosis not present

## 2019-02-11 DIAGNOSIS — Z992 Dependence on renal dialysis: Secondary | ICD-10-CM | POA: Diagnosis not present

## 2019-02-14 DIAGNOSIS — Z992 Dependence on renal dialysis: Secondary | ICD-10-CM | POA: Diagnosis not present

## 2019-02-14 DIAGNOSIS — N2581 Secondary hyperparathyroidism of renal origin: Secondary | ICD-10-CM | POA: Diagnosis not present

## 2019-02-14 DIAGNOSIS — N186 End stage renal disease: Secondary | ICD-10-CM | POA: Diagnosis not present

## 2019-02-14 DIAGNOSIS — D509 Iron deficiency anemia, unspecified: Secondary | ICD-10-CM | POA: Diagnosis not present

## 2019-02-15 DIAGNOSIS — N186 End stage renal disease: Secondary | ICD-10-CM | POA: Diagnosis not present

## 2019-02-15 DIAGNOSIS — D509 Iron deficiency anemia, unspecified: Secondary | ICD-10-CM | POA: Diagnosis not present

## 2019-02-15 DIAGNOSIS — N2581 Secondary hyperparathyroidism of renal origin: Secondary | ICD-10-CM | POA: Diagnosis not present

## 2019-02-15 DIAGNOSIS — Z992 Dependence on renal dialysis: Secondary | ICD-10-CM | POA: Diagnosis not present

## 2019-02-16 DIAGNOSIS — N186 End stage renal disease: Secondary | ICD-10-CM | POA: Diagnosis not present

## 2019-02-16 DIAGNOSIS — N2581 Secondary hyperparathyroidism of renal origin: Secondary | ICD-10-CM | POA: Diagnosis not present

## 2019-02-16 DIAGNOSIS — Z992 Dependence on renal dialysis: Secondary | ICD-10-CM | POA: Diagnosis not present

## 2019-02-16 DIAGNOSIS — D509 Iron deficiency anemia, unspecified: Secondary | ICD-10-CM | POA: Diagnosis not present

## 2019-02-17 DIAGNOSIS — N186 End stage renal disease: Secondary | ICD-10-CM | POA: Diagnosis not present

## 2019-02-17 DIAGNOSIS — Z992 Dependence on renal dialysis: Secondary | ICD-10-CM | POA: Diagnosis not present

## 2019-02-18 DIAGNOSIS — Z992 Dependence on renal dialysis: Secondary | ICD-10-CM | POA: Diagnosis not present

## 2019-02-18 DIAGNOSIS — N2581 Secondary hyperparathyroidism of renal origin: Secondary | ICD-10-CM | POA: Diagnosis not present

## 2019-02-18 DIAGNOSIS — N186 End stage renal disease: Secondary | ICD-10-CM | POA: Diagnosis not present

## 2019-02-18 DIAGNOSIS — D509 Iron deficiency anemia, unspecified: Secondary | ICD-10-CM | POA: Diagnosis not present

## 2019-02-21 DIAGNOSIS — N2581 Secondary hyperparathyroidism of renal origin: Secondary | ICD-10-CM | POA: Diagnosis not present

## 2019-02-21 DIAGNOSIS — N186 End stage renal disease: Secondary | ICD-10-CM | POA: Diagnosis not present

## 2019-02-21 DIAGNOSIS — D509 Iron deficiency anemia, unspecified: Secondary | ICD-10-CM | POA: Diagnosis not present

## 2019-02-21 DIAGNOSIS — Z992 Dependence on renal dialysis: Secondary | ICD-10-CM | POA: Diagnosis not present

## 2019-02-22 DIAGNOSIS — Z992 Dependence on renal dialysis: Secondary | ICD-10-CM | POA: Diagnosis not present

## 2019-02-22 DIAGNOSIS — N186 End stage renal disease: Secondary | ICD-10-CM | POA: Diagnosis not present

## 2019-02-22 DIAGNOSIS — D509 Iron deficiency anemia, unspecified: Secondary | ICD-10-CM | POA: Diagnosis not present

## 2019-02-22 DIAGNOSIS — N2581 Secondary hyperparathyroidism of renal origin: Secondary | ICD-10-CM | POA: Diagnosis not present

## 2019-02-23 DIAGNOSIS — D509 Iron deficiency anemia, unspecified: Secondary | ICD-10-CM | POA: Diagnosis not present

## 2019-02-23 DIAGNOSIS — Z992 Dependence on renal dialysis: Secondary | ICD-10-CM | POA: Diagnosis not present

## 2019-02-23 DIAGNOSIS — N186 End stage renal disease: Secondary | ICD-10-CM | POA: Diagnosis not present

## 2019-02-23 DIAGNOSIS — N2581 Secondary hyperparathyroidism of renal origin: Secondary | ICD-10-CM | POA: Diagnosis not present

## 2019-02-24 DIAGNOSIS — Z992 Dependence on renal dialysis: Secondary | ICD-10-CM | POA: Diagnosis not present

## 2019-02-24 DIAGNOSIS — N2581 Secondary hyperparathyroidism of renal origin: Secondary | ICD-10-CM | POA: Diagnosis not present

## 2019-02-24 DIAGNOSIS — D509 Iron deficiency anemia, unspecified: Secondary | ICD-10-CM | POA: Diagnosis not present

## 2019-02-24 DIAGNOSIS — N186 End stage renal disease: Secondary | ICD-10-CM | POA: Diagnosis not present

## 2019-02-25 DIAGNOSIS — N186 End stage renal disease: Secondary | ICD-10-CM | POA: Diagnosis not present

## 2019-02-25 DIAGNOSIS — D509 Iron deficiency anemia, unspecified: Secondary | ICD-10-CM | POA: Diagnosis not present

## 2019-02-25 DIAGNOSIS — N2581 Secondary hyperparathyroidism of renal origin: Secondary | ICD-10-CM | POA: Diagnosis not present

## 2019-02-25 DIAGNOSIS — Z992 Dependence on renal dialysis: Secondary | ICD-10-CM | POA: Diagnosis not present

## 2019-02-26 DIAGNOSIS — Z992 Dependence on renal dialysis: Secondary | ICD-10-CM | POA: Diagnosis not present

## 2019-02-26 DIAGNOSIS — N2581 Secondary hyperparathyroidism of renal origin: Secondary | ICD-10-CM | POA: Diagnosis not present

## 2019-02-26 DIAGNOSIS — D509 Iron deficiency anemia, unspecified: Secondary | ICD-10-CM | POA: Diagnosis not present

## 2019-02-26 DIAGNOSIS — N186 End stage renal disease: Secondary | ICD-10-CM | POA: Diagnosis not present

## 2019-02-28 DIAGNOSIS — N2581 Secondary hyperparathyroidism of renal origin: Secondary | ICD-10-CM | POA: Diagnosis not present

## 2019-02-28 DIAGNOSIS — Z992 Dependence on renal dialysis: Secondary | ICD-10-CM | POA: Diagnosis not present

## 2019-02-28 DIAGNOSIS — N186 End stage renal disease: Secondary | ICD-10-CM | POA: Diagnosis not present

## 2019-02-28 DIAGNOSIS — D509 Iron deficiency anemia, unspecified: Secondary | ICD-10-CM | POA: Diagnosis not present

## 2019-03-01 DIAGNOSIS — Z992 Dependence on renal dialysis: Secondary | ICD-10-CM | POA: Diagnosis not present

## 2019-03-01 DIAGNOSIS — N2581 Secondary hyperparathyroidism of renal origin: Secondary | ICD-10-CM | POA: Diagnosis not present

## 2019-03-01 DIAGNOSIS — N186 End stage renal disease: Secondary | ICD-10-CM | POA: Diagnosis not present

## 2019-03-01 DIAGNOSIS — D509 Iron deficiency anemia, unspecified: Secondary | ICD-10-CM | POA: Diagnosis not present

## 2019-03-03 DIAGNOSIS — D509 Iron deficiency anemia, unspecified: Secondary | ICD-10-CM | POA: Diagnosis not present

## 2019-03-03 DIAGNOSIS — N186 End stage renal disease: Secondary | ICD-10-CM | POA: Diagnosis not present

## 2019-03-03 DIAGNOSIS — N2581 Secondary hyperparathyroidism of renal origin: Secondary | ICD-10-CM | POA: Diagnosis not present

## 2019-03-03 DIAGNOSIS — Z992 Dependence on renal dialysis: Secondary | ICD-10-CM | POA: Diagnosis not present

## 2019-03-05 DIAGNOSIS — N2581 Secondary hyperparathyroidism of renal origin: Secondary | ICD-10-CM | POA: Diagnosis not present

## 2019-03-05 DIAGNOSIS — N186 End stage renal disease: Secondary | ICD-10-CM | POA: Diagnosis not present

## 2019-03-05 DIAGNOSIS — D509 Iron deficiency anemia, unspecified: Secondary | ICD-10-CM | POA: Diagnosis not present

## 2019-03-05 DIAGNOSIS — Z992 Dependence on renal dialysis: Secondary | ICD-10-CM | POA: Diagnosis not present

## 2019-03-07 DIAGNOSIS — N186 End stage renal disease: Secondary | ICD-10-CM | POA: Diagnosis not present

## 2019-03-07 DIAGNOSIS — Z992 Dependence on renal dialysis: Secondary | ICD-10-CM | POA: Diagnosis not present

## 2019-03-07 DIAGNOSIS — D509 Iron deficiency anemia, unspecified: Secondary | ICD-10-CM | POA: Diagnosis not present

## 2019-03-07 DIAGNOSIS — N2581 Secondary hyperparathyroidism of renal origin: Secondary | ICD-10-CM | POA: Diagnosis not present

## 2019-03-08 DIAGNOSIS — D509 Iron deficiency anemia, unspecified: Secondary | ICD-10-CM | POA: Diagnosis not present

## 2019-03-08 DIAGNOSIS — Z992 Dependence on renal dialysis: Secondary | ICD-10-CM | POA: Diagnosis not present

## 2019-03-08 DIAGNOSIS — N186 End stage renal disease: Secondary | ICD-10-CM | POA: Diagnosis not present

## 2019-03-08 DIAGNOSIS — N2581 Secondary hyperparathyroidism of renal origin: Secondary | ICD-10-CM | POA: Diagnosis not present

## 2019-03-09 DIAGNOSIS — N186 End stage renal disease: Secondary | ICD-10-CM | POA: Diagnosis not present

## 2019-03-09 DIAGNOSIS — H35372 Puckering of macula, left eye: Secondary | ICD-10-CM | POA: Diagnosis not present

## 2019-03-09 DIAGNOSIS — N2581 Secondary hyperparathyroidism of renal origin: Secondary | ICD-10-CM | POA: Diagnosis not present

## 2019-03-09 DIAGNOSIS — D509 Iron deficiency anemia, unspecified: Secondary | ICD-10-CM | POA: Diagnosis not present

## 2019-03-09 DIAGNOSIS — Z992 Dependence on renal dialysis: Secondary | ICD-10-CM | POA: Diagnosis not present

## 2019-03-10 DIAGNOSIS — N186 End stage renal disease: Secondary | ICD-10-CM | POA: Diagnosis not present

## 2019-03-10 DIAGNOSIS — Z992 Dependence on renal dialysis: Secondary | ICD-10-CM | POA: Diagnosis not present

## 2019-03-10 DIAGNOSIS — D509 Iron deficiency anemia, unspecified: Secondary | ICD-10-CM | POA: Diagnosis not present

## 2019-03-10 DIAGNOSIS — N2581 Secondary hyperparathyroidism of renal origin: Secondary | ICD-10-CM | POA: Diagnosis not present

## 2019-03-12 DIAGNOSIS — N2581 Secondary hyperparathyroidism of renal origin: Secondary | ICD-10-CM | POA: Diagnosis not present

## 2019-03-12 DIAGNOSIS — Z992 Dependence on renal dialysis: Secondary | ICD-10-CM | POA: Diagnosis not present

## 2019-03-12 DIAGNOSIS — D509 Iron deficiency anemia, unspecified: Secondary | ICD-10-CM | POA: Diagnosis not present

## 2019-03-12 DIAGNOSIS — N186 End stage renal disease: Secondary | ICD-10-CM | POA: Diagnosis not present

## 2019-03-14 DIAGNOSIS — Z992 Dependence on renal dialysis: Secondary | ICD-10-CM | POA: Diagnosis not present

## 2019-03-14 DIAGNOSIS — N186 End stage renal disease: Secondary | ICD-10-CM | POA: Diagnosis not present

## 2019-03-14 DIAGNOSIS — D509 Iron deficiency anemia, unspecified: Secondary | ICD-10-CM | POA: Diagnosis not present

## 2019-03-14 DIAGNOSIS — N2581 Secondary hyperparathyroidism of renal origin: Secondary | ICD-10-CM | POA: Diagnosis not present

## 2019-03-16 ENCOUNTER — Ambulatory Visit
Admission: RE | Admit: 2019-03-16 | Discharge: 2019-03-16 | Disposition: A | Discharge status: Home / Self Care | Payer: Medicare Other | Source: Ambulatory Visit | Attending: Internal Medicine | Admitting: Internal Medicine

## 2019-03-16 ENCOUNTER — Other Ambulatory Visit: Payer: Self-pay

## 2019-03-16 ENCOUNTER — Other Ambulatory Visit: Payer: Self-pay | Admitting: Internal Medicine

## 2019-03-16 DIAGNOSIS — M542 Cervicalgia: Secondary | ICD-10-CM | POA: Diagnosis not present

## 2019-03-16 DIAGNOSIS — R0789 Other chest pain: Secondary | ICD-10-CM | POA: Insufficient documentation

## 2019-03-16 DIAGNOSIS — S199XXA Unspecified injury of neck, initial encounter: Secondary | ICD-10-CM | POA: Diagnosis not present

## 2019-03-16 DIAGNOSIS — S299XXA Unspecified injury of thorax, initial encounter: Secondary | ICD-10-CM | POA: Diagnosis not present

## 2019-03-17 DIAGNOSIS — N2581 Secondary hyperparathyroidism of renal origin: Secondary | ICD-10-CM | POA: Diagnosis not present

## 2019-03-17 DIAGNOSIS — N186 End stage renal disease: Secondary | ICD-10-CM | POA: Diagnosis not present

## 2019-03-17 DIAGNOSIS — Z992 Dependence on renal dialysis: Secondary | ICD-10-CM | POA: Diagnosis not present

## 2019-03-17 DIAGNOSIS — D509 Iron deficiency anemia, unspecified: Secondary | ICD-10-CM | POA: Diagnosis not present

## 2019-03-20 DIAGNOSIS — N186 End stage renal disease: Secondary | ICD-10-CM | POA: Diagnosis not present

## 2019-03-20 DIAGNOSIS — Z992 Dependence on renal dialysis: Secondary | ICD-10-CM | POA: Diagnosis not present

## 2019-03-22 DIAGNOSIS — N2581 Secondary hyperparathyroidism of renal origin: Secondary | ICD-10-CM | POA: Diagnosis not present

## 2019-03-22 DIAGNOSIS — Z992 Dependence on renal dialysis: Secondary | ICD-10-CM | POA: Diagnosis not present

## 2019-03-22 DIAGNOSIS — D509 Iron deficiency anemia, unspecified: Secondary | ICD-10-CM | POA: Diagnosis not present

## 2019-03-22 DIAGNOSIS — N186 End stage renal disease: Secondary | ICD-10-CM | POA: Diagnosis not present

## 2019-03-24 DIAGNOSIS — D509 Iron deficiency anemia, unspecified: Secondary | ICD-10-CM | POA: Diagnosis not present

## 2019-03-24 DIAGNOSIS — Z992 Dependence on renal dialysis: Secondary | ICD-10-CM | POA: Diagnosis not present

## 2019-03-24 DIAGNOSIS — N2581 Secondary hyperparathyroidism of renal origin: Secondary | ICD-10-CM | POA: Diagnosis not present

## 2019-03-24 DIAGNOSIS — N186 End stage renal disease: Secondary | ICD-10-CM | POA: Diagnosis not present

## 2019-03-25 DIAGNOSIS — D509 Iron deficiency anemia, unspecified: Secondary | ICD-10-CM | POA: Diagnosis not present

## 2019-03-25 DIAGNOSIS — Z992 Dependence on renal dialysis: Secondary | ICD-10-CM | POA: Diagnosis not present

## 2019-03-25 DIAGNOSIS — N2581 Secondary hyperparathyroidism of renal origin: Secondary | ICD-10-CM | POA: Diagnosis not present

## 2019-03-25 DIAGNOSIS — N186 End stage renal disease: Secondary | ICD-10-CM | POA: Diagnosis not present

## 2019-03-28 DIAGNOSIS — N2581 Secondary hyperparathyroidism of renal origin: Secondary | ICD-10-CM | POA: Diagnosis not present

## 2019-03-28 DIAGNOSIS — Z992 Dependence on renal dialysis: Secondary | ICD-10-CM | POA: Diagnosis not present

## 2019-03-28 DIAGNOSIS — D509 Iron deficiency anemia, unspecified: Secondary | ICD-10-CM | POA: Diagnosis not present

## 2019-03-28 DIAGNOSIS — N186 End stage renal disease: Secondary | ICD-10-CM | POA: Diagnosis not present

## 2019-03-29 DIAGNOSIS — N2581 Secondary hyperparathyroidism of renal origin: Secondary | ICD-10-CM | POA: Diagnosis not present

## 2019-03-29 DIAGNOSIS — N186 End stage renal disease: Secondary | ICD-10-CM | POA: Diagnosis not present

## 2019-03-29 DIAGNOSIS — D509 Iron deficiency anemia, unspecified: Secondary | ICD-10-CM | POA: Diagnosis not present

## 2019-03-29 DIAGNOSIS — Z992 Dependence on renal dialysis: Secondary | ICD-10-CM | POA: Diagnosis not present

## 2019-03-30 DIAGNOSIS — N2581 Secondary hyperparathyroidism of renal origin: Secondary | ICD-10-CM | POA: Diagnosis not present

## 2019-03-30 DIAGNOSIS — N186 End stage renal disease: Secondary | ICD-10-CM | POA: Diagnosis not present

## 2019-03-30 DIAGNOSIS — Z992 Dependence on renal dialysis: Secondary | ICD-10-CM | POA: Diagnosis not present

## 2019-03-30 DIAGNOSIS — D509 Iron deficiency anemia, unspecified: Secondary | ICD-10-CM | POA: Diagnosis not present

## 2019-03-31 DIAGNOSIS — D509 Iron deficiency anemia, unspecified: Secondary | ICD-10-CM | POA: Diagnosis not present

## 2019-03-31 DIAGNOSIS — N2581 Secondary hyperparathyroidism of renal origin: Secondary | ICD-10-CM | POA: Diagnosis not present

## 2019-03-31 DIAGNOSIS — Z992 Dependence on renal dialysis: Secondary | ICD-10-CM | POA: Diagnosis not present

## 2019-03-31 DIAGNOSIS — N186 End stage renal disease: Secondary | ICD-10-CM | POA: Diagnosis not present

## 2019-04-01 DIAGNOSIS — Z992 Dependence on renal dialysis: Secondary | ICD-10-CM | POA: Diagnosis not present

## 2019-04-01 DIAGNOSIS — N2581 Secondary hyperparathyroidism of renal origin: Secondary | ICD-10-CM | POA: Diagnosis not present

## 2019-04-01 DIAGNOSIS — N186 End stage renal disease: Secondary | ICD-10-CM | POA: Diagnosis not present

## 2019-04-01 DIAGNOSIS — D509 Iron deficiency anemia, unspecified: Secondary | ICD-10-CM | POA: Diagnosis not present

## 2019-04-02 DIAGNOSIS — N2581 Secondary hyperparathyroidism of renal origin: Secondary | ICD-10-CM | POA: Diagnosis not present

## 2019-04-02 DIAGNOSIS — D509 Iron deficiency anemia, unspecified: Secondary | ICD-10-CM | POA: Diagnosis not present

## 2019-04-02 DIAGNOSIS — Z992 Dependence on renal dialysis: Secondary | ICD-10-CM | POA: Diagnosis not present

## 2019-04-02 DIAGNOSIS — N186 End stage renal disease: Secondary | ICD-10-CM | POA: Diagnosis not present

## 2019-04-04 DIAGNOSIS — D509 Iron deficiency anemia, unspecified: Secondary | ICD-10-CM | POA: Diagnosis not present

## 2019-04-04 DIAGNOSIS — Z992 Dependence on renal dialysis: Secondary | ICD-10-CM | POA: Diagnosis not present

## 2019-04-04 DIAGNOSIS — N2581 Secondary hyperparathyroidism of renal origin: Secondary | ICD-10-CM | POA: Diagnosis not present

## 2019-04-04 DIAGNOSIS — N186 End stage renal disease: Secondary | ICD-10-CM | POA: Diagnosis not present

## 2019-04-05 DIAGNOSIS — N2581 Secondary hyperparathyroidism of renal origin: Secondary | ICD-10-CM | POA: Diagnosis not present

## 2019-04-05 DIAGNOSIS — N186 End stage renal disease: Secondary | ICD-10-CM | POA: Diagnosis not present

## 2019-04-05 DIAGNOSIS — Z992 Dependence on renal dialysis: Secondary | ICD-10-CM | POA: Diagnosis not present

## 2019-04-05 DIAGNOSIS — D509 Iron deficiency anemia, unspecified: Secondary | ICD-10-CM | POA: Diagnosis not present

## 2019-04-07 DIAGNOSIS — N2581 Secondary hyperparathyroidism of renal origin: Secondary | ICD-10-CM | POA: Diagnosis not present

## 2019-04-07 DIAGNOSIS — Z992 Dependence on renal dialysis: Secondary | ICD-10-CM | POA: Diagnosis not present

## 2019-04-07 DIAGNOSIS — N186 End stage renal disease: Secondary | ICD-10-CM | POA: Diagnosis not present

## 2019-04-07 DIAGNOSIS — D509 Iron deficiency anemia, unspecified: Secondary | ICD-10-CM | POA: Diagnosis not present

## 2019-04-12 DIAGNOSIS — D509 Iron deficiency anemia, unspecified: Secondary | ICD-10-CM | POA: Diagnosis not present

## 2019-04-12 DIAGNOSIS — N186 End stage renal disease: Secondary | ICD-10-CM | POA: Diagnosis not present

## 2019-04-12 DIAGNOSIS — N2581 Secondary hyperparathyroidism of renal origin: Secondary | ICD-10-CM | POA: Diagnosis not present

## 2019-04-12 DIAGNOSIS — Z992 Dependence on renal dialysis: Secondary | ICD-10-CM | POA: Diagnosis not present

## 2019-04-16 DIAGNOSIS — N2581 Secondary hyperparathyroidism of renal origin: Secondary | ICD-10-CM | POA: Diagnosis not present

## 2019-04-16 DIAGNOSIS — D509 Iron deficiency anemia, unspecified: Secondary | ICD-10-CM | POA: Diagnosis not present

## 2019-04-16 DIAGNOSIS — N186 End stage renal disease: Secondary | ICD-10-CM | POA: Diagnosis not present

## 2019-04-16 DIAGNOSIS — Z992 Dependence on renal dialysis: Secondary | ICD-10-CM | POA: Diagnosis not present

## 2019-04-18 DIAGNOSIS — Z992 Dependence on renal dialysis: Secondary | ICD-10-CM | POA: Diagnosis not present

## 2019-04-18 DIAGNOSIS — N2581 Secondary hyperparathyroidism of renal origin: Secondary | ICD-10-CM | POA: Diagnosis not present

## 2019-04-18 DIAGNOSIS — D509 Iron deficiency anemia, unspecified: Secondary | ICD-10-CM | POA: Diagnosis not present

## 2019-04-18 DIAGNOSIS — N186 End stage renal disease: Secondary | ICD-10-CM | POA: Diagnosis not present

## 2019-04-19 DIAGNOSIS — Z992 Dependence on renal dialysis: Secondary | ICD-10-CM | POA: Diagnosis not present

## 2019-04-19 DIAGNOSIS — N186 End stage renal disease: Secondary | ICD-10-CM | POA: Diagnosis not present

## 2019-04-20 DIAGNOSIS — N2581 Secondary hyperparathyroidism of renal origin: Secondary | ICD-10-CM | POA: Diagnosis not present

## 2019-04-20 DIAGNOSIS — D509 Iron deficiency anemia, unspecified: Secondary | ICD-10-CM | POA: Diagnosis not present

## 2019-04-20 DIAGNOSIS — Z992 Dependence on renal dialysis: Secondary | ICD-10-CM | POA: Diagnosis not present

## 2019-04-20 DIAGNOSIS — N186 End stage renal disease: Secondary | ICD-10-CM | POA: Diagnosis not present

## 2019-04-22 DIAGNOSIS — N186 End stage renal disease: Secondary | ICD-10-CM | POA: Diagnosis not present

## 2019-04-22 DIAGNOSIS — N2581 Secondary hyperparathyroidism of renal origin: Secondary | ICD-10-CM | POA: Diagnosis not present

## 2019-04-22 DIAGNOSIS — D509 Iron deficiency anemia, unspecified: Secondary | ICD-10-CM | POA: Diagnosis not present

## 2019-04-22 DIAGNOSIS — Z992 Dependence on renal dialysis: Secondary | ICD-10-CM | POA: Diagnosis not present

## 2019-04-23 DIAGNOSIS — D509 Iron deficiency anemia, unspecified: Secondary | ICD-10-CM | POA: Diagnosis not present

## 2019-04-23 DIAGNOSIS — N186 End stage renal disease: Secondary | ICD-10-CM | POA: Diagnosis not present

## 2019-04-23 DIAGNOSIS — Z992 Dependence on renal dialysis: Secondary | ICD-10-CM | POA: Diagnosis not present

## 2019-04-23 DIAGNOSIS — N2581 Secondary hyperparathyroidism of renal origin: Secondary | ICD-10-CM | POA: Diagnosis not present

## 2019-04-25 DIAGNOSIS — D509 Iron deficiency anemia, unspecified: Secondary | ICD-10-CM | POA: Diagnosis not present

## 2019-04-25 DIAGNOSIS — Z992 Dependence on renal dialysis: Secondary | ICD-10-CM | POA: Diagnosis not present

## 2019-04-25 DIAGNOSIS — N186 End stage renal disease: Secondary | ICD-10-CM | POA: Diagnosis not present

## 2019-04-25 DIAGNOSIS — N2581 Secondary hyperparathyroidism of renal origin: Secondary | ICD-10-CM | POA: Diagnosis not present

## 2019-04-26 DIAGNOSIS — N2581 Secondary hyperparathyroidism of renal origin: Secondary | ICD-10-CM | POA: Diagnosis not present

## 2019-04-26 DIAGNOSIS — D509 Iron deficiency anemia, unspecified: Secondary | ICD-10-CM | POA: Diagnosis not present

## 2019-04-26 DIAGNOSIS — Z992 Dependence on renal dialysis: Secondary | ICD-10-CM | POA: Diagnosis not present

## 2019-04-26 DIAGNOSIS — E119 Type 2 diabetes mellitus without complications: Secondary | ICD-10-CM | POA: Diagnosis not present

## 2019-04-26 DIAGNOSIS — N186 End stage renal disease: Secondary | ICD-10-CM | POA: Diagnosis not present

## 2019-04-27 DIAGNOSIS — N186 End stage renal disease: Secondary | ICD-10-CM | POA: Diagnosis not present

## 2019-04-27 DIAGNOSIS — Z992 Dependence on renal dialysis: Secondary | ICD-10-CM | POA: Diagnosis not present

## 2019-04-27 DIAGNOSIS — D509 Iron deficiency anemia, unspecified: Secondary | ICD-10-CM | POA: Diagnosis not present

## 2019-04-27 DIAGNOSIS — N2581 Secondary hyperparathyroidism of renal origin: Secondary | ICD-10-CM | POA: Diagnosis not present

## 2019-04-28 DIAGNOSIS — D509 Iron deficiency anemia, unspecified: Secondary | ICD-10-CM | POA: Diagnosis not present

## 2019-04-28 DIAGNOSIS — Z992 Dependence on renal dialysis: Secondary | ICD-10-CM | POA: Diagnosis not present

## 2019-04-28 DIAGNOSIS — N186 End stage renal disease: Secondary | ICD-10-CM | POA: Diagnosis not present

## 2019-04-28 DIAGNOSIS — N2581 Secondary hyperparathyroidism of renal origin: Secondary | ICD-10-CM | POA: Diagnosis not present

## 2019-04-29 DIAGNOSIS — N186 End stage renal disease: Secondary | ICD-10-CM | POA: Diagnosis not present

## 2019-04-29 DIAGNOSIS — N2581 Secondary hyperparathyroidism of renal origin: Secondary | ICD-10-CM | POA: Diagnosis not present

## 2019-04-29 DIAGNOSIS — D509 Iron deficiency anemia, unspecified: Secondary | ICD-10-CM | POA: Diagnosis not present

## 2019-04-29 DIAGNOSIS — Z992 Dependence on renal dialysis: Secondary | ICD-10-CM | POA: Diagnosis not present

## 2019-04-30 DIAGNOSIS — D509 Iron deficiency anemia, unspecified: Secondary | ICD-10-CM | POA: Diagnosis not present

## 2019-04-30 DIAGNOSIS — N2581 Secondary hyperparathyroidism of renal origin: Secondary | ICD-10-CM | POA: Diagnosis not present

## 2019-04-30 DIAGNOSIS — Z992 Dependence on renal dialysis: Secondary | ICD-10-CM | POA: Diagnosis not present

## 2019-04-30 DIAGNOSIS — N186 End stage renal disease: Secondary | ICD-10-CM | POA: Diagnosis not present

## 2019-05-02 DIAGNOSIS — N2581 Secondary hyperparathyroidism of renal origin: Secondary | ICD-10-CM | POA: Diagnosis not present

## 2019-05-02 DIAGNOSIS — Z992 Dependence on renal dialysis: Secondary | ICD-10-CM | POA: Diagnosis not present

## 2019-05-02 DIAGNOSIS — D509 Iron deficiency anemia, unspecified: Secondary | ICD-10-CM | POA: Diagnosis not present

## 2019-05-02 DIAGNOSIS — N186 End stage renal disease: Secondary | ICD-10-CM | POA: Diagnosis not present

## 2019-05-03 DIAGNOSIS — N186 End stage renal disease: Secondary | ICD-10-CM | POA: Diagnosis not present

## 2019-05-03 DIAGNOSIS — Z992 Dependence on renal dialysis: Secondary | ICD-10-CM | POA: Diagnosis not present

## 2019-05-03 DIAGNOSIS — N2581 Secondary hyperparathyroidism of renal origin: Secondary | ICD-10-CM | POA: Diagnosis not present

## 2019-05-03 DIAGNOSIS — I472 Ventricular tachycardia: Secondary | ICD-10-CM | POA: Diagnosis not present

## 2019-05-03 DIAGNOSIS — I4819 Other persistent atrial fibrillation: Secondary | ICD-10-CM | POA: Diagnosis not present

## 2019-05-03 DIAGNOSIS — D509 Iron deficiency anemia, unspecified: Secondary | ICD-10-CM | POA: Diagnosis not present

## 2019-05-03 DIAGNOSIS — I255 Ischemic cardiomyopathy: Secondary | ICD-10-CM | POA: Diagnosis not present

## 2019-05-04 DIAGNOSIS — D509 Iron deficiency anemia, unspecified: Secondary | ICD-10-CM | POA: Diagnosis not present

## 2019-05-04 DIAGNOSIS — N186 End stage renal disease: Secondary | ICD-10-CM | POA: Diagnosis not present

## 2019-05-04 DIAGNOSIS — N2581 Secondary hyperparathyroidism of renal origin: Secondary | ICD-10-CM | POA: Diagnosis not present

## 2019-05-04 DIAGNOSIS — Z992 Dependence on renal dialysis: Secondary | ICD-10-CM | POA: Diagnosis not present

## 2019-05-05 DIAGNOSIS — Z992 Dependence on renal dialysis: Secondary | ICD-10-CM | POA: Diagnosis not present

## 2019-05-05 DIAGNOSIS — N186 End stage renal disease: Secondary | ICD-10-CM | POA: Diagnosis not present

## 2019-05-05 DIAGNOSIS — N2581 Secondary hyperparathyroidism of renal origin: Secondary | ICD-10-CM | POA: Diagnosis not present

## 2019-05-05 DIAGNOSIS — D509 Iron deficiency anemia, unspecified: Secondary | ICD-10-CM | POA: Diagnosis not present

## 2019-05-06 DIAGNOSIS — Z992 Dependence on renal dialysis: Secondary | ICD-10-CM | POA: Diagnosis not present

## 2019-05-06 DIAGNOSIS — N186 End stage renal disease: Secondary | ICD-10-CM | POA: Diagnosis not present

## 2019-05-07 DIAGNOSIS — N186 End stage renal disease: Secondary | ICD-10-CM | POA: Diagnosis not present

## 2019-05-07 DIAGNOSIS — Z992 Dependence on renal dialysis: Secondary | ICD-10-CM | POA: Diagnosis not present

## 2019-05-09 DIAGNOSIS — N186 End stage renal disease: Secondary | ICD-10-CM | POA: Diagnosis not present

## 2019-05-09 DIAGNOSIS — Z992 Dependence on renal dialysis: Secondary | ICD-10-CM | POA: Diagnosis not present

## 2019-05-10 DIAGNOSIS — Z992 Dependence on renal dialysis: Secondary | ICD-10-CM | POA: Diagnosis not present

## 2019-05-10 DIAGNOSIS — N186 End stage renal disease: Secondary | ICD-10-CM | POA: Diagnosis not present

## 2019-05-11 DIAGNOSIS — Z992 Dependence on renal dialysis: Secondary | ICD-10-CM | POA: Diagnosis not present

## 2019-05-11 DIAGNOSIS — N186 End stage renal disease: Secondary | ICD-10-CM | POA: Diagnosis not present

## 2019-05-12 DIAGNOSIS — Z992 Dependence on renal dialysis: Secondary | ICD-10-CM | POA: Diagnosis not present

## 2019-05-12 DIAGNOSIS — N186 End stage renal disease: Secondary | ICD-10-CM | POA: Diagnosis not present

## 2019-05-13 DIAGNOSIS — N186 End stage renal disease: Secondary | ICD-10-CM | POA: Diagnosis not present

## 2019-05-13 DIAGNOSIS — Z992 Dependence on renal dialysis: Secondary | ICD-10-CM | POA: Diagnosis not present

## 2019-05-14 DIAGNOSIS — N186 End stage renal disease: Secondary | ICD-10-CM | POA: Diagnosis not present

## 2019-05-14 DIAGNOSIS — Z992 Dependence on renal dialysis: Secondary | ICD-10-CM | POA: Diagnosis not present

## 2019-05-16 DIAGNOSIS — N186 End stage renal disease: Secondary | ICD-10-CM | POA: Diagnosis not present

## 2019-05-16 DIAGNOSIS — Z992 Dependence on renal dialysis: Secondary | ICD-10-CM | POA: Diagnosis not present

## 2019-05-17 DIAGNOSIS — Z992 Dependence on renal dialysis: Secondary | ICD-10-CM | POA: Diagnosis not present

## 2019-05-17 DIAGNOSIS — N186 End stage renal disease: Secondary | ICD-10-CM | POA: Diagnosis not present

## 2019-05-18 DIAGNOSIS — N186 End stage renal disease: Secondary | ICD-10-CM | POA: Diagnosis not present

## 2019-05-18 DIAGNOSIS — Z992 Dependence on renal dialysis: Secondary | ICD-10-CM | POA: Diagnosis not present

## 2019-05-19 DIAGNOSIS — Z992 Dependence on renal dialysis: Secondary | ICD-10-CM | POA: Diagnosis not present

## 2019-05-19 DIAGNOSIS — N186 End stage renal disease: Secondary | ICD-10-CM | POA: Diagnosis not present

## 2019-05-20 DIAGNOSIS — N186 End stage renal disease: Secondary | ICD-10-CM | POA: Diagnosis not present

## 2019-05-20 DIAGNOSIS — Z992 Dependence on renal dialysis: Secondary | ICD-10-CM | POA: Diagnosis not present

## 2019-05-21 DIAGNOSIS — Z992 Dependence on renal dialysis: Secondary | ICD-10-CM | POA: Diagnosis not present

## 2019-05-21 DIAGNOSIS — N186 End stage renal disease: Secondary | ICD-10-CM | POA: Diagnosis not present

## 2019-05-21 DIAGNOSIS — D509 Iron deficiency anemia, unspecified: Secondary | ICD-10-CM | POA: Diagnosis not present

## 2019-05-21 DIAGNOSIS — N2581 Secondary hyperparathyroidism of renal origin: Secondary | ICD-10-CM | POA: Diagnosis not present

## 2019-05-23 ENCOUNTER — Other Ambulatory Visit: Payer: Self-pay

## 2019-05-23 DIAGNOSIS — N2581 Secondary hyperparathyroidism of renal origin: Secondary | ICD-10-CM | POA: Diagnosis not present

## 2019-05-23 DIAGNOSIS — D509 Iron deficiency anemia, unspecified: Secondary | ICD-10-CM | POA: Diagnosis not present

## 2019-05-23 DIAGNOSIS — Z992 Dependence on renal dialysis: Secondary | ICD-10-CM | POA: Diagnosis not present

## 2019-05-23 DIAGNOSIS — N186 End stage renal disease: Secondary | ICD-10-CM | POA: Diagnosis not present

## 2019-05-24 DIAGNOSIS — Z992 Dependence on renal dialysis: Secondary | ICD-10-CM | POA: Diagnosis not present

## 2019-05-24 DIAGNOSIS — N2581 Secondary hyperparathyroidism of renal origin: Secondary | ICD-10-CM | POA: Diagnosis not present

## 2019-05-24 DIAGNOSIS — D509 Iron deficiency anemia, unspecified: Secondary | ICD-10-CM | POA: Diagnosis not present

## 2019-05-24 DIAGNOSIS — N186 End stage renal disease: Secondary | ICD-10-CM | POA: Diagnosis not present

## 2019-05-25 DIAGNOSIS — D509 Iron deficiency anemia, unspecified: Secondary | ICD-10-CM | POA: Diagnosis not present

## 2019-05-25 DIAGNOSIS — Z992 Dependence on renal dialysis: Secondary | ICD-10-CM | POA: Diagnosis not present

## 2019-05-25 DIAGNOSIS — N186 End stage renal disease: Secondary | ICD-10-CM | POA: Diagnosis not present

## 2019-05-25 DIAGNOSIS — N2581 Secondary hyperparathyroidism of renal origin: Secondary | ICD-10-CM | POA: Diagnosis not present

## 2019-05-26 DIAGNOSIS — D509 Iron deficiency anemia, unspecified: Secondary | ICD-10-CM | POA: Diagnosis not present

## 2019-05-26 DIAGNOSIS — Z992 Dependence on renal dialysis: Secondary | ICD-10-CM | POA: Diagnosis not present

## 2019-05-26 DIAGNOSIS — N2581 Secondary hyperparathyroidism of renal origin: Secondary | ICD-10-CM | POA: Diagnosis not present

## 2019-05-26 DIAGNOSIS — N186 End stage renal disease: Secondary | ICD-10-CM | POA: Diagnosis not present

## 2019-05-27 DIAGNOSIS — Z992 Dependence on renal dialysis: Secondary | ICD-10-CM | POA: Diagnosis not present

## 2019-05-27 DIAGNOSIS — D509 Iron deficiency anemia, unspecified: Secondary | ICD-10-CM | POA: Diagnosis not present

## 2019-05-27 DIAGNOSIS — N2581 Secondary hyperparathyroidism of renal origin: Secondary | ICD-10-CM | POA: Diagnosis not present

## 2019-05-27 DIAGNOSIS — N186 End stage renal disease: Secondary | ICD-10-CM | POA: Diagnosis not present

## 2019-05-28 DIAGNOSIS — N186 End stage renal disease: Secondary | ICD-10-CM | POA: Diagnosis not present

## 2019-05-28 DIAGNOSIS — Z992 Dependence on renal dialysis: Secondary | ICD-10-CM | POA: Diagnosis not present

## 2019-05-28 DIAGNOSIS — N2581 Secondary hyperparathyroidism of renal origin: Secondary | ICD-10-CM | POA: Diagnosis not present

## 2019-05-28 DIAGNOSIS — D509 Iron deficiency anemia, unspecified: Secondary | ICD-10-CM | POA: Diagnosis not present

## 2019-05-30 DIAGNOSIS — N186 End stage renal disease: Secondary | ICD-10-CM | POA: Diagnosis not present

## 2019-05-30 DIAGNOSIS — N2581 Secondary hyperparathyroidism of renal origin: Secondary | ICD-10-CM | POA: Diagnosis not present

## 2019-05-30 DIAGNOSIS — D509 Iron deficiency anemia, unspecified: Secondary | ICD-10-CM | POA: Diagnosis not present

## 2019-05-30 DIAGNOSIS — Z992 Dependence on renal dialysis: Secondary | ICD-10-CM | POA: Diagnosis not present

## 2019-05-31 DIAGNOSIS — N186 End stage renal disease: Secondary | ICD-10-CM | POA: Diagnosis not present

## 2019-05-31 DIAGNOSIS — Z992 Dependence on renal dialysis: Secondary | ICD-10-CM | POA: Diagnosis not present

## 2019-05-31 DIAGNOSIS — N2581 Secondary hyperparathyroidism of renal origin: Secondary | ICD-10-CM | POA: Diagnosis not present

## 2019-05-31 DIAGNOSIS — D509 Iron deficiency anemia, unspecified: Secondary | ICD-10-CM | POA: Diagnosis not present

## 2019-06-01 DIAGNOSIS — N186 End stage renal disease: Secondary | ICD-10-CM | POA: Diagnosis not present

## 2019-06-01 DIAGNOSIS — N2581 Secondary hyperparathyroidism of renal origin: Secondary | ICD-10-CM | POA: Diagnosis not present

## 2019-06-01 DIAGNOSIS — Z992 Dependence on renal dialysis: Secondary | ICD-10-CM | POA: Diagnosis not present

## 2019-06-01 DIAGNOSIS — D509 Iron deficiency anemia, unspecified: Secondary | ICD-10-CM | POA: Diagnosis not present

## 2019-06-02 DIAGNOSIS — D509 Iron deficiency anemia, unspecified: Secondary | ICD-10-CM | POA: Diagnosis not present

## 2019-06-02 DIAGNOSIS — Z992 Dependence on renal dialysis: Secondary | ICD-10-CM | POA: Diagnosis not present

## 2019-06-02 DIAGNOSIS — N186 End stage renal disease: Secondary | ICD-10-CM | POA: Diagnosis not present

## 2019-06-02 DIAGNOSIS — N2581 Secondary hyperparathyroidism of renal origin: Secondary | ICD-10-CM | POA: Diagnosis not present

## 2019-06-03 DIAGNOSIS — N2581 Secondary hyperparathyroidism of renal origin: Secondary | ICD-10-CM | POA: Diagnosis not present

## 2019-06-03 DIAGNOSIS — Z992 Dependence on renal dialysis: Secondary | ICD-10-CM | POA: Diagnosis not present

## 2019-06-03 DIAGNOSIS — N186 End stage renal disease: Secondary | ICD-10-CM | POA: Diagnosis not present

## 2019-06-03 DIAGNOSIS — D509 Iron deficiency anemia, unspecified: Secondary | ICD-10-CM | POA: Diagnosis not present

## 2019-06-04 DIAGNOSIS — D509 Iron deficiency anemia, unspecified: Secondary | ICD-10-CM | POA: Diagnosis not present

## 2019-06-04 DIAGNOSIS — N2581 Secondary hyperparathyroidism of renal origin: Secondary | ICD-10-CM | POA: Diagnosis not present

## 2019-06-04 DIAGNOSIS — N186 End stage renal disease: Secondary | ICD-10-CM | POA: Diagnosis not present

## 2019-06-04 DIAGNOSIS — Z992 Dependence on renal dialysis: Secondary | ICD-10-CM | POA: Diagnosis not present

## 2019-06-06 DIAGNOSIS — N186 End stage renal disease: Secondary | ICD-10-CM | POA: Diagnosis not present

## 2019-06-06 DIAGNOSIS — D509 Iron deficiency anemia, unspecified: Secondary | ICD-10-CM | POA: Diagnosis not present

## 2019-06-06 DIAGNOSIS — Z992 Dependence on renal dialysis: Secondary | ICD-10-CM | POA: Diagnosis not present

## 2019-06-06 DIAGNOSIS — N2581 Secondary hyperparathyroidism of renal origin: Secondary | ICD-10-CM | POA: Diagnosis not present

## 2019-06-07 DIAGNOSIS — Z992 Dependence on renal dialysis: Secondary | ICD-10-CM | POA: Diagnosis not present

## 2019-06-07 DIAGNOSIS — N186 End stage renal disease: Secondary | ICD-10-CM | POA: Diagnosis not present

## 2019-06-07 DIAGNOSIS — N2581 Secondary hyperparathyroidism of renal origin: Secondary | ICD-10-CM | POA: Diagnosis not present

## 2019-06-07 DIAGNOSIS — D509 Iron deficiency anemia, unspecified: Secondary | ICD-10-CM | POA: Diagnosis not present

## 2019-06-08 DIAGNOSIS — N186 End stage renal disease: Secondary | ICD-10-CM | POA: Diagnosis not present

## 2019-06-08 DIAGNOSIS — D509 Iron deficiency anemia, unspecified: Secondary | ICD-10-CM | POA: Diagnosis not present

## 2019-06-08 DIAGNOSIS — Z992 Dependence on renal dialysis: Secondary | ICD-10-CM | POA: Diagnosis not present

## 2019-06-08 DIAGNOSIS — N2581 Secondary hyperparathyroidism of renal origin: Secondary | ICD-10-CM | POA: Diagnosis not present

## 2019-06-09 DIAGNOSIS — N2581 Secondary hyperparathyroidism of renal origin: Secondary | ICD-10-CM | POA: Diagnosis not present

## 2019-06-09 DIAGNOSIS — N186 End stage renal disease: Secondary | ICD-10-CM | POA: Diagnosis not present

## 2019-06-09 DIAGNOSIS — D509 Iron deficiency anemia, unspecified: Secondary | ICD-10-CM | POA: Diagnosis not present

## 2019-06-09 DIAGNOSIS — Z992 Dependence on renal dialysis: Secondary | ICD-10-CM | POA: Diagnosis not present

## 2019-06-10 DIAGNOSIS — D509 Iron deficiency anemia, unspecified: Secondary | ICD-10-CM | POA: Diagnosis not present

## 2019-06-10 DIAGNOSIS — N2581 Secondary hyperparathyroidism of renal origin: Secondary | ICD-10-CM | POA: Diagnosis not present

## 2019-06-10 DIAGNOSIS — Z992 Dependence on renal dialysis: Secondary | ICD-10-CM | POA: Diagnosis not present

## 2019-06-10 DIAGNOSIS — N186 End stage renal disease: Secondary | ICD-10-CM | POA: Diagnosis not present

## 2019-06-11 DIAGNOSIS — N2581 Secondary hyperparathyroidism of renal origin: Secondary | ICD-10-CM | POA: Diagnosis not present

## 2019-06-11 DIAGNOSIS — N186 End stage renal disease: Secondary | ICD-10-CM | POA: Diagnosis not present

## 2019-06-11 DIAGNOSIS — D509 Iron deficiency anemia, unspecified: Secondary | ICD-10-CM | POA: Diagnosis not present

## 2019-06-11 DIAGNOSIS — Z992 Dependence on renal dialysis: Secondary | ICD-10-CM | POA: Diagnosis not present

## 2019-06-13 DIAGNOSIS — N2581 Secondary hyperparathyroidism of renal origin: Secondary | ICD-10-CM | POA: Diagnosis not present

## 2019-06-13 DIAGNOSIS — N186 End stage renal disease: Secondary | ICD-10-CM | POA: Diagnosis not present

## 2019-06-13 DIAGNOSIS — D509 Iron deficiency anemia, unspecified: Secondary | ICD-10-CM | POA: Diagnosis not present

## 2019-06-13 DIAGNOSIS — Z992 Dependence on renal dialysis: Secondary | ICD-10-CM | POA: Diagnosis not present

## 2019-06-14 DIAGNOSIS — R0602 Shortness of breath: Secondary | ICD-10-CM | POA: Diagnosis not present

## 2019-06-14 DIAGNOSIS — N2581 Secondary hyperparathyroidism of renal origin: Secondary | ICD-10-CM | POA: Diagnosis not present

## 2019-06-14 DIAGNOSIS — I480 Paroxysmal atrial fibrillation: Secondary | ICD-10-CM | POA: Diagnosis not present

## 2019-06-14 DIAGNOSIS — I10 Essential (primary) hypertension: Secondary | ICD-10-CM | POA: Diagnosis not present

## 2019-06-14 DIAGNOSIS — Z992 Dependence on renal dialysis: Secondary | ICD-10-CM | POA: Diagnosis not present

## 2019-06-14 DIAGNOSIS — I429 Cardiomyopathy, unspecified: Secondary | ICD-10-CM | POA: Diagnosis not present

## 2019-06-14 DIAGNOSIS — I5022 Chronic systolic (congestive) heart failure: Secondary | ICD-10-CM | POA: Diagnosis not present

## 2019-06-14 DIAGNOSIS — N186 End stage renal disease: Secondary | ICD-10-CM | POA: Diagnosis not present

## 2019-06-14 DIAGNOSIS — I493 Ventricular premature depolarization: Secondary | ICD-10-CM | POA: Diagnosis not present

## 2019-06-14 DIAGNOSIS — I255 Ischemic cardiomyopathy: Secondary | ICD-10-CM | POA: Diagnosis not present

## 2019-06-14 DIAGNOSIS — Z95810 Presence of automatic (implantable) cardiac defibrillator: Secondary | ICD-10-CM | POA: Diagnosis not present

## 2019-06-14 DIAGNOSIS — I519 Heart disease, unspecified: Secondary | ICD-10-CM | POA: Diagnosis not present

## 2019-06-14 DIAGNOSIS — I2510 Atherosclerotic heart disease of native coronary artery without angina pectoris: Secondary | ICD-10-CM | POA: Diagnosis not present

## 2019-06-14 DIAGNOSIS — E7800 Pure hypercholesterolemia, unspecified: Secondary | ICD-10-CM | POA: Diagnosis not present

## 2019-06-14 DIAGNOSIS — E1122 Type 2 diabetes mellitus with diabetic chronic kidney disease: Secondary | ICD-10-CM | POA: Diagnosis not present

## 2019-06-14 DIAGNOSIS — D509 Iron deficiency anemia, unspecified: Secondary | ICD-10-CM | POA: Diagnosis not present

## 2019-06-15 DIAGNOSIS — N2581 Secondary hyperparathyroidism of renal origin: Secondary | ICD-10-CM | POA: Diagnosis not present

## 2019-06-15 DIAGNOSIS — Z992 Dependence on renal dialysis: Secondary | ICD-10-CM | POA: Diagnosis not present

## 2019-06-15 DIAGNOSIS — N186 End stage renal disease: Secondary | ICD-10-CM | POA: Diagnosis not present

## 2019-06-15 DIAGNOSIS — D509 Iron deficiency anemia, unspecified: Secondary | ICD-10-CM | POA: Diagnosis not present

## 2019-06-16 DIAGNOSIS — Z992 Dependence on renal dialysis: Secondary | ICD-10-CM | POA: Diagnosis not present

## 2019-06-16 DIAGNOSIS — D509 Iron deficiency anemia, unspecified: Secondary | ICD-10-CM | POA: Diagnosis not present

## 2019-06-16 DIAGNOSIS — N186 End stage renal disease: Secondary | ICD-10-CM | POA: Diagnosis not present

## 2019-06-16 DIAGNOSIS — N2581 Secondary hyperparathyroidism of renal origin: Secondary | ICD-10-CM | POA: Diagnosis not present

## 2019-06-17 DIAGNOSIS — Z992 Dependence on renal dialysis: Secondary | ICD-10-CM | POA: Diagnosis not present

## 2019-06-17 DIAGNOSIS — N2581 Secondary hyperparathyroidism of renal origin: Secondary | ICD-10-CM | POA: Diagnosis not present

## 2019-06-17 DIAGNOSIS — D509 Iron deficiency anemia, unspecified: Secondary | ICD-10-CM | POA: Diagnosis not present

## 2019-06-17 DIAGNOSIS — N186 End stage renal disease: Secondary | ICD-10-CM | POA: Diagnosis not present

## 2019-06-18 DIAGNOSIS — Z992 Dependence on renal dialysis: Secondary | ICD-10-CM | POA: Diagnosis not present

## 2019-06-18 DIAGNOSIS — N186 End stage renal disease: Secondary | ICD-10-CM | POA: Diagnosis not present

## 2019-06-18 DIAGNOSIS — D509 Iron deficiency anemia, unspecified: Secondary | ICD-10-CM | POA: Diagnosis not present

## 2019-06-18 DIAGNOSIS — N2581 Secondary hyperparathyroidism of renal origin: Secondary | ICD-10-CM | POA: Diagnosis not present

## 2019-06-20 DIAGNOSIS — D509 Iron deficiency anemia, unspecified: Secondary | ICD-10-CM | POA: Diagnosis not present

## 2019-06-20 DIAGNOSIS — N186 End stage renal disease: Secondary | ICD-10-CM | POA: Diagnosis not present

## 2019-06-20 DIAGNOSIS — Z992 Dependence on renal dialysis: Secondary | ICD-10-CM | POA: Diagnosis not present

## 2019-06-20 DIAGNOSIS — N2581 Secondary hyperparathyroidism of renal origin: Secondary | ICD-10-CM | POA: Diagnosis not present

## 2019-06-21 DIAGNOSIS — N186 End stage renal disease: Secondary | ICD-10-CM | POA: Diagnosis not present

## 2019-06-21 DIAGNOSIS — Z992 Dependence on renal dialysis: Secondary | ICD-10-CM | POA: Diagnosis not present

## 2019-06-21 DIAGNOSIS — D509 Iron deficiency anemia, unspecified: Secondary | ICD-10-CM | POA: Diagnosis not present

## 2019-06-21 DIAGNOSIS — D631 Anemia in chronic kidney disease: Secondary | ICD-10-CM | POA: Diagnosis not present

## 2019-06-22 DIAGNOSIS — D631 Anemia in chronic kidney disease: Secondary | ICD-10-CM | POA: Diagnosis not present

## 2019-06-22 DIAGNOSIS — Z992 Dependence on renal dialysis: Secondary | ICD-10-CM | POA: Diagnosis not present

## 2019-06-22 DIAGNOSIS — N186 End stage renal disease: Secondary | ICD-10-CM | POA: Diagnosis not present

## 2019-06-22 DIAGNOSIS — D509 Iron deficiency anemia, unspecified: Secondary | ICD-10-CM | POA: Diagnosis not present

## 2019-06-23 DIAGNOSIS — N186 End stage renal disease: Secondary | ICD-10-CM | POA: Diagnosis not present

## 2019-06-23 DIAGNOSIS — D631 Anemia in chronic kidney disease: Secondary | ICD-10-CM | POA: Diagnosis not present

## 2019-06-23 DIAGNOSIS — D509 Iron deficiency anemia, unspecified: Secondary | ICD-10-CM | POA: Diagnosis not present

## 2019-06-23 DIAGNOSIS — Z992 Dependence on renal dialysis: Secondary | ICD-10-CM | POA: Diagnosis not present

## 2019-06-24 DIAGNOSIS — D509 Iron deficiency anemia, unspecified: Secondary | ICD-10-CM | POA: Diagnosis not present

## 2019-06-24 DIAGNOSIS — Z992 Dependence on renal dialysis: Secondary | ICD-10-CM | POA: Diagnosis not present

## 2019-06-24 DIAGNOSIS — N186 End stage renal disease: Secondary | ICD-10-CM | POA: Diagnosis not present

## 2019-06-24 DIAGNOSIS — D631 Anemia in chronic kidney disease: Secondary | ICD-10-CM | POA: Diagnosis not present

## 2019-06-25 DIAGNOSIS — Z992 Dependence on renal dialysis: Secondary | ICD-10-CM | POA: Diagnosis not present

## 2019-06-25 DIAGNOSIS — N186 End stage renal disease: Secondary | ICD-10-CM | POA: Diagnosis not present

## 2019-06-25 DIAGNOSIS — D631 Anemia in chronic kidney disease: Secondary | ICD-10-CM | POA: Diagnosis not present

## 2019-06-25 DIAGNOSIS — D509 Iron deficiency anemia, unspecified: Secondary | ICD-10-CM | POA: Diagnosis not present

## 2019-06-27 DIAGNOSIS — N186 End stage renal disease: Secondary | ICD-10-CM | POA: Diagnosis not present

## 2019-06-27 DIAGNOSIS — D631 Anemia in chronic kidney disease: Secondary | ICD-10-CM | POA: Diagnosis not present

## 2019-06-27 DIAGNOSIS — D509 Iron deficiency anemia, unspecified: Secondary | ICD-10-CM | POA: Diagnosis not present

## 2019-06-27 DIAGNOSIS — Z992 Dependence on renal dialysis: Secondary | ICD-10-CM | POA: Diagnosis not present

## 2019-06-28 DIAGNOSIS — D631 Anemia in chronic kidney disease: Secondary | ICD-10-CM | POA: Diagnosis not present

## 2019-06-28 DIAGNOSIS — D509 Iron deficiency anemia, unspecified: Secondary | ICD-10-CM | POA: Diagnosis not present

## 2019-06-28 DIAGNOSIS — Z992 Dependence on renal dialysis: Secondary | ICD-10-CM | POA: Diagnosis not present

## 2019-06-28 DIAGNOSIS — N186 End stage renal disease: Secondary | ICD-10-CM | POA: Diagnosis not present

## 2019-06-29 DIAGNOSIS — N186 End stage renal disease: Secondary | ICD-10-CM | POA: Diagnosis not present

## 2019-06-29 DIAGNOSIS — D631 Anemia in chronic kidney disease: Secondary | ICD-10-CM | POA: Diagnosis not present

## 2019-06-29 DIAGNOSIS — Z992 Dependence on renal dialysis: Secondary | ICD-10-CM | POA: Diagnosis not present

## 2019-06-29 DIAGNOSIS — D509 Iron deficiency anemia, unspecified: Secondary | ICD-10-CM | POA: Diagnosis not present

## 2019-06-30 DIAGNOSIS — D631 Anemia in chronic kidney disease: Secondary | ICD-10-CM | POA: Diagnosis not present

## 2019-06-30 DIAGNOSIS — D509 Iron deficiency anemia, unspecified: Secondary | ICD-10-CM | POA: Diagnosis not present

## 2019-06-30 DIAGNOSIS — N186 End stage renal disease: Secondary | ICD-10-CM | POA: Diagnosis not present

## 2019-06-30 DIAGNOSIS — Z992 Dependence on renal dialysis: Secondary | ICD-10-CM | POA: Diagnosis not present

## 2019-07-01 DIAGNOSIS — Z992 Dependence on renal dialysis: Secondary | ICD-10-CM | POA: Diagnosis not present

## 2019-07-01 DIAGNOSIS — D631 Anemia in chronic kidney disease: Secondary | ICD-10-CM | POA: Diagnosis not present

## 2019-07-01 DIAGNOSIS — N186 End stage renal disease: Secondary | ICD-10-CM | POA: Diagnosis not present

## 2019-07-01 DIAGNOSIS — D509 Iron deficiency anemia, unspecified: Secondary | ICD-10-CM | POA: Diagnosis not present

## 2019-07-02 DIAGNOSIS — D509 Iron deficiency anemia, unspecified: Secondary | ICD-10-CM | POA: Diagnosis not present

## 2019-07-02 DIAGNOSIS — Z992 Dependence on renal dialysis: Secondary | ICD-10-CM | POA: Diagnosis not present

## 2019-07-02 DIAGNOSIS — N186 End stage renal disease: Secondary | ICD-10-CM | POA: Diagnosis not present

## 2019-07-02 DIAGNOSIS — D631 Anemia in chronic kidney disease: Secondary | ICD-10-CM | POA: Diagnosis not present

## 2019-07-04 DIAGNOSIS — D631 Anemia in chronic kidney disease: Secondary | ICD-10-CM | POA: Diagnosis not present

## 2019-07-04 DIAGNOSIS — N186 End stage renal disease: Secondary | ICD-10-CM | POA: Diagnosis not present

## 2019-07-04 DIAGNOSIS — D509 Iron deficiency anemia, unspecified: Secondary | ICD-10-CM | POA: Diagnosis not present

## 2019-07-04 DIAGNOSIS — Z992 Dependence on renal dialysis: Secondary | ICD-10-CM | POA: Diagnosis not present

## 2019-07-05 DIAGNOSIS — D631 Anemia in chronic kidney disease: Secondary | ICD-10-CM | POA: Diagnosis not present

## 2019-07-05 DIAGNOSIS — N186 End stage renal disease: Secondary | ICD-10-CM | POA: Diagnosis not present

## 2019-07-05 DIAGNOSIS — D509 Iron deficiency anemia, unspecified: Secondary | ICD-10-CM | POA: Diagnosis not present

## 2019-07-05 DIAGNOSIS — Z992 Dependence on renal dialysis: Secondary | ICD-10-CM | POA: Diagnosis not present

## 2019-07-06 DIAGNOSIS — N186 End stage renal disease: Secondary | ICD-10-CM | POA: Diagnosis not present

## 2019-07-06 DIAGNOSIS — D631 Anemia in chronic kidney disease: Secondary | ICD-10-CM | POA: Diagnosis not present

## 2019-07-06 DIAGNOSIS — Z992 Dependence on renal dialysis: Secondary | ICD-10-CM | POA: Diagnosis not present

## 2019-07-06 DIAGNOSIS — D509 Iron deficiency anemia, unspecified: Secondary | ICD-10-CM | POA: Diagnosis not present

## 2019-07-07 DIAGNOSIS — D631 Anemia in chronic kidney disease: Secondary | ICD-10-CM | POA: Diagnosis not present

## 2019-07-07 DIAGNOSIS — Z992 Dependence on renal dialysis: Secondary | ICD-10-CM | POA: Diagnosis not present

## 2019-07-07 DIAGNOSIS — D509 Iron deficiency anemia, unspecified: Secondary | ICD-10-CM | POA: Diagnosis not present

## 2019-07-07 DIAGNOSIS — N186 End stage renal disease: Secondary | ICD-10-CM | POA: Diagnosis not present

## 2019-07-08 DIAGNOSIS — D631 Anemia in chronic kidney disease: Secondary | ICD-10-CM | POA: Diagnosis not present

## 2019-07-08 DIAGNOSIS — N186 End stage renal disease: Secondary | ICD-10-CM | POA: Diagnosis not present

## 2019-07-08 DIAGNOSIS — D509 Iron deficiency anemia, unspecified: Secondary | ICD-10-CM | POA: Diagnosis not present

## 2019-07-08 DIAGNOSIS — Z992 Dependence on renal dialysis: Secondary | ICD-10-CM | POA: Diagnosis not present

## 2019-07-09 ENCOUNTER — Other Ambulatory Visit: Payer: Self-pay | Admitting: Nephrology

## 2019-07-09 DIAGNOSIS — Z992 Dependence on renal dialysis: Secondary | ICD-10-CM | POA: Diagnosis not present

## 2019-07-09 DIAGNOSIS — N186 End stage renal disease: Secondary | ICD-10-CM | POA: Diagnosis not present

## 2019-07-09 DIAGNOSIS — D631 Anemia in chronic kidney disease: Secondary | ICD-10-CM | POA: Diagnosis not present

## 2019-07-09 DIAGNOSIS — T85611A Breakdown (mechanical) of intraperitoneal dialysis catheter, initial encounter: Secondary | ICD-10-CM

## 2019-07-09 DIAGNOSIS — D509 Iron deficiency anemia, unspecified: Secondary | ICD-10-CM | POA: Diagnosis not present

## 2019-07-11 DIAGNOSIS — D509 Iron deficiency anemia, unspecified: Secondary | ICD-10-CM | POA: Diagnosis not present

## 2019-07-11 DIAGNOSIS — N186 End stage renal disease: Secondary | ICD-10-CM | POA: Diagnosis not present

## 2019-07-11 DIAGNOSIS — Z992 Dependence on renal dialysis: Secondary | ICD-10-CM | POA: Diagnosis not present

## 2019-07-11 DIAGNOSIS — D631 Anemia in chronic kidney disease: Secondary | ICD-10-CM | POA: Diagnosis not present

## 2019-07-12 DIAGNOSIS — D631 Anemia in chronic kidney disease: Secondary | ICD-10-CM | POA: Diagnosis not present

## 2019-07-12 DIAGNOSIS — Z992 Dependence on renal dialysis: Secondary | ICD-10-CM | POA: Diagnosis not present

## 2019-07-12 DIAGNOSIS — D509 Iron deficiency anemia, unspecified: Secondary | ICD-10-CM | POA: Diagnosis not present

## 2019-07-12 DIAGNOSIS — N186 End stage renal disease: Secondary | ICD-10-CM | POA: Diagnosis not present

## 2019-07-13 DIAGNOSIS — Z992 Dependence on renal dialysis: Secondary | ICD-10-CM | POA: Diagnosis not present

## 2019-07-13 DIAGNOSIS — N186 End stage renal disease: Secondary | ICD-10-CM | POA: Diagnosis not present

## 2019-07-13 DIAGNOSIS — D631 Anemia in chronic kidney disease: Secondary | ICD-10-CM | POA: Diagnosis not present

## 2019-07-13 DIAGNOSIS — D509 Iron deficiency anemia, unspecified: Secondary | ICD-10-CM | POA: Diagnosis not present

## 2019-07-14 DIAGNOSIS — D631 Anemia in chronic kidney disease: Secondary | ICD-10-CM | POA: Diagnosis not present

## 2019-07-14 DIAGNOSIS — Z992 Dependence on renal dialysis: Secondary | ICD-10-CM | POA: Diagnosis not present

## 2019-07-14 DIAGNOSIS — D509 Iron deficiency anemia, unspecified: Secondary | ICD-10-CM | POA: Diagnosis not present

## 2019-07-14 DIAGNOSIS — N186 End stage renal disease: Secondary | ICD-10-CM | POA: Diagnosis not present

## 2019-07-15 DIAGNOSIS — D631 Anemia in chronic kidney disease: Secondary | ICD-10-CM | POA: Diagnosis not present

## 2019-07-15 DIAGNOSIS — Z992 Dependence on renal dialysis: Secondary | ICD-10-CM | POA: Diagnosis not present

## 2019-07-15 DIAGNOSIS — D509 Iron deficiency anemia, unspecified: Secondary | ICD-10-CM | POA: Diagnosis not present

## 2019-07-15 DIAGNOSIS — N186 End stage renal disease: Secondary | ICD-10-CM | POA: Diagnosis not present

## 2019-07-16 DIAGNOSIS — D509 Iron deficiency anemia, unspecified: Secondary | ICD-10-CM | POA: Diagnosis not present

## 2019-07-16 DIAGNOSIS — Z992 Dependence on renal dialysis: Secondary | ICD-10-CM | POA: Diagnosis not present

## 2019-07-16 DIAGNOSIS — D631 Anemia in chronic kidney disease: Secondary | ICD-10-CM | POA: Diagnosis not present

## 2019-07-16 DIAGNOSIS — N186 End stage renal disease: Secondary | ICD-10-CM | POA: Diagnosis not present

## 2019-07-18 DIAGNOSIS — Z992 Dependence on renal dialysis: Secondary | ICD-10-CM | POA: Diagnosis not present

## 2019-07-18 DIAGNOSIS — D631 Anemia in chronic kidney disease: Secondary | ICD-10-CM | POA: Diagnosis not present

## 2019-07-18 DIAGNOSIS — N186 End stage renal disease: Secondary | ICD-10-CM | POA: Diagnosis not present

## 2019-07-18 DIAGNOSIS — D509 Iron deficiency anemia, unspecified: Secondary | ICD-10-CM | POA: Diagnosis not present

## 2019-07-19 DIAGNOSIS — D509 Iron deficiency anemia, unspecified: Secondary | ICD-10-CM | POA: Diagnosis not present

## 2019-07-19 DIAGNOSIS — Z992 Dependence on renal dialysis: Secondary | ICD-10-CM | POA: Diagnosis not present

## 2019-07-19 DIAGNOSIS — N186 End stage renal disease: Secondary | ICD-10-CM | POA: Diagnosis not present

## 2019-07-19 DIAGNOSIS — D631 Anemia in chronic kidney disease: Secondary | ICD-10-CM | POA: Diagnosis not present

## 2019-07-20 DIAGNOSIS — D631 Anemia in chronic kidney disease: Secondary | ICD-10-CM | POA: Diagnosis not present

## 2019-07-20 DIAGNOSIS — N186 End stage renal disease: Secondary | ICD-10-CM | POA: Diagnosis not present

## 2019-07-20 DIAGNOSIS — D509 Iron deficiency anemia, unspecified: Secondary | ICD-10-CM | POA: Diagnosis not present

## 2019-07-20 DIAGNOSIS — Z992 Dependence on renal dialysis: Secondary | ICD-10-CM | POA: Diagnosis not present

## 2019-07-21 DIAGNOSIS — D509 Iron deficiency anemia, unspecified: Secondary | ICD-10-CM | POA: Diagnosis not present

## 2019-07-21 DIAGNOSIS — Z992 Dependence on renal dialysis: Secondary | ICD-10-CM | POA: Diagnosis not present

## 2019-07-21 DIAGNOSIS — N186 End stage renal disease: Secondary | ICD-10-CM | POA: Diagnosis not present

## 2019-07-21 DIAGNOSIS — N2581 Secondary hyperparathyroidism of renal origin: Secondary | ICD-10-CM | POA: Diagnosis not present

## 2019-07-22 DIAGNOSIS — Z992 Dependence on renal dialysis: Secondary | ICD-10-CM | POA: Diagnosis not present

## 2019-07-22 DIAGNOSIS — N186 End stage renal disease: Secondary | ICD-10-CM | POA: Diagnosis not present

## 2019-07-22 DIAGNOSIS — D509 Iron deficiency anemia, unspecified: Secondary | ICD-10-CM | POA: Diagnosis not present

## 2019-07-22 DIAGNOSIS — N2581 Secondary hyperparathyroidism of renal origin: Secondary | ICD-10-CM | POA: Diagnosis not present

## 2019-07-23 DIAGNOSIS — Z992 Dependence on renal dialysis: Secondary | ICD-10-CM | POA: Diagnosis not present

## 2019-07-23 DIAGNOSIS — D509 Iron deficiency anemia, unspecified: Secondary | ICD-10-CM | POA: Diagnosis not present

## 2019-07-23 DIAGNOSIS — N2581 Secondary hyperparathyroidism of renal origin: Secondary | ICD-10-CM | POA: Diagnosis not present

## 2019-07-23 DIAGNOSIS — N186 End stage renal disease: Secondary | ICD-10-CM | POA: Diagnosis not present

## 2019-07-25 DIAGNOSIS — N186 End stage renal disease: Secondary | ICD-10-CM | POA: Diagnosis not present

## 2019-07-25 DIAGNOSIS — N2581 Secondary hyperparathyroidism of renal origin: Secondary | ICD-10-CM | POA: Diagnosis not present

## 2019-07-25 DIAGNOSIS — Z992 Dependence on renal dialysis: Secondary | ICD-10-CM | POA: Diagnosis not present

## 2019-07-25 DIAGNOSIS — D509 Iron deficiency anemia, unspecified: Secondary | ICD-10-CM | POA: Diagnosis not present

## 2019-07-26 DIAGNOSIS — N186 End stage renal disease: Secondary | ICD-10-CM | POA: Diagnosis not present

## 2019-07-26 DIAGNOSIS — N2581 Secondary hyperparathyroidism of renal origin: Secondary | ICD-10-CM | POA: Diagnosis not present

## 2019-07-26 DIAGNOSIS — D509 Iron deficiency anemia, unspecified: Secondary | ICD-10-CM | POA: Diagnosis not present

## 2019-07-26 DIAGNOSIS — Z992 Dependence on renal dialysis: Secondary | ICD-10-CM | POA: Diagnosis not present

## 2019-07-27 DIAGNOSIS — Z992 Dependence on renal dialysis: Secondary | ICD-10-CM | POA: Diagnosis not present

## 2019-07-27 DIAGNOSIS — N186 End stage renal disease: Secondary | ICD-10-CM | POA: Diagnosis not present

## 2019-07-27 DIAGNOSIS — N2581 Secondary hyperparathyroidism of renal origin: Secondary | ICD-10-CM | POA: Diagnosis not present

## 2019-07-27 DIAGNOSIS — D509 Iron deficiency anemia, unspecified: Secondary | ICD-10-CM | POA: Diagnosis not present

## 2019-07-28 DIAGNOSIS — Z992 Dependence on renal dialysis: Secondary | ICD-10-CM | POA: Diagnosis not present

## 2019-07-28 DIAGNOSIS — N2581 Secondary hyperparathyroidism of renal origin: Secondary | ICD-10-CM | POA: Diagnosis not present

## 2019-07-28 DIAGNOSIS — N186 End stage renal disease: Secondary | ICD-10-CM | POA: Diagnosis not present

## 2019-07-28 DIAGNOSIS — D509 Iron deficiency anemia, unspecified: Secondary | ICD-10-CM | POA: Diagnosis not present

## 2019-07-29 DIAGNOSIS — Z992 Dependence on renal dialysis: Secondary | ICD-10-CM | POA: Diagnosis not present

## 2019-07-29 DIAGNOSIS — N2581 Secondary hyperparathyroidism of renal origin: Secondary | ICD-10-CM | POA: Diagnosis not present

## 2019-07-29 DIAGNOSIS — D509 Iron deficiency anemia, unspecified: Secondary | ICD-10-CM | POA: Diagnosis not present

## 2019-07-29 DIAGNOSIS — N186 End stage renal disease: Secondary | ICD-10-CM | POA: Diagnosis not present

## 2019-07-29 DIAGNOSIS — E119 Type 2 diabetes mellitus without complications: Secondary | ICD-10-CM | POA: Diagnosis not present

## 2019-07-30 DIAGNOSIS — N2581 Secondary hyperparathyroidism of renal origin: Secondary | ICD-10-CM | POA: Diagnosis not present

## 2019-07-30 DIAGNOSIS — D509 Iron deficiency anemia, unspecified: Secondary | ICD-10-CM | POA: Diagnosis not present

## 2019-07-30 DIAGNOSIS — N186 End stage renal disease: Secondary | ICD-10-CM | POA: Diagnosis not present

## 2019-07-30 DIAGNOSIS — Z992 Dependence on renal dialysis: Secondary | ICD-10-CM | POA: Diagnosis not present

## 2019-08-01 DIAGNOSIS — D509 Iron deficiency anemia, unspecified: Secondary | ICD-10-CM | POA: Diagnosis not present

## 2019-08-01 DIAGNOSIS — N186 End stage renal disease: Secondary | ICD-10-CM | POA: Diagnosis not present

## 2019-08-01 DIAGNOSIS — N2581 Secondary hyperparathyroidism of renal origin: Secondary | ICD-10-CM | POA: Diagnosis not present

## 2019-08-01 DIAGNOSIS — Z992 Dependence on renal dialysis: Secondary | ICD-10-CM | POA: Diagnosis not present

## 2019-08-02 DIAGNOSIS — Z992 Dependence on renal dialysis: Secondary | ICD-10-CM | POA: Diagnosis not present

## 2019-08-02 DIAGNOSIS — N2581 Secondary hyperparathyroidism of renal origin: Secondary | ICD-10-CM | POA: Diagnosis not present

## 2019-08-02 DIAGNOSIS — N186 End stage renal disease: Secondary | ICD-10-CM | POA: Diagnosis not present

## 2019-08-02 DIAGNOSIS — D509 Iron deficiency anemia, unspecified: Secondary | ICD-10-CM | POA: Diagnosis not present

## 2019-08-03 DIAGNOSIS — Z992 Dependence on renal dialysis: Secondary | ICD-10-CM | POA: Diagnosis not present

## 2019-08-03 DIAGNOSIS — N2581 Secondary hyperparathyroidism of renal origin: Secondary | ICD-10-CM | POA: Diagnosis not present

## 2019-08-03 DIAGNOSIS — N186 End stage renal disease: Secondary | ICD-10-CM | POA: Diagnosis not present

## 2019-08-03 DIAGNOSIS — D509 Iron deficiency anemia, unspecified: Secondary | ICD-10-CM | POA: Diagnosis not present

## 2019-08-04 DIAGNOSIS — N186 End stage renal disease: Secondary | ICD-10-CM | POA: Diagnosis not present

## 2019-08-04 DIAGNOSIS — Z992 Dependence on renal dialysis: Secondary | ICD-10-CM | POA: Diagnosis not present

## 2019-08-04 DIAGNOSIS — N2581 Secondary hyperparathyroidism of renal origin: Secondary | ICD-10-CM | POA: Diagnosis not present

## 2019-08-04 DIAGNOSIS — D509 Iron deficiency anemia, unspecified: Secondary | ICD-10-CM | POA: Diagnosis not present

## 2019-08-05 DIAGNOSIS — D509 Iron deficiency anemia, unspecified: Secondary | ICD-10-CM | POA: Diagnosis not present

## 2019-08-05 DIAGNOSIS — N186 End stage renal disease: Secondary | ICD-10-CM | POA: Diagnosis not present

## 2019-08-05 DIAGNOSIS — Z992 Dependence on renal dialysis: Secondary | ICD-10-CM | POA: Diagnosis not present

## 2019-08-05 DIAGNOSIS — N2581 Secondary hyperparathyroidism of renal origin: Secondary | ICD-10-CM | POA: Diagnosis not present

## 2019-08-06 DIAGNOSIS — N186 End stage renal disease: Secondary | ICD-10-CM | POA: Diagnosis not present

## 2019-08-06 DIAGNOSIS — D509 Iron deficiency anemia, unspecified: Secondary | ICD-10-CM | POA: Diagnosis not present

## 2019-08-06 DIAGNOSIS — N2581 Secondary hyperparathyroidism of renal origin: Secondary | ICD-10-CM | POA: Diagnosis not present

## 2019-08-06 DIAGNOSIS — Z992 Dependence on renal dialysis: Secondary | ICD-10-CM | POA: Diagnosis not present

## 2019-08-08 DIAGNOSIS — N2581 Secondary hyperparathyroidism of renal origin: Secondary | ICD-10-CM | POA: Diagnosis not present

## 2019-08-08 DIAGNOSIS — D509 Iron deficiency anemia, unspecified: Secondary | ICD-10-CM | POA: Diagnosis not present

## 2019-08-08 DIAGNOSIS — N186 End stage renal disease: Secondary | ICD-10-CM | POA: Diagnosis not present

## 2019-08-08 DIAGNOSIS — Z992 Dependence on renal dialysis: Secondary | ICD-10-CM | POA: Diagnosis not present

## 2019-08-09 DIAGNOSIS — Z992 Dependence on renal dialysis: Secondary | ICD-10-CM | POA: Diagnosis not present

## 2019-08-09 DIAGNOSIS — D509 Iron deficiency anemia, unspecified: Secondary | ICD-10-CM | POA: Diagnosis not present

## 2019-08-09 DIAGNOSIS — N2581 Secondary hyperparathyroidism of renal origin: Secondary | ICD-10-CM | POA: Diagnosis not present

## 2019-08-09 DIAGNOSIS — N186 End stage renal disease: Secondary | ICD-10-CM | POA: Diagnosis not present

## 2019-08-10 DIAGNOSIS — Z992 Dependence on renal dialysis: Secondary | ICD-10-CM | POA: Diagnosis not present

## 2019-08-10 DIAGNOSIS — N186 End stage renal disease: Secondary | ICD-10-CM | POA: Diagnosis not present

## 2019-08-10 DIAGNOSIS — N2581 Secondary hyperparathyroidism of renal origin: Secondary | ICD-10-CM | POA: Diagnosis not present

## 2019-08-10 DIAGNOSIS — D509 Iron deficiency anemia, unspecified: Secondary | ICD-10-CM | POA: Diagnosis not present

## 2019-08-11 DIAGNOSIS — D509 Iron deficiency anemia, unspecified: Secondary | ICD-10-CM | POA: Diagnosis not present

## 2019-08-11 DIAGNOSIS — N2581 Secondary hyperparathyroidism of renal origin: Secondary | ICD-10-CM | POA: Diagnosis not present

## 2019-08-11 DIAGNOSIS — Z992 Dependence on renal dialysis: Secondary | ICD-10-CM | POA: Diagnosis not present

## 2019-08-11 DIAGNOSIS — N186 End stage renal disease: Secondary | ICD-10-CM | POA: Diagnosis not present

## 2019-08-12 DIAGNOSIS — D509 Iron deficiency anemia, unspecified: Secondary | ICD-10-CM | POA: Diagnosis not present

## 2019-08-12 DIAGNOSIS — N2581 Secondary hyperparathyroidism of renal origin: Secondary | ICD-10-CM | POA: Diagnosis not present

## 2019-08-12 DIAGNOSIS — Z992 Dependence on renal dialysis: Secondary | ICD-10-CM | POA: Diagnosis not present

## 2019-08-12 DIAGNOSIS — N186 End stage renal disease: Secondary | ICD-10-CM | POA: Diagnosis not present

## 2019-08-13 DIAGNOSIS — N2581 Secondary hyperparathyroidism of renal origin: Secondary | ICD-10-CM | POA: Diagnosis not present

## 2019-08-13 DIAGNOSIS — D509 Iron deficiency anemia, unspecified: Secondary | ICD-10-CM | POA: Diagnosis not present

## 2019-08-13 DIAGNOSIS — N186 End stage renal disease: Secondary | ICD-10-CM | POA: Diagnosis not present

## 2019-08-13 DIAGNOSIS — Z992 Dependence on renal dialysis: Secondary | ICD-10-CM | POA: Diagnosis not present

## 2019-08-15 DIAGNOSIS — D509 Iron deficiency anemia, unspecified: Secondary | ICD-10-CM | POA: Diagnosis not present

## 2019-08-15 DIAGNOSIS — N2581 Secondary hyperparathyroidism of renal origin: Secondary | ICD-10-CM | POA: Diagnosis not present

## 2019-08-15 DIAGNOSIS — Z992 Dependence on renal dialysis: Secondary | ICD-10-CM | POA: Diagnosis not present

## 2019-08-15 DIAGNOSIS — N186 End stage renal disease: Secondary | ICD-10-CM | POA: Diagnosis not present

## 2019-08-16 DIAGNOSIS — N2581 Secondary hyperparathyroidism of renal origin: Secondary | ICD-10-CM | POA: Diagnosis not present

## 2019-08-16 DIAGNOSIS — N186 End stage renal disease: Secondary | ICD-10-CM | POA: Diagnosis not present

## 2019-08-16 DIAGNOSIS — D509 Iron deficiency anemia, unspecified: Secondary | ICD-10-CM | POA: Diagnosis not present

## 2019-08-16 DIAGNOSIS — Z992 Dependence on renal dialysis: Secondary | ICD-10-CM | POA: Diagnosis not present

## 2019-08-17 DIAGNOSIS — N2581 Secondary hyperparathyroidism of renal origin: Secondary | ICD-10-CM | POA: Diagnosis not present

## 2019-08-17 DIAGNOSIS — Z992 Dependence on renal dialysis: Secondary | ICD-10-CM | POA: Diagnosis not present

## 2019-08-17 DIAGNOSIS — D509 Iron deficiency anemia, unspecified: Secondary | ICD-10-CM | POA: Diagnosis not present

## 2019-08-17 DIAGNOSIS — N186 End stage renal disease: Secondary | ICD-10-CM | POA: Diagnosis not present

## 2019-08-18 DIAGNOSIS — N186 End stage renal disease: Secondary | ICD-10-CM | POA: Diagnosis not present

## 2019-08-18 DIAGNOSIS — N2581 Secondary hyperparathyroidism of renal origin: Secondary | ICD-10-CM | POA: Diagnosis not present

## 2019-08-18 DIAGNOSIS — D509 Iron deficiency anemia, unspecified: Secondary | ICD-10-CM | POA: Diagnosis not present

## 2019-08-18 DIAGNOSIS — Z992 Dependence on renal dialysis: Secondary | ICD-10-CM | POA: Diagnosis not present

## 2019-08-19 DIAGNOSIS — N186 End stage renal disease: Secondary | ICD-10-CM | POA: Diagnosis not present

## 2019-08-19 DIAGNOSIS — N2581 Secondary hyperparathyroidism of renal origin: Secondary | ICD-10-CM | POA: Diagnosis not present

## 2019-08-19 DIAGNOSIS — Z992 Dependence on renal dialysis: Secondary | ICD-10-CM | POA: Diagnosis not present

## 2019-08-19 DIAGNOSIS — D509 Iron deficiency anemia, unspecified: Secondary | ICD-10-CM | POA: Diagnosis not present

## 2019-08-20 DIAGNOSIS — N186 End stage renal disease: Secondary | ICD-10-CM | POA: Diagnosis not present

## 2019-08-20 DIAGNOSIS — Z992 Dependence on renal dialysis: Secondary | ICD-10-CM | POA: Diagnosis not present

## 2019-08-20 DIAGNOSIS — D509 Iron deficiency anemia, unspecified: Secondary | ICD-10-CM | POA: Diagnosis not present

## 2019-08-20 DIAGNOSIS — N2581 Secondary hyperparathyroidism of renal origin: Secondary | ICD-10-CM | POA: Diagnosis not present

## 2019-08-22 DIAGNOSIS — N186 End stage renal disease: Secondary | ICD-10-CM | POA: Diagnosis not present

## 2019-08-22 DIAGNOSIS — Z992 Dependence on renal dialysis: Secondary | ICD-10-CM | POA: Diagnosis not present

## 2019-08-22 DIAGNOSIS — D631 Anemia in chronic kidney disease: Secondary | ICD-10-CM | POA: Diagnosis not present

## 2019-08-23 DIAGNOSIS — Z992 Dependence on renal dialysis: Secondary | ICD-10-CM | POA: Diagnosis not present

## 2019-08-23 DIAGNOSIS — D631 Anemia in chronic kidney disease: Secondary | ICD-10-CM | POA: Diagnosis not present

## 2019-08-23 DIAGNOSIS — N186 End stage renal disease: Secondary | ICD-10-CM | POA: Diagnosis not present

## 2019-08-24 DIAGNOSIS — N186 End stage renal disease: Secondary | ICD-10-CM | POA: Diagnosis not present

## 2019-08-24 DIAGNOSIS — Z992 Dependence on renal dialysis: Secondary | ICD-10-CM | POA: Diagnosis not present

## 2019-08-24 DIAGNOSIS — D631 Anemia in chronic kidney disease: Secondary | ICD-10-CM | POA: Diagnosis not present

## 2019-08-25 DIAGNOSIS — Z992 Dependence on renal dialysis: Secondary | ICD-10-CM | POA: Diagnosis not present

## 2019-08-25 DIAGNOSIS — D631 Anemia in chronic kidney disease: Secondary | ICD-10-CM | POA: Diagnosis not present

## 2019-08-25 DIAGNOSIS — N186 End stage renal disease: Secondary | ICD-10-CM | POA: Diagnosis not present

## 2019-08-26 DIAGNOSIS — D631 Anemia in chronic kidney disease: Secondary | ICD-10-CM | POA: Diagnosis not present

## 2019-08-26 DIAGNOSIS — Z992 Dependence on renal dialysis: Secondary | ICD-10-CM | POA: Diagnosis not present

## 2019-08-26 DIAGNOSIS — Z79899 Other long term (current) drug therapy: Secondary | ICD-10-CM | POA: Diagnosis not present

## 2019-08-26 DIAGNOSIS — N186 End stage renal disease: Secondary | ICD-10-CM | POA: Diagnosis not present

## 2019-08-27 DIAGNOSIS — D631 Anemia in chronic kidney disease: Secondary | ICD-10-CM | POA: Diagnosis not present

## 2019-08-27 DIAGNOSIS — Z992 Dependence on renal dialysis: Secondary | ICD-10-CM | POA: Diagnosis not present

## 2019-08-27 DIAGNOSIS — N186 End stage renal disease: Secondary | ICD-10-CM | POA: Diagnosis not present

## 2019-08-29 DIAGNOSIS — Z992 Dependence on renal dialysis: Secondary | ICD-10-CM | POA: Diagnosis not present

## 2019-08-29 DIAGNOSIS — D631 Anemia in chronic kidney disease: Secondary | ICD-10-CM | POA: Diagnosis not present

## 2019-08-29 DIAGNOSIS — N186 End stage renal disease: Secondary | ICD-10-CM | POA: Diagnosis not present

## 2019-08-30 DIAGNOSIS — N186 End stage renal disease: Secondary | ICD-10-CM | POA: Diagnosis not present

## 2019-08-30 DIAGNOSIS — D631 Anemia in chronic kidney disease: Secondary | ICD-10-CM | POA: Diagnosis not present

## 2019-08-30 DIAGNOSIS — Z992 Dependence on renal dialysis: Secondary | ICD-10-CM | POA: Diagnosis not present

## 2019-08-31 DIAGNOSIS — N186 End stage renal disease: Secondary | ICD-10-CM | POA: Diagnosis not present

## 2019-08-31 DIAGNOSIS — Z992 Dependence on renal dialysis: Secondary | ICD-10-CM | POA: Diagnosis not present

## 2019-08-31 DIAGNOSIS — D631 Anemia in chronic kidney disease: Secondary | ICD-10-CM | POA: Diagnosis not present

## 2019-09-01 DIAGNOSIS — Z992 Dependence on renal dialysis: Secondary | ICD-10-CM | POA: Diagnosis not present

## 2019-09-01 DIAGNOSIS — N186 End stage renal disease: Secondary | ICD-10-CM | POA: Diagnosis not present

## 2019-09-01 DIAGNOSIS — D631 Anemia in chronic kidney disease: Secondary | ICD-10-CM | POA: Diagnosis not present

## 2019-09-02 DIAGNOSIS — Z992 Dependence on renal dialysis: Secondary | ICD-10-CM | POA: Diagnosis not present

## 2019-09-02 DIAGNOSIS — N186 End stage renal disease: Secondary | ICD-10-CM | POA: Diagnosis not present

## 2019-09-02 DIAGNOSIS — D631 Anemia in chronic kidney disease: Secondary | ICD-10-CM | POA: Diagnosis not present

## 2019-09-03 DIAGNOSIS — D631 Anemia in chronic kidney disease: Secondary | ICD-10-CM | POA: Diagnosis not present

## 2019-09-03 DIAGNOSIS — Z992 Dependence on renal dialysis: Secondary | ICD-10-CM | POA: Diagnosis not present

## 2019-09-03 DIAGNOSIS — N186 End stage renal disease: Secondary | ICD-10-CM | POA: Diagnosis not present

## 2019-09-05 DIAGNOSIS — N186 End stage renal disease: Secondary | ICD-10-CM | POA: Diagnosis not present

## 2019-09-05 DIAGNOSIS — D631 Anemia in chronic kidney disease: Secondary | ICD-10-CM | POA: Diagnosis not present

## 2019-09-05 DIAGNOSIS — Z992 Dependence on renal dialysis: Secondary | ICD-10-CM | POA: Diagnosis not present

## 2019-09-06 DIAGNOSIS — N186 End stage renal disease: Secondary | ICD-10-CM | POA: Diagnosis not present

## 2019-09-06 DIAGNOSIS — Z992 Dependence on renal dialysis: Secondary | ICD-10-CM | POA: Diagnosis not present

## 2019-09-06 DIAGNOSIS — D631 Anemia in chronic kidney disease: Secondary | ICD-10-CM | POA: Diagnosis not present

## 2019-09-07 DIAGNOSIS — N186 End stage renal disease: Secondary | ICD-10-CM | POA: Diagnosis not present

## 2019-09-07 DIAGNOSIS — D631 Anemia in chronic kidney disease: Secondary | ICD-10-CM | POA: Diagnosis not present

## 2019-09-07 DIAGNOSIS — Z992 Dependence on renal dialysis: Secondary | ICD-10-CM | POA: Diagnosis not present

## 2019-09-08 DIAGNOSIS — Z992 Dependence on renal dialysis: Secondary | ICD-10-CM | POA: Diagnosis not present

## 2019-09-08 DIAGNOSIS — N186 End stage renal disease: Secondary | ICD-10-CM | POA: Diagnosis not present

## 2019-09-08 DIAGNOSIS — D631 Anemia in chronic kidney disease: Secondary | ICD-10-CM | POA: Diagnosis not present

## 2019-09-09 DIAGNOSIS — Z992 Dependence on renal dialysis: Secondary | ICD-10-CM | POA: Diagnosis not present

## 2019-09-09 DIAGNOSIS — N186 End stage renal disease: Secondary | ICD-10-CM | POA: Diagnosis not present

## 2019-09-09 DIAGNOSIS — D631 Anemia in chronic kidney disease: Secondary | ICD-10-CM | POA: Diagnosis not present

## 2019-09-10 DIAGNOSIS — Z992 Dependence on renal dialysis: Secondary | ICD-10-CM | POA: Diagnosis not present

## 2019-09-10 DIAGNOSIS — N186 End stage renal disease: Secondary | ICD-10-CM | POA: Diagnosis not present

## 2019-09-10 DIAGNOSIS — D631 Anemia in chronic kidney disease: Secondary | ICD-10-CM | POA: Diagnosis not present

## 2019-09-12 DIAGNOSIS — I472 Ventricular tachycardia: Secondary | ICD-10-CM | POA: Diagnosis not present

## 2019-09-12 DIAGNOSIS — Z992 Dependence on renal dialysis: Secondary | ICD-10-CM | POA: Diagnosis not present

## 2019-09-12 DIAGNOSIS — D631 Anemia in chronic kidney disease: Secondary | ICD-10-CM | POA: Diagnosis not present

## 2019-09-12 DIAGNOSIS — Z4502 Encounter for adjustment and management of automatic implantable cardiac defibrillator: Secondary | ICD-10-CM | POA: Diagnosis not present

## 2019-09-12 DIAGNOSIS — N186 End stage renal disease: Secondary | ICD-10-CM | POA: Diagnosis not present

## 2019-09-13 DIAGNOSIS — Z992 Dependence on renal dialysis: Secondary | ICD-10-CM | POA: Diagnosis not present

## 2019-09-13 DIAGNOSIS — N186 End stage renal disease: Secondary | ICD-10-CM | POA: Diagnosis not present

## 2019-09-13 DIAGNOSIS — D631 Anemia in chronic kidney disease: Secondary | ICD-10-CM | POA: Diagnosis not present

## 2019-09-14 DIAGNOSIS — N186 End stage renal disease: Secondary | ICD-10-CM | POA: Diagnosis not present

## 2019-09-14 DIAGNOSIS — Z992 Dependence on renal dialysis: Secondary | ICD-10-CM | POA: Diagnosis not present

## 2019-09-14 DIAGNOSIS — D631 Anemia in chronic kidney disease: Secondary | ICD-10-CM | POA: Diagnosis not present

## 2019-09-15 DIAGNOSIS — D631 Anemia in chronic kidney disease: Secondary | ICD-10-CM | POA: Diagnosis not present

## 2019-09-15 DIAGNOSIS — Z992 Dependence on renal dialysis: Secondary | ICD-10-CM | POA: Diagnosis not present

## 2019-09-15 DIAGNOSIS — N186 End stage renal disease: Secondary | ICD-10-CM | POA: Diagnosis not present

## 2019-09-16 DIAGNOSIS — Z992 Dependence on renal dialysis: Secondary | ICD-10-CM | POA: Diagnosis not present

## 2019-09-16 DIAGNOSIS — D631 Anemia in chronic kidney disease: Secondary | ICD-10-CM | POA: Diagnosis not present

## 2019-09-16 DIAGNOSIS — N186 End stage renal disease: Secondary | ICD-10-CM | POA: Diagnosis not present

## 2019-09-17 DIAGNOSIS — D631 Anemia in chronic kidney disease: Secondary | ICD-10-CM | POA: Diagnosis not present

## 2019-09-17 DIAGNOSIS — N186 End stage renal disease: Secondary | ICD-10-CM | POA: Diagnosis not present

## 2019-09-17 DIAGNOSIS — Z992 Dependence on renal dialysis: Secondary | ICD-10-CM | POA: Diagnosis not present

## 2019-09-19 DIAGNOSIS — Z992 Dependence on renal dialysis: Secondary | ICD-10-CM | POA: Diagnosis not present

## 2019-09-19 DIAGNOSIS — N186 End stage renal disease: Secondary | ICD-10-CM | POA: Diagnosis not present

## 2019-09-19 DIAGNOSIS — D631 Anemia in chronic kidney disease: Secondary | ICD-10-CM | POA: Diagnosis not present

## 2019-10-21 DIAGNOSIS — N186 End stage renal disease: Secondary | ICD-10-CM | POA: Diagnosis not present

## 2019-10-21 DIAGNOSIS — N2581 Secondary hyperparathyroidism of renal origin: Secondary | ICD-10-CM | POA: Diagnosis not present

## 2019-10-21 DIAGNOSIS — Z992 Dependence on renal dialysis: Secondary | ICD-10-CM | POA: Diagnosis not present

## 2019-10-21 DIAGNOSIS — D509 Iron deficiency anemia, unspecified: Secondary | ICD-10-CM | POA: Diagnosis not present

## 2019-10-22 DIAGNOSIS — D509 Iron deficiency anemia, unspecified: Secondary | ICD-10-CM | POA: Diagnosis not present

## 2019-10-22 DIAGNOSIS — N2581 Secondary hyperparathyroidism of renal origin: Secondary | ICD-10-CM | POA: Diagnosis not present

## 2019-10-22 DIAGNOSIS — N186 End stage renal disease: Secondary | ICD-10-CM | POA: Diagnosis not present

## 2019-10-22 DIAGNOSIS — Z992 Dependence on renal dialysis: Secondary | ICD-10-CM | POA: Diagnosis not present

## 2019-10-24 DIAGNOSIS — N186 End stage renal disease: Secondary | ICD-10-CM | POA: Diagnosis not present

## 2019-10-24 DIAGNOSIS — Z992 Dependence on renal dialysis: Secondary | ICD-10-CM | POA: Diagnosis not present

## 2019-10-24 DIAGNOSIS — D509 Iron deficiency anemia, unspecified: Secondary | ICD-10-CM | POA: Diagnosis not present

## 2019-10-24 DIAGNOSIS — N2581 Secondary hyperparathyroidism of renal origin: Secondary | ICD-10-CM | POA: Diagnosis not present

## 2019-10-25 DIAGNOSIS — Z992 Dependence on renal dialysis: Secondary | ICD-10-CM | POA: Diagnosis not present

## 2019-10-25 DIAGNOSIS — N186 End stage renal disease: Secondary | ICD-10-CM | POA: Diagnosis not present

## 2019-10-25 DIAGNOSIS — N2581 Secondary hyperparathyroidism of renal origin: Secondary | ICD-10-CM | POA: Diagnosis not present

## 2019-10-25 DIAGNOSIS — D509 Iron deficiency anemia, unspecified: Secondary | ICD-10-CM | POA: Diagnosis not present

## 2019-10-26 DIAGNOSIS — D509 Iron deficiency anemia, unspecified: Secondary | ICD-10-CM | POA: Diagnosis not present

## 2019-10-26 DIAGNOSIS — N186 End stage renal disease: Secondary | ICD-10-CM | POA: Diagnosis not present

## 2019-10-26 DIAGNOSIS — N2581 Secondary hyperparathyroidism of renal origin: Secondary | ICD-10-CM | POA: Diagnosis not present

## 2019-10-26 DIAGNOSIS — Z992 Dependence on renal dialysis: Secondary | ICD-10-CM | POA: Diagnosis not present

## 2019-10-27 DIAGNOSIS — Z992 Dependence on renal dialysis: Secondary | ICD-10-CM | POA: Diagnosis not present

## 2019-10-27 DIAGNOSIS — D509 Iron deficiency anemia, unspecified: Secondary | ICD-10-CM | POA: Diagnosis not present

## 2019-10-27 DIAGNOSIS — N186 End stage renal disease: Secondary | ICD-10-CM | POA: Diagnosis not present

## 2019-10-27 DIAGNOSIS — N2581 Secondary hyperparathyroidism of renal origin: Secondary | ICD-10-CM | POA: Diagnosis not present

## 2019-10-28 DIAGNOSIS — Z992 Dependence on renal dialysis: Secondary | ICD-10-CM | POA: Diagnosis not present

## 2019-10-28 DIAGNOSIS — N186 End stage renal disease: Secondary | ICD-10-CM | POA: Diagnosis not present

## 2019-10-28 DIAGNOSIS — D509 Iron deficiency anemia, unspecified: Secondary | ICD-10-CM | POA: Diagnosis not present

## 2019-10-28 DIAGNOSIS — N2581 Secondary hyperparathyroidism of renal origin: Secondary | ICD-10-CM | POA: Diagnosis not present

## 2019-10-29 DIAGNOSIS — D509 Iron deficiency anemia, unspecified: Secondary | ICD-10-CM | POA: Diagnosis not present

## 2019-10-29 DIAGNOSIS — N2581 Secondary hyperparathyroidism of renal origin: Secondary | ICD-10-CM | POA: Diagnosis not present

## 2019-10-29 DIAGNOSIS — Z992 Dependence on renal dialysis: Secondary | ICD-10-CM | POA: Diagnosis not present

## 2019-10-29 DIAGNOSIS — N186 End stage renal disease: Secondary | ICD-10-CM | POA: Diagnosis not present

## 2019-11-01 DIAGNOSIS — N2581 Secondary hyperparathyroidism of renal origin: Secondary | ICD-10-CM | POA: Diagnosis not present

## 2019-11-01 DIAGNOSIS — N186 End stage renal disease: Secondary | ICD-10-CM | POA: Diagnosis not present

## 2019-11-01 DIAGNOSIS — Z992 Dependence on renal dialysis: Secondary | ICD-10-CM | POA: Diagnosis not present

## 2019-11-01 DIAGNOSIS — D509 Iron deficiency anemia, unspecified: Secondary | ICD-10-CM | POA: Diagnosis not present

## 2019-11-02 DIAGNOSIS — Z992 Dependence on renal dialysis: Secondary | ICD-10-CM | POA: Diagnosis not present

## 2019-11-02 DIAGNOSIS — D509 Iron deficiency anemia, unspecified: Secondary | ICD-10-CM | POA: Diagnosis not present

## 2019-11-02 DIAGNOSIS — N186 End stage renal disease: Secondary | ICD-10-CM | POA: Diagnosis not present

## 2019-11-02 DIAGNOSIS — E119 Type 2 diabetes mellitus without complications: Secondary | ICD-10-CM | POA: Diagnosis not present

## 2019-11-02 DIAGNOSIS — E785 Hyperlipidemia, unspecified: Secondary | ICD-10-CM | POA: Diagnosis not present

## 2019-11-02 DIAGNOSIS — N2581 Secondary hyperparathyroidism of renal origin: Secondary | ICD-10-CM | POA: Diagnosis not present

## 2019-11-03 DIAGNOSIS — D509 Iron deficiency anemia, unspecified: Secondary | ICD-10-CM | POA: Diagnosis not present

## 2019-11-03 DIAGNOSIS — Z992 Dependence on renal dialysis: Secondary | ICD-10-CM | POA: Diagnosis not present

## 2019-11-03 DIAGNOSIS — N186 End stage renal disease: Secondary | ICD-10-CM | POA: Diagnosis not present

## 2019-11-03 DIAGNOSIS — N2581 Secondary hyperparathyroidism of renal origin: Secondary | ICD-10-CM | POA: Diagnosis not present

## 2019-11-04 DIAGNOSIS — D509 Iron deficiency anemia, unspecified: Secondary | ICD-10-CM | POA: Diagnosis not present

## 2019-11-04 DIAGNOSIS — N186 End stage renal disease: Secondary | ICD-10-CM | POA: Diagnosis not present

## 2019-11-04 DIAGNOSIS — N2581 Secondary hyperparathyroidism of renal origin: Secondary | ICD-10-CM | POA: Diagnosis not present

## 2019-11-04 DIAGNOSIS — Z992 Dependence on renal dialysis: Secondary | ICD-10-CM | POA: Diagnosis not present

## 2019-11-05 DIAGNOSIS — Z992 Dependence on renal dialysis: Secondary | ICD-10-CM | POA: Diagnosis not present

## 2019-11-05 DIAGNOSIS — N2581 Secondary hyperparathyroidism of renal origin: Secondary | ICD-10-CM | POA: Diagnosis not present

## 2019-11-05 DIAGNOSIS — D509 Iron deficiency anemia, unspecified: Secondary | ICD-10-CM | POA: Diagnosis not present

## 2019-11-05 DIAGNOSIS — N186 End stage renal disease: Secondary | ICD-10-CM | POA: Diagnosis not present

## 2019-11-07 DIAGNOSIS — N2581 Secondary hyperparathyroidism of renal origin: Secondary | ICD-10-CM | POA: Diagnosis not present

## 2019-11-07 DIAGNOSIS — D509 Iron deficiency anemia, unspecified: Secondary | ICD-10-CM | POA: Diagnosis not present

## 2019-11-07 DIAGNOSIS — N186 End stage renal disease: Secondary | ICD-10-CM | POA: Diagnosis not present

## 2019-11-07 DIAGNOSIS — Z992 Dependence on renal dialysis: Secondary | ICD-10-CM | POA: Diagnosis not present

## 2019-11-08 DIAGNOSIS — Z992 Dependence on renal dialysis: Secondary | ICD-10-CM | POA: Diagnosis not present

## 2019-11-08 DIAGNOSIS — N186 End stage renal disease: Secondary | ICD-10-CM | POA: Diagnosis not present

## 2019-11-08 DIAGNOSIS — N2581 Secondary hyperparathyroidism of renal origin: Secondary | ICD-10-CM | POA: Diagnosis not present

## 2019-11-08 DIAGNOSIS — D509 Iron deficiency anemia, unspecified: Secondary | ICD-10-CM | POA: Diagnosis not present

## 2019-11-09 DIAGNOSIS — N2581 Secondary hyperparathyroidism of renal origin: Secondary | ICD-10-CM | POA: Diagnosis not present

## 2019-11-09 DIAGNOSIS — N186 End stage renal disease: Secondary | ICD-10-CM | POA: Diagnosis not present

## 2019-11-09 DIAGNOSIS — Z992 Dependence on renal dialysis: Secondary | ICD-10-CM | POA: Diagnosis not present

## 2019-11-09 DIAGNOSIS — D509 Iron deficiency anemia, unspecified: Secondary | ICD-10-CM | POA: Diagnosis not present

## 2019-11-10 DIAGNOSIS — D509 Iron deficiency anemia, unspecified: Secondary | ICD-10-CM | POA: Diagnosis not present

## 2019-11-10 DIAGNOSIS — N2581 Secondary hyperparathyroidism of renal origin: Secondary | ICD-10-CM | POA: Diagnosis not present

## 2019-11-10 DIAGNOSIS — Z992 Dependence on renal dialysis: Secondary | ICD-10-CM | POA: Diagnosis not present

## 2019-11-10 DIAGNOSIS — N186 End stage renal disease: Secondary | ICD-10-CM | POA: Diagnosis not present

## 2019-11-11 DIAGNOSIS — D509 Iron deficiency anemia, unspecified: Secondary | ICD-10-CM | POA: Diagnosis not present

## 2019-11-11 DIAGNOSIS — Z992 Dependence on renal dialysis: Secondary | ICD-10-CM | POA: Diagnosis not present

## 2019-11-11 DIAGNOSIS — N2581 Secondary hyperparathyroidism of renal origin: Secondary | ICD-10-CM | POA: Diagnosis not present

## 2019-11-11 DIAGNOSIS — N186 End stage renal disease: Secondary | ICD-10-CM | POA: Diagnosis not present

## 2019-11-12 DIAGNOSIS — Z992 Dependence on renal dialysis: Secondary | ICD-10-CM | POA: Diagnosis not present

## 2019-11-12 DIAGNOSIS — D509 Iron deficiency anemia, unspecified: Secondary | ICD-10-CM | POA: Diagnosis not present

## 2019-11-12 DIAGNOSIS — N2581 Secondary hyperparathyroidism of renal origin: Secondary | ICD-10-CM | POA: Diagnosis not present

## 2019-11-12 DIAGNOSIS — N186 End stage renal disease: Secondary | ICD-10-CM | POA: Diagnosis not present

## 2019-11-14 DIAGNOSIS — D509 Iron deficiency anemia, unspecified: Secondary | ICD-10-CM | POA: Diagnosis not present

## 2019-11-14 DIAGNOSIS — N186 End stage renal disease: Secondary | ICD-10-CM | POA: Diagnosis not present

## 2019-11-14 DIAGNOSIS — N2581 Secondary hyperparathyroidism of renal origin: Secondary | ICD-10-CM | POA: Diagnosis not present

## 2019-11-14 DIAGNOSIS — Z992 Dependence on renal dialysis: Secondary | ICD-10-CM | POA: Diagnosis not present

## 2019-11-15 DIAGNOSIS — Z992 Dependence on renal dialysis: Secondary | ICD-10-CM | POA: Diagnosis not present

## 2019-11-15 DIAGNOSIS — N186 End stage renal disease: Secondary | ICD-10-CM | POA: Diagnosis not present

## 2019-11-15 DIAGNOSIS — D509 Iron deficiency anemia, unspecified: Secondary | ICD-10-CM | POA: Diagnosis not present

## 2019-11-15 DIAGNOSIS — N2581 Secondary hyperparathyroidism of renal origin: Secondary | ICD-10-CM | POA: Diagnosis not present

## 2019-11-16 DIAGNOSIS — N186 End stage renal disease: Secondary | ICD-10-CM | POA: Diagnosis not present

## 2019-11-16 DIAGNOSIS — Z992 Dependence on renal dialysis: Secondary | ICD-10-CM | POA: Diagnosis not present

## 2019-11-16 DIAGNOSIS — D509 Iron deficiency anemia, unspecified: Secondary | ICD-10-CM | POA: Diagnosis not present

## 2019-11-16 DIAGNOSIS — N2581 Secondary hyperparathyroidism of renal origin: Secondary | ICD-10-CM | POA: Diagnosis not present

## 2019-11-17 DIAGNOSIS — D509 Iron deficiency anemia, unspecified: Secondary | ICD-10-CM | POA: Diagnosis not present

## 2019-11-17 DIAGNOSIS — N186 End stage renal disease: Secondary | ICD-10-CM | POA: Diagnosis not present

## 2019-11-17 DIAGNOSIS — N2581 Secondary hyperparathyroidism of renal origin: Secondary | ICD-10-CM | POA: Diagnosis not present

## 2019-11-17 DIAGNOSIS — Z992 Dependence on renal dialysis: Secondary | ICD-10-CM | POA: Diagnosis not present

## 2019-11-18 DIAGNOSIS — N2581 Secondary hyperparathyroidism of renal origin: Secondary | ICD-10-CM | POA: Diagnosis not present

## 2019-11-18 DIAGNOSIS — N186 End stage renal disease: Secondary | ICD-10-CM | POA: Diagnosis not present

## 2019-11-18 DIAGNOSIS — D509 Iron deficiency anemia, unspecified: Secondary | ICD-10-CM | POA: Diagnosis not present

## 2019-11-18 DIAGNOSIS — Z992 Dependence on renal dialysis: Secondary | ICD-10-CM | POA: Diagnosis not present

## 2019-11-19 DIAGNOSIS — D509 Iron deficiency anemia, unspecified: Secondary | ICD-10-CM | POA: Diagnosis not present

## 2019-11-19 DIAGNOSIS — N2581 Secondary hyperparathyroidism of renal origin: Secondary | ICD-10-CM | POA: Diagnosis not present

## 2019-11-19 DIAGNOSIS — N186 End stage renal disease: Secondary | ICD-10-CM | POA: Diagnosis not present

## 2019-11-19 DIAGNOSIS — Z992 Dependence on renal dialysis: Secondary | ICD-10-CM | POA: Diagnosis not present

## 2019-11-20 DIAGNOSIS — N186 End stage renal disease: Secondary | ICD-10-CM | POA: Diagnosis not present

## 2019-11-20 DIAGNOSIS — Z992 Dependence on renal dialysis: Secondary | ICD-10-CM | POA: Diagnosis not present

## 2019-11-21 DIAGNOSIS — Z992 Dependence on renal dialysis: Secondary | ICD-10-CM | POA: Diagnosis not present

## 2019-11-21 DIAGNOSIS — N186 End stage renal disease: Secondary | ICD-10-CM | POA: Diagnosis not present

## 2019-11-21 DIAGNOSIS — N2581 Secondary hyperparathyroidism of renal origin: Secondary | ICD-10-CM | POA: Diagnosis not present

## 2019-11-21 DIAGNOSIS — D509 Iron deficiency anemia, unspecified: Secondary | ICD-10-CM | POA: Diagnosis not present

## 2019-11-22 DIAGNOSIS — N2581 Secondary hyperparathyroidism of renal origin: Secondary | ICD-10-CM | POA: Diagnosis not present

## 2019-11-22 DIAGNOSIS — Z992 Dependence on renal dialysis: Secondary | ICD-10-CM | POA: Diagnosis not present

## 2019-11-22 DIAGNOSIS — D509 Iron deficiency anemia, unspecified: Secondary | ICD-10-CM | POA: Diagnosis not present

## 2019-11-22 DIAGNOSIS — N186 End stage renal disease: Secondary | ICD-10-CM | POA: Diagnosis not present

## 2019-11-23 DIAGNOSIS — Z992 Dependence on renal dialysis: Secondary | ICD-10-CM | POA: Diagnosis not present

## 2019-11-23 DIAGNOSIS — N186 End stage renal disease: Secondary | ICD-10-CM | POA: Diagnosis not present

## 2019-11-23 DIAGNOSIS — D509 Iron deficiency anemia, unspecified: Secondary | ICD-10-CM | POA: Diagnosis not present

## 2019-11-23 DIAGNOSIS — N2581 Secondary hyperparathyroidism of renal origin: Secondary | ICD-10-CM | POA: Diagnosis not present

## 2019-11-24 DIAGNOSIS — N186 End stage renal disease: Secondary | ICD-10-CM | POA: Diagnosis not present

## 2019-11-24 DIAGNOSIS — Z992 Dependence on renal dialysis: Secondary | ICD-10-CM | POA: Diagnosis not present

## 2019-11-24 DIAGNOSIS — D509 Iron deficiency anemia, unspecified: Secondary | ICD-10-CM | POA: Diagnosis not present

## 2019-11-24 DIAGNOSIS — N2581 Secondary hyperparathyroidism of renal origin: Secondary | ICD-10-CM | POA: Diagnosis not present

## 2019-11-25 DIAGNOSIS — N2581 Secondary hyperparathyroidism of renal origin: Secondary | ICD-10-CM | POA: Diagnosis not present

## 2019-11-25 DIAGNOSIS — D509 Iron deficiency anemia, unspecified: Secondary | ICD-10-CM | POA: Diagnosis not present

## 2019-11-25 DIAGNOSIS — Z992 Dependence on renal dialysis: Secondary | ICD-10-CM | POA: Diagnosis not present

## 2019-11-25 DIAGNOSIS — N186 End stage renal disease: Secondary | ICD-10-CM | POA: Diagnosis not present

## 2019-11-26 DIAGNOSIS — D509 Iron deficiency anemia, unspecified: Secondary | ICD-10-CM | POA: Diagnosis not present

## 2019-11-26 DIAGNOSIS — N2581 Secondary hyperparathyroidism of renal origin: Secondary | ICD-10-CM | POA: Diagnosis not present

## 2019-11-26 DIAGNOSIS — Z992 Dependence on renal dialysis: Secondary | ICD-10-CM | POA: Diagnosis not present

## 2019-11-26 DIAGNOSIS — N186 End stage renal disease: Secondary | ICD-10-CM | POA: Diagnosis not present

## 2019-11-28 DIAGNOSIS — D509 Iron deficiency anemia, unspecified: Secondary | ICD-10-CM | POA: Diagnosis not present

## 2019-11-28 DIAGNOSIS — N186 End stage renal disease: Secondary | ICD-10-CM | POA: Diagnosis not present

## 2019-11-28 DIAGNOSIS — Z79899 Other long term (current) drug therapy: Secondary | ICD-10-CM | POA: Diagnosis not present

## 2019-11-28 DIAGNOSIS — Z992 Dependence on renal dialysis: Secondary | ICD-10-CM | POA: Diagnosis not present

## 2019-11-28 DIAGNOSIS — N2581 Secondary hyperparathyroidism of renal origin: Secondary | ICD-10-CM | POA: Diagnosis not present

## 2019-11-29 DIAGNOSIS — N186 End stage renal disease: Secondary | ICD-10-CM | POA: Diagnosis not present

## 2019-11-29 DIAGNOSIS — D509 Iron deficiency anemia, unspecified: Secondary | ICD-10-CM | POA: Diagnosis not present

## 2019-11-29 DIAGNOSIS — N2581 Secondary hyperparathyroidism of renal origin: Secondary | ICD-10-CM | POA: Diagnosis not present

## 2019-11-29 DIAGNOSIS — Z992 Dependence on renal dialysis: Secondary | ICD-10-CM | POA: Diagnosis not present

## 2019-11-30 DIAGNOSIS — Z992 Dependence on renal dialysis: Secondary | ICD-10-CM | POA: Diagnosis not present

## 2019-11-30 DIAGNOSIS — N186 End stage renal disease: Secondary | ICD-10-CM | POA: Diagnosis not present

## 2019-11-30 DIAGNOSIS — D509 Iron deficiency anemia, unspecified: Secondary | ICD-10-CM | POA: Diagnosis not present

## 2019-11-30 DIAGNOSIS — N2581 Secondary hyperparathyroidism of renal origin: Secondary | ICD-10-CM | POA: Diagnosis not present

## 2019-12-17 ENCOUNTER — Other Ambulatory Visit: Payer: Self-pay | Admitting: Nurse Practitioner

## 2019-12-19 DEATH — deceased
